# Patient Record
Sex: Female | Born: 1985 | Race: Asian | Hispanic: No | Marital: Single | Smoking: Never smoker
Health system: Southern US, Community
[De-identification: ages and names within clinical notes are randomized; demographics above are authoritative.]

## PROBLEM LIST (undated history)

## (undated) VITALS — BP 109/68 | HR 89 | Temp 97.9°F | Resp 16 | Ht 60.0 in | Wt 98.0 lb

## (undated) DIAGNOSIS — R3989 Other symptoms and signs involving the genitourinary system: Secondary | ICD-10-CM

## (undated) DIAGNOSIS — R300 Dysuria: Secondary | ICD-10-CM

## (undated) DIAGNOSIS — Z8659 Personal history of other mental and behavioral disorders: Secondary | ICD-10-CM

## (undated) DIAGNOSIS — F419 Anxiety disorder, unspecified: Secondary | ICD-10-CM

## (undated) DIAGNOSIS — F329 Major depressive disorder, single episode, unspecified: Secondary | ICD-10-CM

## (undated) DIAGNOSIS — N390 Urinary tract infection, site not specified: Secondary | ICD-10-CM

## (undated) DIAGNOSIS — B379 Candidiasis, unspecified: Secondary | ICD-10-CM

## (undated) DIAGNOSIS — Z8742 Personal history of other diseases of the female genital tract: Secondary | ICD-10-CM

## (undated) DIAGNOSIS — E559 Vitamin D deficiency, unspecified: Secondary | ICD-10-CM

## (undated) DIAGNOSIS — Z8719 Personal history of other diseases of the digestive system: Secondary | ICD-10-CM

## (undated) DIAGNOSIS — K219 Gastro-esophageal reflux disease without esophagitis: Secondary | ICD-10-CM

## (undated) DIAGNOSIS — R3915 Urgency of urination: Secondary | ICD-10-CM

## (undated) DIAGNOSIS — IMO0002 Reserved for concepts with insufficient information to code with codable children: Secondary | ICD-10-CM

## (undated) HISTORY — DX: Gastro-esophageal reflux disease without esophagitis: K21.9

## (undated) HISTORY — DX: Vitamin D deficiency, unspecified: E55.9

---

## 2012-07-23 ENCOUNTER — Encounter (HOSPITAL_COMMUNITY): Payer: Self-pay | Admitting: Emergency Medicine

## 2012-07-23 ENCOUNTER — Emergency Department (HOSPITAL_COMMUNITY)
Admission: EM | Admit: 2012-07-23 | Discharge: 2012-07-24 | Disposition: A | Discharge status: Home / Self Care | Payer: BC Managed Care – PPO | Attending: Emergency Medicine | Admitting: Emergency Medicine

## 2012-07-23 DIAGNOSIS — F329 Major depressive disorder, single episode, unspecified: Secondary | ICD-10-CM | POA: Insufficient documentation

## 2012-07-23 DIAGNOSIS — R45851 Suicidal ideations: Secondary | ICD-10-CM | POA: Insufficient documentation

## 2012-07-23 DIAGNOSIS — F3289 Other specified depressive episodes: Secondary | ICD-10-CM | POA: Insufficient documentation

## 2012-07-23 HISTORY — DX: Anxiety disorder, unspecified: F41.9

## 2012-07-23 LAB — CBC
MCH: 30.2 pg (ref 26.0–34.0)
MCHC: 33.8 g/dL (ref 30.0–36.0)
MCV: 89.3 fL (ref 78.0–100.0)
Platelets: 268 10*3/uL (ref 150–400)
RDW: 12.1 % (ref 11.5–15.5)

## 2012-07-23 LAB — COMPREHENSIVE METABOLIC PANEL
AST: 14 U/L (ref 0–37)
Albumin: 4.5 g/dL (ref 3.5–5.2)
CO2: 25 mEq/L (ref 19–32)
Calcium: 9.7 mg/dL (ref 8.4–10.5)
Creatinine, Ser: 0.56 mg/dL (ref 0.50–1.10)
GFR calc non Af Amer: >90 mL/min (ref >90)
Sodium: 137 mEq/L (ref 135–145)
Total Protein: 7.8 g/dL (ref 6.0–8.3)

## 2012-07-23 LAB — RAPID URINE DRUG SCREEN, HOSP PERFORMED
Amphetamines: NOT DETECTED
Cocaine: NOT DETECTED
Opiates: NOT DETECTED

## 2012-07-23 LAB — SALICYLATE LEVEL: Salicylate Lvl: <2 mg/dL — ABNORMAL LOW (ref 2.8–20.0)

## 2012-07-23 MED ORDER — ZOLPIDEM TARTRATE 5 MG PO TABS: 5.0000 mg | ORAL_TABLET | Freq: Every evening | ORAL | Status: DC | PRN

## 2012-07-23 MED ORDER — ACETAMINOPHEN 325 MG PO TABS: 650.0000 mg | ORAL_TABLET | ORAL | Status: DC | PRN

## 2012-07-23 MED ORDER — LORAZEPAM 1 MG PO TABS: 1.0000 mg | ORAL_TABLET | Freq: Three times a day (TID) | ORAL | Status: DC | PRN

## 2012-07-23 MED ORDER — ALUM & MAG HYDROXIDE-SIMETH 200-200-20 MG/5ML PO SUSP: 30.0000 mL | ORAL | Status: DC | PRN

## 2012-07-23 MED ORDER — ONDANSETRON HCL 8 MG PO TABS: 4.0000 mg | ORAL_TABLET | Freq: Three times a day (TID) | ORAL | Status: DC | PRN

## 2012-07-23 NOTE — ED Notes (Signed)
Charge RN and [facility] notified that pt is SI; AC states she does not have sitter currently, but would probably have one at 11pm

## 2012-07-23 NOTE — ED Notes (Signed)
Spoke with Kathline Magic PA about pt; okay'ed pt to go to [facility] for screening

## 2012-07-23 NOTE — ED Notes (Signed)
Pt in blue scrubs; security paged to wand pt

## 2012-07-23 NOTE — BH Assessment (Signed)
Assessment Note   Dawn Price is an 26 y.o. female. Pt is from [place] and is a Gaffer at [employer].  Pt has been in [place] for 2 years, cannot return to her homeland until she graduates in 2016.  Pt reports her boyfriend broke up with her recently.  Also states she has been very homesick recently--much worse than when she first arrived.  Pt states things have not been going well for about a month and she has been having SI for past 2 weeks.  Trouble eating and sleeping.  Pt reports she had a bottle of advil today and was intending to take the whole bottle to kill herself.  She took 2 pills and then decided she would go talk to her boyfriend once more.  boyffriend was at work but she told a friend, who took her to [facility], who sent her to [facility] ED.  Pt also reports she has had impulses to cut herself recently but has not acted on them.  PT was on prozac throught [employer] but stopped taking them in June.  Pt denies HI/AV.  Axis I: Major Depression, single episode Axis II: Deferred Axis III:  Past Medical History  Diagnosis Date  . Anxiety    Axis IV: problems with primary support group Axis V: 31-40 impairment in reality testing  Past Medical History:  Past Medical History  Diagnosis Date  . Anxiety     History reviewed. No pertinent past surgical history.  Family History: History reviewed. No pertinent family history.  Social History:  reports that she has never smoked. She does not have any smokeless tobacco history on file. She reports that she drinks alcohol. She reports that she does not use illicit drugs.  Additional Social History:  Alcohol / Drug Use Pain Medications: Pt denies Prescriptions: Pt denies Over the Counter: Pt denies History of alcohol / drug use?: Yes Substance #1 Name of Substance 1: alcohol 1 - Age of First Use: 26 1 - Amount (size/oz): 1 drink 1 - Frequency: most weekends 1 - Last Use / Amount: 9/28, 1 drink  CIWA: CIWA-Ar BP: 109/66  mmHg Pulse Rate: 72  COWS:    Allergies: No Known Allergies  Home Medications:  (Not in a hospital admission)  OB/GYN Status:  Patient's last menstrual period was 06/21/2012.  General Assessment Data Location of Assessment: [facility] ED ACT Assessment: Yes Living Arrangements: Alone Can pt return to current living arrangement?: Yes Admission Status: Voluntary Is patient capable of signing voluntary admission?: Yes Transfer from: Acute Hospital  Education Status Is patient currently in school?: Yes Current Grade: IT consultant Name of school: [employer]  Risk to self Suicidal Ideation: Yes-Currently Present Suicidal Intent: Yes-Currently Present Is patient at risk for suicide?: Yes Suicidal Plan?: Yes-Currently Present Specify Current Suicidal Plan: OD Access to Means: Yes Specify Access to Suicidal Means: own pills What has been your use of drugs/alcohol within the last 12 months?: Current use of alcohol Previous Attempts/Gestures: No Intentional Self Injurious Behavior: None (Pt reports current thoughts of cutting, no hx of cutting) Family Suicide History: No Recent stressful life event(s): Loss (Comment) (boyfriend broke up with her recently, homesick) Persecutory voices/beliefs?: No Depression: Yes Depression Symptoms: Despondent;Insomnia;Tearfulness;Isolating;Fatigue;Guilt;Loss of interest in usual pleasures;Feeling worthless/self pity Substance abuse history and/or treatment for substance abuse?: No Suicide prevention information given to non-admitted patients: Not applicable  Risk to Others Homicidal Ideation: No Thoughts of Harm to Others: No Current Homicidal Intent: No Current Homicidal Plan: No Access to Homicidal  Means: No History of harm to others?: No Assessment of Violence: None Noted Does patient have access to weapons?: No Criminal Charges Pending?: No Does patient have a court date: No  Psychosis Hallucinations: None noted Delusions: None noted  Mental  Status Report Appear/Hygiene: Other (Comment) (casual) Eye Contact: Good Motor Activity: Unremarkable Speech: Logical/coherent Level of Consciousness: Alert;Quiet/awake Mood: Depressed Affect: Appropriate to circumstance Anxiety Level: Minimal Thought Processes: Coherent;Relevant Judgement: Unimpaired Orientation: Person;Place;Time;Situation Obsessive Compulsive Thoughts/Behaviors: None  Cognitive Functioning Concentration: Normal Memory: Recent Intact;Remote Intact IQ: Above Average Insight: Good Impulse Control: Fair Appetite: Poor Weight Loss: 6  Weight Gain: 0  Sleep: Decreased Total Hours of Sleep: 5  Vegetative Symptoms: None  ADLScreening [facility] Assessment Services) Patient's cognitive ability adequate to safely complete daily activities?: Yes Patient able to express need for assistance with ADLs?: Yes Independently performs ADLs?: Yes (appropriate for developmental age)  Abuse/Neglect [facility]) Physical Abuse: Denies Verbal Abuse: Denies Sexual Abuse: Denies  Prior Inpatient Therapy Prior Inpatient Therapy: No  Prior Outpatient Therapy Prior Outpatient Therapy: Yes Prior Therapy Dates: 2013 Prior Therapy Facilty/Provider(s): [facility] Reason for Treatment: depression  ADL Screening (condition at time of admission) Patient's cognitive ability adequate to safely complete daily activities?: Yes Patient able to express need for assistance with ADLs?: Yes Independently performs ADLs?: Yes (appropriate for developmental age) Weakness of Legs: None Weakness of Arms/Hands: None  Home Assistive Devices/Equipment Home Assistive Devices/Equipment: None    Abuse/Neglect Assessment (Assessment to be complete while patient is alone) Physical Abuse: Denies Verbal Abuse: Denies Sexual Abuse: Denies Exploitation of patient/patient's resources: Denies Self-Neglect: Denies     Merchant navy officer (For Healthcare) Advance Directive: Patient does not have advance  directive;Patient would not like information    Additional Information 1:1 In Past 12 Months?: No CIRT Risk: No Elopement Risk: No Does patient have medical clearance?: Yes     Disposition: Pt referred for inpt psych treatment to [facility]. Disposition Disposition of Patient: Inpatient treatment program Type of inpatient treatment program: Adult  On Site Evaluation by:   Reviewed with Physician:     Lorri Frederick 07/23/2012 11:06 PM

## 2012-07-23 NOTE — ED Notes (Addendum)
Pt reports SI thoughts today, took 2 advil pills to try and harm herself; stopped at the 2; went to [facility] health services and sent her here

## 2012-07-23 NOTE — ED Provider Notes (Signed)
History     CSN: [id number]  Arrival date & time 07/23/12  4098   First MD Initiated Contact with Patient 07/23/12 2055      Chief Complaint  Patient presents with  . Suicidal   HPI  History provided by the patient. Patient is a 26 year old female who presents with increased depression and suicidal ideations. 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 Patient reports having increased depression for the past one month after she separated from her boyfriend. Patient reports having increased aggression and sadness. Today she was is overwhelmed with stress and sadness and had strong suicidal thoughts. Patient reports that she plan to take a whole bottle of Advil and began to take this but stopped after taking 2 pills and called her friend. Patient does report prior history of anxiety and previously being on Prozac but she discontinued this on her own last May or June because she felt well.    Past Medical History  Diagnosis Date  . Anxiety     History reviewed. No pertinent past surgical history.  History reviewed. No pertinent family history.  History  Substance Use Topics  . Smoking status: Never Smoker   . Smokeless tobacco: Not on file  . Alcohol Use: Yes     occasion    OB History    Grav Para Term Preterm Abortions TAB SAB Ect Mult Living                  Review of Systems  Constitutional: Negative for fever, chills and diaphoresis.  Respiratory: Negative for cough and shortness of breath.   Cardiovascular: Negative for chest pain and palpitations.  Psychiatric/Behavioral: Positive for suicidal ideas and dysphoric mood. Negative for hallucinations. The patient is not nervous/anxious.     Allergies  Review of patient's allergies indicates no known allergies.  Home Medications   Current Outpatient Rx  Name Route Sig Dispense Refill  . ADULT MULTIVITAMIN W/MINERALS CH Oral Take 1 tablet by mouth daily.      BP  109/66  Pulse 72  Temp 98.5 F (36.9 C) (Oral)  Resp 18  SpO2 98%  LMP 06/21/2012  Physical Exam  Nursing note and vitals reviewed. Constitutional: She is oriented to person, place, and time. She appears well-developed and well-nourished. No distress.  HENT:  Head: Normocephalic and atraumatic.  Cardiovascular: Normal rate and regular rhythm.   Pulmonary/Chest: Effort normal and breath sounds normal. No respiratory distress. She has no wheezes. She has no rales.  Abdominal: Soft. There is no tenderness. There is no rebound.  Musculoskeletal: She exhibits no edema and no tenderness.  Neurological: She is alert and oriented to person, place, and time.  Skin: Skin is warm and dry.  Psychiatric: Her behavior is normal. She exhibits a depressed mood. She expresses suicidal ideation. She expresses no homicidal ideation.    ED Course  Procedures    Results for orders placed during the hospital encounter of 07/23/12  ACETAMINOPHEN LEVEL      Component Value Range   Acetaminophen (Tylenol), Serum <15.0  10 - 30 ug/mL  CBC      Component Value Range   WBC 7.2  4.0 - 10.5 K/uL   RBC 4.60  3.87 - 5.11 MIL/uL   Hemoglobin 13.9  12.0 - 15.0 g/dL   HCT 11.9  14.7 - 82.9 %   MCV 89.3  78.0 - 100.0 fL   MCH 30.2  26.0 -  34.0 pg   MCHC 33.8  30.0 - 36.0 g/dL   RDW 16.1  09.6 - 04.5 %   Platelets 268  150 - 400 K/uL  COMPREHENSIVE METABOLIC PANEL      Component Value Range   Sodium 137  135 - 145 mEq/L   Potassium 3.8  3.5 - 5.1 mEq/L   Chloride 100  96 - 112 mEq/L   CO2 25  19 - 32 mEq/L   Glucose, Bld 91  70 - 99 mg/dL   BUN 9  6 - 23 mg/dL   Creatinine, Ser 4.09  0.50 - 1.10 mg/dL   Calcium 9.7  8.4 - 81.1 mg/dL   Total Protein 7.8  6.0 - 8.3 g/dL   Albumin 4.5  3.5 - 5.2 g/dL   AST 14  0 - 37 U/L   ALT 13  0 - 35 U/L   Alkaline Phosphatase 66  39 - 117 U/L   Total Bilirubin 1.1  0.3 - 1.2 mg/dL   GFR calc non Af Amer >90  >90 mL/min   GFR calc Af Amer >90  >90 mL/min    ETHANOL      Component Value Range   Alcohol, Ethyl (B) <11  0 - 11 mg/dL  SALICYLATE LEVEL      Component Value Range   Salicylate Lvl <2.0 (*) 2.8 - 20.0 mg/dL  URINE RAPID DRUG SCREEN (HOSP PERFORMED)      Component Value Range   Opiates NONE DETECTED  NONE DETECTED   Cocaine NONE DETECTED  NONE DETECTED   Benzodiazepines NONE DETECTED  NONE DETECTED   Amphetamines NONE DETECTED  NONE DETECTED   Tetrahydrocannabinol NONE DETECTED  NONE DETECTED   Barbiturates NONE DETECTED  NONE DETECTED  POCT PREGNANCY, URINE      Component Value Range   Preg Test, Ur NEGATIVE  NEGATIVE     1. Suicidal ideation   2. Depression       MDM  10:15 PM patient seen and evaluated. Patient continues to feel depressed and suicidal ideation.  Pt moved to pod C. Psych holding orders in place.  Spoke with Tammy Sours with [facility] act team.  He will evaluated for placement.  Pt is voluntary.       Angus Seller, [place] 07/24/12 [phone]

## 2012-07-24 ENCOUNTER — Ambulatory Visit (HOSPITAL_COMMUNITY): Admission: AD | Admit: 2012-07-24 | Payer: Self-pay | Source: Ambulatory Visit | Admitting: Psychiatry

## 2012-07-24 NOTE — ED Notes (Signed)
Report called to [facility].

## 2012-07-24 NOTE — ED Notes (Signed)
[facility] called for transport.  

## 2012-07-24 NOTE — ED Notes (Signed)
[facility] here to pick up pt.  All of pt.'s belongings giving to [facility] for Transport to [facility].

## 2012-07-24 NOTE — ED Provider Notes (Signed)
Medical screening examination/treatment/procedure(s) were performed by non-physician practitioner and as supervising physician I was immediately available for consultation/collaboration.  Charles B. Sheldon, MD 07/24/12 1349 

## 2012-07-24 NOTE — [facility] Counselor (Signed)
Patient accepted by D. Sharpe NP @ [facility] pending bed availability. 

## 2013-04-20 ENCOUNTER — Emergency Department (HOSPITAL_COMMUNITY)
Admission: EM | Admit: 2013-04-20 | Discharge: 2013-04-20 | Disposition: A | Discharge status: Home / Self Care | Payer: BC Managed Care – PPO | Source: Home / Self Care

## 2013-04-20 ENCOUNTER — Encounter (HOSPITAL_COMMUNITY): Payer: Self-pay

## 2013-04-20 DIAGNOSIS — R21 Rash and other nonspecific skin eruption: Secondary | ICD-10-CM

## 2013-04-20 NOTE — ED Provider Notes (Signed)
   History    CSN: [id number] Arrival date & time 04/20/13  1523  First MD Initiated Contact with Patient 04/20/13 1751     Chief Complaint  Patient presents with  . Rash   (Consider location/radiation/quality/duration/timing/severity/associated sxs/prior Treatment) HPI Comments: 27 year old female presents with concerns of small flesh-colored papules on small portions of her torso and extremities. She states there is no soreness or itching. They began approximately 6 days ago. 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 She denies any known exposure she was wearing personnel protection equipment and double blood. There was no accident in the laboratory. She has no other feelings of illness. She has no fever, chills or other constitutional symptoms. No GI symptoms no chest pain, shortness of breath, lack of energy, fatigue or malaise. She says she feels well.  Past Medical History  Diagnosis Date  . Anxiety    History reviewed. No pertinent past surgical history. History reviewed. No pertinent family history. History  Substance Use Topics  . Smoking status: Never Smoker   . Smokeless tobacco: Not on file  . Alcohol Use: Yes     Comment: occasion   OB History   Grav Para Term Preterm Abortions TAB SAB Ect Mult Living                 Review of Systems  All other systems reviewed and are negative.    Allergies  Review of patient's allergies indicates no known allergies.  Home Medications   Current Outpatient Rx  Name  Route  Sig  Dispense  Refill  . Multiple Vitamin (MULTIVITAMIN WITH MINERALS) TABS   Oral   Take 1 tablet by mouth daily.          BP 118/71  Pulse 77  Temp(Src) 98.3 F (36.8 C) (Oral)  Resp 16  SpO2 100% Physical Exam  Nursing note and vitals reviewed. Constitutional: She is oriented to person, place, and time. She appears well-developed and well-nourished. No distress.  Eyes: Conjunctivae and EOM are  normal.  Neck: Normal range of motion. Neck supple.  Cardiovascular: Normal rate and regular rhythm.   Pulmonary/Chest: Effort normal and breath sounds normal.  Musculoskeletal: She exhibits no edema and no tenderness.  Neurological: She is alert and oriented to person, place, and time. She exhibits normal muscle tone.  Skin: Skin is warm and dry.  1-2 mm flesh-colored to slightly red papules scattered to the upper extremities and torso. Very low density in numbers of lesions. Nontender. No evidence of infection.  Psychiatric: She has a normal mood and affect.    ED Course  Procedures (including critical care time) Labs Reviewed - No data to display No results found. 1. Rash and nonspecific skin eruption     MDM  The patient is completely asymptomatic and feels well in general only complaint is the tiny 1 mm papules scattered in small areas of the trunk and lesser to the extremities. Tried to reassure her that this was most likely not due to a serious condition, although I am uncertain as to the etiology. One possibility is that it is a type of viral exanthem without additional clinical symptoms. If she develops increasing rash or associated symptoms such as fever, chills, myalgias or other symptoms may return.  Hayden Rasmussen, NP 04/20/13 2109

## 2013-04-20 NOTE — ED Notes (Signed)
States she has a generalized rash on legs, arms, trunk

## 2013-04-20 NOTE — ED Provider Notes (Signed)
Medical screening examination/treatment/procedure(s) were performed by non-physician practitioner and as supervising physician I was immediately available for consultation/collaboration.  Taden Witter, M.D.  Kamorah Nevils C Jordynn Perrier, MD 04/20/13 2132 

## 2014-05-13 ENCOUNTER — Ambulatory Visit (INDEPENDENT_AMBULATORY_CARE_PROVIDER_SITE_OTHER): Payer: BC Managed Care – PPO | Admitting: Family Medicine

## 2014-05-13 VITALS — BP 102/68 | HR 71 | Temp 98.2°F | Resp 16 | Ht 59.5 in | Wt 90.0 lb

## 2014-05-13 DIAGNOSIS — R634 Abnormal weight loss: Secondary | ICD-10-CM

## 2014-05-13 DIAGNOSIS — G4700 Insomnia, unspecified: Secondary | ICD-10-CM

## 2014-05-13 NOTE — Patient Instructions (Addendum)
Try to get more regular exercise  Try to eat more calories  Try to get some sun.  Avoid the evening tea.  I will let you know the results of the lab in a few days or week  Return as necessary  Insomnia Insomnia is frequent trouble falling and/or staying asleep. Insomnia can be a long term problem or a short term problem. Both are common. Insomnia can be a short term problem when the wakefulness is related to a certain stress or worry. Long term insomnia is often related to ongoing stress during waking hours and/or poor sleeping habits. Overtime, sleep deprivation itself can make the problem worse. Every little thing feels more severe because you are overtired and your ability to cope is decreased. CAUSES   Stress, anxiety, and depression.  Poor sleeping habits.  Distractions such as TV in the bedroom.  Naps close to bedtime.  Engaging in emotionally charged conversations before bed.  Technical reading before sleep.  Alcohol and other sedatives. They may make the problem worse. They can hurt normal sleep patterns and normal dream activity.  Stimulants such as caffeine for several hours prior to bedtime.  Pain syndromes and shortness of breath can cause insomnia.  Exercise late at night.  Changing time zones may cause sleeping problems (jet lag). It is sometimes helpful to have someone observe your sleeping patterns. They should look for periods of not breathing during the night (sleep apnea). They should also look to see how long those periods last. If you live alone or observers are uncertain, you can also be observed at a sleep clinic where your sleep patterns will be professionally monitored. Sleep apnea requires a checkup and treatment. Give your caregivers your medical history. Give your caregivers observations your family has made about your sleep.  SYMPTOMS   Not feeling rested in the morning.  Anxiety and restlessness at bedtime.  Difficulty falling and staying  asleep. TREATMENT   Your caregiver may prescribe treatment for an underlying medical disorders. Your caregiver can give advice or help if you are using alcohol or other drugs for self-medication. Treatment of underlying problems will usually eliminate insomnia problems.  Medications can be prescribed for short time use. They are generally not recommended for lengthy use.  Over-the-counter sleep medicines are not recommended for lengthy use. They can be habit forming.  You can promote easier sleeping by making lifestyle changes such as:  Using relaxation techniques that help with breathing and reduce muscle tension.  Exercising earlier in the day.  Changing your diet and the time of your last meal. No night time snacks.  Establish a regular time to go to bed.  Counseling can help with stressful problems and worry.  Soothing music and white noise may be helpful if there are background noises you cannot remove.  Stop tedious detailed work at least one hour before bedtime. HOME CARE INSTRUCTIONS   Keep a diary. Inform your caregiver about your progress. This includes any medication side effects. See your caregiver regularly. Take note of:  Times when you are asleep.  Times when you are awake during the night.  The quality of your sleep.  How you feel the next day. This information will help your caregiver care for you.  Get out of bed if you are still awake after 15 minutes. Read or do some quiet activity. Keep the lights down. Wait until you feel sleepy and go back to bed.  Keep regular sleeping and waking hours. Avoid naps.  Exercise  regularly.  Avoid distractions at bedtime. Distractions include watching television or engaging in any intense or detailed activity like attempting to balance the household checkbook.  Develop a bedtime ritual. Keep a familiar routine of bathing, brushing your teeth, climbing into bed at the same time each night, listening to soothing music.  Routines increase the success of falling to sleep faster.  Use relaxation techniques. This can be using breathing and muscle tension release routines. It can also include visualizing peaceful scenes. You can also help control troubling or intruding thoughts by keeping your mind occupied with boring or repetitive thoughts like the old concept of counting sheep. You can make it more creative like imagining planting one beautiful flower after another in your backyard garden.  During your day, work to eliminate stress. When this is not possible use some of the previous suggestions to help reduce the anxiety that accompanies stressful situations. MAKE SURE YOU:   Understand these instructions.  Will watch your condition.  Will get help right away if you are not doing well or get worse. Document Released: 10/08/2000 Document Revised: 01/03/2012 Document Reviewed: 11/08/2007 [facility] Patient Information 2015 [place], [place]. This information is not intended to replace advice given to you by your health care provider. Make sure you discuss any questions you have with your health care provider.

## 2014-05-13 NOTE — Progress Notes (Signed)
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 The school clinic as for her thyroid test was a little elevated. She is here for recheck of that. She preferred to come to a private physician writhing going back to the school clinic. She also has lost some weight. She used to weigh 79, now is down to 90 pounds. She doesn't have as good an appetite as she should she says. She has not missed well. She goes to bed about 1 or 2 doesn't go to sleep until 3 or 4. Does not get any exercise. Does not get any sun. Was told that her vitamin D level was low about 3 months ago and was given some vitamin D. She takes multivitamin, no other regular medications. She is no longer sexually involved.last menstrual cycle was 4 weeks ago.  Objective Pleasant young lady, slender but short statured. TMs are normal. Eyes normal. Throat clear. Neck supple without nodes. No thyromegaly. Chest clear to auscultation. Heart regular without murmurs gallops or arrhythmias. No carotid bruits. Abdomen soft without organomegaly mass or tenderness. Extremities unremarkable.  Assessment: Insomnia Weight loss  Plan: Thyroid panel and C. met

## 2014-05-14 ENCOUNTER — Encounter: Payer: Self-pay | Admitting: Family Medicine

## 2014-05-14 LAB — COMPREHENSIVE METABOLIC PANEL
ALK PHOS: 67 U/L (ref 39–117)
ALT: 66 U/L — AB (ref 0–35)
AST: 34 U/L (ref 0–37)
Albumin: 4.7 g/dL (ref 3.5–5.2)
BUN: 9 mg/dL (ref 6–23)
CALCIUM: 9.5 mg/dL (ref 8.4–10.5)
CHLORIDE: 106 meq/L (ref 96–112)
CO2: 23 mEq/L (ref 19–32)
CREATININE: 0.52 mg/dL (ref 0.50–1.10)
Glucose, Bld: 81 mg/dL (ref 70–99)
POTASSIUM: 4.5 meq/L (ref 3.5–5.3)
Sodium: 139 mEq/L (ref 135–145)
Total Bilirubin: 0.8 mg/dL (ref 0.2–1.2)
Total Protein: 7.3 g/dL (ref 6.0–8.3)

## 2014-05-14 LAB — THYROID PANEL WITH TSH
FREE THYROXINE INDEX: 3.2 (ref 1.0–3.9)
T3 Uptake: 32.3 % (ref 22.5–37.0)
T4, Total: 9.8 ug/dL (ref 5.0–12.5)
TSH: 1.916 u[IU]/mL (ref 0.350–4.500)

## 2014-06-24 ENCOUNTER — Encounter (HOSPITAL_COMMUNITY): Payer: Self-pay

## 2014-06-24 ENCOUNTER — Inpatient Hospital Stay (HOSPITAL_COMMUNITY)
Admission: AD | Admit: 2014-06-24 | Discharge: 2014-06-26 | DRG: 885 | Disposition: A | Discharge status: Home / Self Care | Payer: BC Managed Care – PPO | Attending: Psychiatry | Admitting: Psychiatry

## 2014-06-24 DIAGNOSIS — Z609 Problem related to social environment, unspecified: Secondary | ICD-10-CM

## 2014-06-24 DIAGNOSIS — F411 Generalized anxiety disorder: Secondary | ICD-10-CM | POA: Diagnosis present

## 2014-06-24 DIAGNOSIS — F332 Major depressive disorder, recurrent severe without psychotic features: Principal | ICD-10-CM

## 2014-06-24 DIAGNOSIS — G4700 Insomnia, unspecified: Secondary | ICD-10-CM | POA: Diagnosis present

## 2014-06-24 DIAGNOSIS — R45851 Suicidal ideations: Secondary | ICD-10-CM

## 2014-06-24 DIAGNOSIS — F329 Major depressive disorder, single episode, unspecified: Secondary | ICD-10-CM | POA: Diagnosis present

## 2014-06-24 HISTORY — DX: Major depressive disorder, single episode, unspecified: F32.9

## 2014-06-24 LAB — URINE MICROSCOPIC-ADD ON

## 2014-06-24 LAB — URINALYSIS, ROUTINE W REFLEX MICROSCOPIC
BILIRUBIN URINE: NEGATIVE
Glucose, UA: NEGATIVE mg/dL
Hgb urine dipstick: NEGATIVE
Ketones, ur: NEGATIVE mg/dL
NITRITE: NEGATIVE
PH: 7.5 (ref 5.0–8.0)
PROTEIN: NEGATIVE mg/dL
Specific Gravity, Urine: 1.022 (ref 1.005–1.030)
Urobilinogen, UA: 0.2 mg/dL (ref 0.0–1.0)

## 2014-06-24 LAB — RAPID URINE DRUG SCREEN, HOSP PERFORMED
Amphetamines: NOT DETECTED
Barbiturates: NOT DETECTED
Benzodiazepines: NOT DETECTED
Cocaine: NOT DETECTED
OPIATES: NOT DETECTED
Tetrahydrocannabinol: NOT DETECTED

## 2014-06-24 LAB — BASIC METABOLIC PANEL
ANION GAP: 14 (ref 5–15)
BUN: 11 mg/dL (ref 6–23)
CALCIUM: 9.6 mg/dL (ref 8.4–10.5)
CHLORIDE: 103 meq/L (ref 96–112)
CO2: 22 mEq/L (ref 19–32)
CREATININE: 0.63 mg/dL (ref 0.50–1.10)
Glucose, Bld: 107 mg/dL — ABNORMAL HIGH (ref 70–99)
Potassium: 4.3 mEq/L (ref 3.7–5.3)
Sodium: 139 mEq/L (ref 137–147)

## 2014-06-24 LAB — PREGNANCY, URINE: Preg Test, Ur: NEGATIVE

## 2014-06-24 LAB — HEPATIC FUNCTION PANEL
ALBUMIN: 4.4 g/dL (ref 3.5–5.2)
ALK PHOS: 66 U/L (ref 39–117)
ALT: 10 U/L (ref 0–35)
AST: 13 U/L (ref 0–37)
BILIRUBIN TOTAL: 0.7 mg/dL (ref 0.3–1.2)
Bilirubin, Direct: <0.2 mg/dL (ref 0.0–0.3)
Total Protein: 7.7 g/dL (ref 6.0–8.3)

## 2014-06-24 LAB — CBC
HEMATOCRIT: 38 % (ref 36.0–46.0)
Hemoglobin: 13 g/dL (ref 12.0–15.0)
MCH: 30 pg (ref 26.0–34.0)
MCHC: 34.2 g/dL (ref 30.0–36.0)
MCV: 87.6 fL (ref 78.0–100.0)
PLATELETS: 283 10*3/uL (ref 150–400)
RBC: 4.34 MIL/uL (ref 3.87–5.11)
RDW: 11.8 % (ref 11.5–15.5)
WBC: 6.8 10*3/uL (ref 4.0–10.5)

## 2014-06-24 LAB — TSH: TSH: 1.03 u[IU]/mL (ref 0.350–4.500)

## 2014-06-24 MED ORDER — ACETAMINOPHEN 325 MG PO TABS
650.0000 mg | ORAL_TABLET | Freq: Four times a day (QID) | ORAL | Status: DC | PRN
  Administered 2014-06-25: 650 mg via ORAL
  Filled 2014-06-24: qty 2

## 2014-06-24 MED ORDER — FLUOXETINE HCL 20 MG PO CAPS
20.0000 mg | ORAL_CAPSULE | Freq: Every day | ORAL | Status: DC
  Administered 2014-06-24 – 2014-06-26 (×3): 20 mg via ORAL
  Filled 2014-06-24 (×4): qty 1

## 2014-06-24 MED ORDER — BOOST PLUS PO LIQD
237.0000 mL | Freq: Two times a day (BID) | ORAL | Status: DC
  Administered 2014-06-24: 237 mL via ORAL
  Administered 2014-06-25: 14:00:00 via ORAL
  Administered 2014-06-25: 237 mL via ORAL
  Filled 2014-06-24 (×5): qty 237

## 2014-06-24 MED ORDER — MAGNESIUM HYDROXIDE 400 MG/5ML PO SUSP: 30.0000 mL | Freq: Every day | ORAL | Status: DC | PRN

## 2014-06-24 MED ORDER — ALUM & MAG HYDROXIDE-SIMETH 200-200-20 MG/5ML PO SUSP
30.0000 mL | ORAL | Status: DC | PRN
Start: 2014-06-24 — End: 2014-06-26
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
## 2014-06-24 NOTE — Progress Notes (Signed)
Adult Psychoeducational Group Note  Date:  06/24/2014 Time:  10:55 PM  Group Topic/Focus:  Goals Group:   The focus of this group is to help patients establish daily goals to achieve during treatment and discuss how the patient can incorporate goal setting into their daily lives to aide in recovery.  Participation Level:  Active  Participation Quality:  Appropriate  Affect:  Appropriate  Cognitive:  Appropriate  Insight: Appropriate  Engagement in Group:  Engaged  Modes of Intervention:  Discussion  Additional Comments:  Pt stated that she had a good visit today and that made her day go by better.  Terie Purser R 06/24/2014, 10:55 PM
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
## 2014-06-24 NOTE — BHH Counselor (Signed)
Adult Comprehensive Assessment  Patient ID: Dawn Price, female   DOB: [birth year], 28 y.o.   MRN: [id number]  Information Source: Information source: Patient  Current Stressors:  Educational / Learning stressors: N/A Employment / Job issues: N/A Family Relationships: N/A Surveyor, quantity / Lack of resources (include bankruptcy): N/A Housing / Lack of housing: N/A Physical health (include injuries & life threatening diseases): loss of appetite when stressed so experienced 8 lb weight loss in 2 months Social relationships: relationship issues with boyfriend identified as stressor Substance abuse: N/A Bereavement / Loss: N/A  Living/Environment/Situation:  Living Arrangements: Non-relatives/Friends (Has roommate) Living conditions (as described by patient or guardian): stable and supportive  How long has patient lived in current situation?: 5 years What is atmosphere in current home: Comfortable  Family History:  Marital status: Single Does patient have children?: No  Childhood History:  By whom was/is the patient raised?: Both parents Description of patient's relationship with caregiver when they were a child: Very close with both parents Patient's description of current relationship with people who raised him/her: Very close with both parents Does patient have siblings?: No Did patient suffer any verbal/emotional/physical/sexual abuse as a child?: No Did patient suffer from severe childhood neglect?: No Has patient ever been sexually abused/assaulted/raped as an adolescent or adult?: No Was the patient ever a victim of a crime or a disaster?: No Witnessed domestic violence?: No Has patient been effected by domestic violence as an adult?: No  Education:  Highest grade of school patient has completed: 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  How long has patient been employed?: 4 years  Patient's job has been impacted by current illness: Yes Describe how patient's job has been impacted: Difficulty concentrating and fatigue  What is the longest time patient has a held a job?: 4 years Where was the patient employed at that time?: Current position  Has patient ever been in the [employer]?: No Has patient ever served in combat?: No  Financial Resources:   Financial resources: Income from employment Does patient have a representative payee or guardian?: No  Alcohol/Substance Abuse:   What has been your use of drugs/alcohol within the last 12 months?: drinks alcohol approximatley twice a week- 1 glass of wine on average If attempted suicide, did drugs/alcohol play a role in this?: No Alcohol/Substance Abuse Treatment Hx: Denies past history If yes, describe treatment: N/A Has alcohol/substance abuse ever caused legal problems?: No  Social Support System:   Conservation officer, nature Support System: Production assistant, radio System: several friends, parents  Type of faith/religion: Buddhist How does patient's faith help to cope with current illness?: no  Leisure/Recreation:   Leisure and Hobbies: hang out with friends, listen to music  Strengths/Needs:   What things does the patient do well?: intelligent, articulate, hard worker  In what areas does patient struggle / problems for patient: hard time controlling emotions   Discharge Plan:   Does patient have access to transportation?: Yes Will patient be returning to same living situation after discharge?: Yes Currently receiving community mental health services: No If no, would patient like referral for services when  discharged?:  ([place] [employer]) Does patient have financial barriers  related to discharge medications?: No Patient description of barriers related to discharge medications: N/A  Summary/Recommendations:     Patient is a 28 year old female of [place] heritage with a diagnosis of Major Depressive Disorder. Patient lives in [place] with a roommate. Patient will benefit from crisis stabilization, medication evaluation, group therapy, and psycho education in addition to case management for discharge planning.   Dawn Price, [place] 06/24/2014

## 2014-06-24 NOTE — [facility] Group Notes (Addendum)
BHH LCSW Group Therapy 06/24/2014  1:15 pm  Type of Therapy: Group Therapy Participation Level: Active  Participation Quality: Attentive, Sharing and Supportive  Affect: Depressed and Flat  Cognitive: Alert and Oriented  Insight: Developing/Improving and Engaged  Engagement in Therapy: Developing/Improving and Engaged  Modes of Intervention: Clarification, Confrontation, Discussion, Education, Exploration,  Limit-setting, Orientation, Problem-solving, Rapport Building, Dance movement psychotherapist, Socialization and Support  Summary of Progress/Problems: Pt identified obstacles faced currently and processed barriers involved in overcoming these obstacles. Pt identified steps necessary for overcoming these obstacles and explored motivation (internal and external) for facing these difficulties head on. Pt further identified one area of concern in their lives and chose a goal to focus on for today. Patient shared that she does not feel that she has any particular obstacle to overcome but is wanting to follow up with medication management and therapy at discharge. CSW's provided patient with emotional support and encouragement.   [name], MSW, [employer] Clinical Social Worker [facility] [phone]
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
## 2014-06-24 NOTE — Tx Team (Signed)
Initial Interdisciplinary Treatment Plan   PATIENT STRESSORS: Loss of relationship with boyfriend. Recent break-up Traumatic event    PROBLEM LIST: Problem List/Patient Goals Date to be addressed Date deferred Reason deferred Estimated date of resolution  SI 06/24/2014     Depression 06/24/2014                 Goal- Patient reports that she "wants to get rid of her relationship with boyfriend."            Patient reports that she wants to talk to a professional about her current situation.                   DISCHARGE CRITERIA:  Ability to meet basic life and health needs Adequate post-discharge living arrangements Improved stabilization in mood, thinking, and/or behavior Need for constant or close observation no longer present  PRELIMINARY DISCHARGE PLAN: Attend PHP/IOP Return to previous living arrangement Return to previous work or school arrangements  PATIENT/FAMIILY INVOLVEMENT: This treatment plan has been presented to and reviewed with the patient, Dawn Price, and/or family member.  The patient and family have been given the opportunity to ask questions and make suggestions.  [name] 06/24/2014, 4:41 AM
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
## 2014-06-24 NOTE — BHH Group Notes (Signed)
   [facility] LCSW Aftercare Discharge Planning Group Note  06/24/2014  8:45 AM   Participation Quality: Alert, Appropriate and Oriented  Mood/Affect: Depressed and Flat  Depression Rating: 3  Anxiety Rating: 1  Thoughts of Suicide: Pt denies SI/HI  Will you contract for safety? Yes  Current AVH: Pt denies  Plan for Discharge/Comments: Pt attended discharge planning group and actively participated in group. CSW provided pt with today's workbook. Patient reports that she is feeling "good" this morning. She reports low levels of depression and anxiety this morning; denies SI/HI. She believes that she is currently hospitalized due to "a misunderstanding"- patient reports that she came to the hospital to be set up with outpatient providers and was hospitalized. CSW provided patient with support and educated patient on hospitalization process. Patient wants to follow up at [facility].  Transportation Means: Pt reports access to transportation  Supports: No supports mentioned at this time  [name], MSW, [employer] Clinical Social Worker [employer] [phone]

## 2014-06-24 NOTE — Progress Notes (Signed)
Patient ID: Dawn Price, female   DOB: [birth year], 28 y.o.   MRN: [id number] She has been in bed awake this AM.  Stated that she had not slept well last night. She spoke of wanting to be discharged today because of her classes. She said that she was was thinking about harming herself last night but did  had no a plan.  She say' s that she was alone and at times she gets depressed and tries to analyst   Her depression and she gets depressed and just wants to talk to someone.

## 2014-06-24 NOTE — BHH Suicide Risk Assessment (Signed)
B[facility] INPATIENT:  Family/Significant Other Suicide Prevention Education  Suicide Prevention Education:  Education Completed; friend Danne Harbor [phone],  (name of family member/significant other) has been identified by the patient as the family member/significant other with whom the patient will be residing, and identified as the person(s) who will aid the patient in the event of a mental health crisis (suicidal ideations/suicide attempt).  With written consent from the patient, the family member/significant other has been provided the following suicide prevention education, prior to the and/or following the discharge of the patient.  The suicide prevention education provided includes the following:  Suicide risk factors  Suicide prevention and interventions  National Suicide Hotline telephone number  [facility] assessment telephone number  [facility] Emergency Assistance 911  [facility] and/or Residential Mobile Crisis Unit telephone number  Request made of family/significant other to:  Remove weapons (e.g., guns, rifles, knives), all items previously/currently identified as safety concern.    Remove drugs/medications (over-the-counter, prescriptions, illicit drugs), all items previously/currently identified as a safety concern.  The family member/significant other verbalizes understanding of the suicide prevention education information provided.  The family member/significant other agrees to remove the items of safety concern listed above.  Kerim Statzer, [place] 06/24/2014, 3:39 PM

## 2014-06-25 DIAGNOSIS — F3289 Other specified depressive episodes: Secondary | ICD-10-CM

## 2014-06-25 DIAGNOSIS — F329 Major depressive disorder, single episode, unspecified: Secondary | ICD-10-CM

## 2014-06-25 MED ORDER — PHENAZOPYRIDINE HCL 100 MG PO TABS
100.0000 mg | ORAL_TABLET | Freq: Three times a day (TID) | ORAL | Status: DC | PRN
  Administered 2014-06-25: 100 mg via ORAL
  Filled 2014-06-25: qty 3
  Filled 2014-06-25: qty 1

## 2014-06-25 MED ORDER — CIPROFLOXACIN HCL 250 MG PO TABS: 250.0000 mg | ORAL_TABLET | Freq: Two times a day (BID) | ORAL | Status: DC

## 2014-06-25 MED ORDER — PHENAZOPYRIDINE HCL 100 MG PO TABS
100.0000 mg | ORAL_TABLET | Freq: Three times a day (TID) | ORAL | Status: DC
  Filled 2014-06-25 (×2): qty 1

## 2014-06-25 MED ORDER — CIPROFLOXACIN HCL 250 MG PO TABS
250.0000 mg | ORAL_TABLET | Freq: Two times a day (BID) | ORAL | Status: DC
  Administered 2014-06-25 – 2014-06-26 (×2): 250 mg via ORAL
  Filled 2014-06-25 (×4): qty 1

## 2014-06-25 NOTE — Progress Notes (Signed)
Patient ID: Dawn Price, female   DOB: 09/09/1986, 28 y.o.   MRN: [id number] She has been in bed most of the day stated that she had going to dinning room and ate today. She c/o chilling and of UTI symptoms. Temp. 98.4  R 18 B/P laying 104/45. She has been encouraged to drink fluids. ABT was started and tylenol  Given for discomfort.

## 2014-06-25 NOTE — Progress Notes (Signed)
Recreation Therapy Notes  Animal-Assisted Activity/Therapy (AAA/T) Program Checklist/Progress Notes Patient Eligibility Criteria Checklist & Daily Group note for Rec Tx Intervention  Date: 09.01.2015 Time: 2:45pm Location: [address] Dayroom   AAA/T Program Assumption of Risk Form signed by Patient/ or Parent Legal Guardian yes  Patient is free of allergies or sever asthma yes  Patient reports no fear of animals yes  Patient reports no history of cruelty to animals yes   Patient understands his/her participation is voluntary yes  Behavioral Response: Did not attend.   Dawn Price, LRT/CTRS        Eilah Common L 06/25/2014 5:08 PM 

## 2014-06-25 NOTE — Tx Team (Signed)
Interdisciplinary Treatment Plan Update (Adult) Date: 06/25/2014   Time Reviewed: 9:30 AM  Progress in Treatment: Attending groups: Yes Participating in groups: Yes Taking medication as prescribed: Yes Tolerating medication: Yes Family/Significant other contact made: Yes Patient understands diagnosis: Yes Discussing patient identified problems/goals with staff: Yes Medical problems stabilized or resolved: Yes Denies suicidal/homicidal ideation: Yes Issues/concerns per patient self-inventory: Yes Other:  New problem(s) identified: N/A  Discharge Plan or Barriers: CSW continuing to assess. Patient wanting to follow up with [facility] for therapy and medication management but no psychiatrist available there at his time. CSW will discuss medication management services with patient.    Reason for Continuation of Hospitalization:  Depression Anxiety Medication Stabilization   Comments: N/A  Estimated length of stay: 2-4 days  For review of initial/current patient goals, please see plan of care.  Attendees: Patient:    Family:    Physician: Dr. Jama Flavors; Dr. Dub Mikes 06/25/2014 9:30 AM  Nursing: Neill Loft, Orson Ape., RN 06/25/2014 9:30 AM  Clinical Social Worker: [name],  LCSWA 06/25/2014 9:30 AM  Other: Juline Patch, LCSW 06/25/2014 9:30 AM  Other: Leisa Lenz, Vesta Mixer Liaison 06/25/2014 9:30 AM  Other: Onnie Boer, Case Manager 06/25/2014 9:30 AM  Other: Serena Colonel, NP 06/25/2014 9:30 AM  Other:    Other:    Other:    Other:    Other:      Scribe for Treatment Team:  [name], MSW, [employer] [phone]

## 2014-06-25 NOTE — Progress Notes (Signed)
The focus of this group is to educate the patient on the purpose and policies of crisis stabilization and provide a format to answer questions about their admission.  The group details unit policies and expectations of patients while admitted. Patient attended this group. 

## 2014-06-25 NOTE — Progress Notes (Signed)
Adult Psychoeducational Group Note  Date:  06/25/2014 Time:  9:38 PM  Group Topic/Focus:  Wrap-Up Group:   The focus of this group is to help patients review their daily goal of treatment and discuss progress on daily workbooks.  Participation Level:  Did Not Attend  Participation Quality:  Drowsy  Affect:  Flat  Cognitive:  Lacking  Insight: None  Engagement in Group:  None  Modes of Intervention:  Support  Additional Comments:  Pt decided not to come to group... Pt decide to stay in her bedroom and sleep   Bobbyjoe Pabst R 06/25/2014, 9:38 PM

## 2014-06-25 NOTE — Progress Notes (Signed)
Pt reports she came in Sunday night with the impression that she was only going to meet with a psychiatrist and counselor.  She is upset that she has been admitted and says she needs to get back to her classes.  She says this causes her to feel anxious, but she does not want any medications.  She says she wants to get back on her depression medication, and feels she can manage this on an outpatient basis.  Writer discussed her situation with pt.  Pt did say that the MD said she would probably be discharged Tuesday since she is not SI/HI.  Pt is appropriate on the unit, although she did have to be encouraged to go to the evening group tonight.  Pt says she does not want a sleep aid tonight and feels she will be able to go to sleep without a problem.  Support and encouragement offered.  Safety maintained with q15 minute checks.

## 2014-06-25 NOTE — Progress Notes (Signed)
[employer] [facility] MD Progress Note  06/25/2014 10:34 AM Dawn Price  MRN:  [id number] Subjective:   " I am doing OK"- complains of some dysuria.  Objective:  Patient discussed with treatment team, and I have met patient , to include along with Education officer, museum. Patient is expressing " doing OK" and remains focused on being discharged soon. She denies any ongoing  Self injurious ideations or suicidal ideations, and states she is ready to " go back home and get on with life". Behavior on unit in good control- no self injurious behaviors or agitation. Active in milieu. Tolerating Prozac well thus far. As noted, patient initially tends to minimize  Symptoms and significance of symptoms which led to admission. With support and empathy, she gradually became  more insightful and stated she realizes " I am going to need therapy" and that " becoming so emotional when I think my boyfriend may be cheating is a weakness I have". States " I realize I don't do well with rejection". States she did have a visit with her boyfriend yesterday which went well and they are considering going for couples counseling as well. 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 Patient complains of some dysuria/ no chills/no fever/ no flank or back pain. U/A unremarkable except for some WBC- rare bacteria and nitrite negative. U Cx ordered and pending at this time.  Diagnosis:  Depression NOS   Total Time spent with patient: 25 minutes    ADL's:  Good   Sleep: fair   Appetite:  improving  Suicidal Ideation:  Denies  Homicidal Ideation:  Denies  AEB (as evidenced by):  Psychiatric Specialty Exam: Physical Exam  Review of Systems  Constitutional: Positive for weight loss. Negative for fever, chills and malaise/fatigue.  Respiratory: Negative for cough and shortness of breath.   Cardiovascular: Negative for chest pain.  Genitourinary: Positive for dysuria.   Psychiatric/Behavioral: Positive for depression. Negative for suicidal ideas, hallucinations and substance abuse.    Blood pressure 94/61, pulse 110, temperature 98.1 F (36.7 C), temperature source Oral, resp. rate 16, height 5' (1.524 m), weight 44.453 kg (98 lb).Body mass index is 19.14 kg/(m^2).  General Appearance: Well Groomed  Engineer, water::  Good  Speech:  Normal Rate  Volume:  Normal  Mood:  less depressed, more reactive  Affect:  more reactive affect  Thought Process:  Goal Directed and Linear  Orientation:  Full (Time, Place, and Person)  Thought Content:  no hallucinations, no delusions, less ruminative about relationship issues  Suicidal Thoughts:  No- at this time denies any suicidal or homicidal ideations, contracts for safety on unit  Homicidal Thoughts:  No  Memory:  NA  Judgement:  Fair  Insight:  Fair  Psychomotor Activity:  Normal  Concentration:  Good  Recall:  Good  Fund of Knowledge:Good  Language: Good  Akathisia:  Negative  Handed:  Right  AIMS (if indicated):     Assets:  Communication Skills Desire for Improvement Financial Resources/Insurance Physical Health Resilience Talents/Skills Vocational/Educational  Sleep:  Number of Hours: 5.75   Musculoskeletal: Strength & Muscle Tone: within normal limits Gait & Station: normal Patient leans: N/A  Current Medications: Current Facility-Administered Medications  Medication Dose Route Frequency Provider Last Rate Last Dose  . acetaminophen (TYLENOL) tablet 650 mg  650 mg Oral Q6H PRN Charlcie Cradle, MD      . alum & mag hydroxide-simeth (MAALOX/MYLANTA) 200-200-20 MG/5ML suspension 30 mL  30 mL Oral Q4H PRN Charlcie Cradle, MD      . FLUoxetine (PROZAC) capsule 20 mg  20 mg Oral Daily Neita Garnet, MD   20 mg at 06/25/14 0736  . lactose free nutrition (BOOST PLUS) liquid 237 mL  237 mL Oral BID BM Toribio Harbour, RD   237 mL at 06/24/14 1952  . magnesium hydroxide (MILK OF MAGNESIA) suspension 30  mL  30 mL Oral Daily PRN Charlcie Cradle, MD        Lab Results:  Results for orders placed during the hospital encounter of 06/24/14 (from the past 48 hour(s))  CBC     Status: None   Collection Time    06/24/14  6:15 AM      Result Value Ref Range   WBC 6.8  4.0 - 10.5 K/uL   RBC 4.34  3.87 - 5.11 MIL/uL   Hemoglobin 13.0  12.0 - 15.0 g/dL   HCT 38.0  36.0 - 46.0 %   MCV 87.6  78.0 - 100.0 fL   MCH 30.0  26.0 - 34.0 pg   MCHC 34.2  30.0 - 36.0 g/dL   RDW 11.8  11.5 - 15.5 %   Platelets 283  150 - 400 K/uL   Comment: Performed at [facility]     Status: Abnormal   Collection Time    06/24/14  6:15 AM      Result Value Ref Range   Sodium 139  137 - 147 mEq/L   Potassium 4.3  3.7 - 5.3 mEq/L   Chloride 103  96 - 112 mEq/L   CO2 22  19 - 32 mEq/L   Glucose, Bld 107 (*) 70 - 99 mg/dL   BUN 11  6 - 23 mg/dL   Creatinine, Ser 0.63  0.50 - 1.10 mg/dL   Calcium 9.6  8.4 - 10.5 mg/dL   GFR calc non Af Amer >90  >90 mL/min   GFR calc Af Amer >90  >90 mL/min   Comment: (NOTE)     The eGFR has been calculated using the CKD EPI equation.     This calculation has not been validated in all clinical situations.     eGFR's persistently <90 mL/min signify possible Chronic Kidney     Disease.   Anion gap 14  5 - 15   Comment: Performed at [facility]  TSH     Status: None   Collection Time    06/24/14  6:15 AM      Result Value Ref Range   TSH 1.030  0.350 - 4.500 uIU/mL   Comment: Performed at [facility]  HEPATIC FUNCTION PANEL     Status: None   Collection Time    06/24/14  6:15 AM      Result Value Ref Range   Total Protein 7.7  6.0 - 8.3 g/dL   Albumin 4.4  3.5 - 5.2 g/dL   AST 13  0 - 37 U/L   ALT 10  0 - 35 U/L   Alkaline Phosphatase 66  39 - 117 U/L   Total Bilirubin 0.7  0.3 - 1.2 mg/dL   Bilirubin, Direct <0.2  0.0 - 0.3 mg/dL   Indirect Bilirubin NOT CALCULATED  0.3 - 0.9 mg/dL   Comment: Performed  at [place], URINE     Status: None   Collection Time    06/24/14  6:14 PM  Result Value Ref Range   Preg Test, Ur NEGATIVE  NEGATIVE   Comment:            THE SENSITIVITY OF THIS     METHODOLOGY IS >20 mIU/mL.     Performed at [facility] MICROSCOPIC     Status: Abnormal   Collection Time    06/24/14  8:59 PM      Result Value Ref Range   Color, Urine YELLOW  YELLOW   APPearance CLEAR  CLEAR   Specific Gravity, Urine 1.022  1.005 - 1.030   pH 7.5  5.0 - 8.0   Glucose, UA NEGATIVE  NEGATIVE mg/dL   Hgb urine dipstick NEGATIVE  NEGATIVE   Bilirubin Urine NEGATIVE  NEGATIVE   Ketones, ur NEGATIVE  NEGATIVE mg/dL   Protein, ur NEGATIVE  NEGATIVE mg/dL   Urobilinogen, UA 0.2  0.0 - 1.0 mg/dL   Nitrite NEGATIVE  NEGATIVE   Leukocytes, UA SMALL (*) NEGATIVE   Comment: Performed at [facility]     Status: None   Collection Time    06/24/14  8:59 PM      Result Value Ref Range   Opiates NONE DETECTED  NONE DETECTED   Cocaine NONE DETECTED  NONE DETECTED   Benzodiazepines NONE DETECTED  NONE DETECTED   Amphetamines NONE DETECTED  NONE DETECTED   Tetrahydrocannabinol NONE DETECTED  NONE DETECTED   Barbiturates NONE DETECTED  NONE DETECTED   Comment:            DRUG SCREEN FOR MEDICAL PURPOSES     ONLY.  IF CONFIRMATION IS NEEDED     FOR ANY PURPOSE, NOTIFY LAB     WITHIN 5 DAYS.                LOWEST DETECTABLE LIMITS     FOR URINE DRUG SCREEN     Drug Class       Cutoff (ng/mL)     Amphetamine      1000     Barbiturate      200     Benzodiazepine   220     Tricyclics       254     Opiates          300     Cocaine          300     THC              50     Performed at [place] ON     Status: None   Collection Time    06/24/14  8:59 PM      Result Value Ref Range   Squamous  Epithelial / LPF RARE  RARE   WBC, UA 11-20  <3 WBC/hpf   Bacteria, UA RARE  RARE   Urine-Other MUCOUS PRESENT     Comment: Performed at [facility]    Physical Findings: AIMS: Facial and Oral Movements Muscles of Facial Expression: None, normal Lips and Perioral Area: None, normal Jaw: None, normal Tongue: None, normal,Extremity Movements Upper (arms, wrists, hands, fingers): None, normal Lower (legs, knees, ankles, toes): None, normal, Trunk Movements Neck, shoulders, hips: None, normal, Overall Severity Severity of abnormal movements (highest score from questions above): None, normal Incapacitation due to abnormal movements: None, normal Patient's awareness of abnormal movements (rate only patient's report): No  Awareness, Dental Status Current problems with teeth and/or dentures?: No Does patient usually wear dentures?: No  CIWA:    COWS:     Assessment: Patient is improving compared to admission, and at this time presents with fuller range of affect. Denies SI, and behavior on unit in good control. She is future oriented. Tends to minimize recent symptoms, but with support is able to discuss concern about  Vulnerability towards depression and sadness in the context of relationship conflict, perceived rejection. States she wants to go to therapy as an outpatient to address. Tolerating Prozac well thus far.  Treatment Plan Summary: Daily contact with patient to assess and evaluate symptoms and progress in treatment Medication management See below  Plan: Continue inpatient treatment/support/milieu Continue Prozac 20 mgrs QAM U CX pending Consider discharge soon as she continues to improve with referral for outpatient psychotherapy and medication management.  Medical Decision Making Problem Points:  Established problem, stable/improving (1), Review of last therapy session (1) and Review of psycho-social stressors (1) Data Points:  Review or order clinical  lab tests (1) Review of medication regiment & side effects (2)  I certify that inpatient services furnished can reasonably be expected to improve the patient's condition.   COBOS, Wilder 06/25/2014, 10:34 AM

## 2014-06-25 NOTE — [facility] Group Notes (Signed)
[facility] LCSW Group Therapy  06/25/2014  1:15 PM  Type of Therapy: Group Therapy   Participation Level: Did Not Attend. Patient reportedly not feeling well.   [name], MSW, [employer]  Clinical Social Worker  [facility]  [phone]

## 2014-06-25 NOTE — Clinical Social Work Note (Signed)
[facility] LCSW Group Therapy  06/25/2014  1:15 PM  Type of Therapy: Group Therapy   Participation Level: Did Not Attend. Patient reportedly not feeling well.   Alfa Leibensperger, MSW, LCSWA  Clinical Social Worker  [facility]  [phone]  

## 2014-06-25 NOTE — Progress Notes (Signed)
D Pt. Denies SI and HI, no complaints of pain, pt. Does report discomfort when urinating due to the UTI.  No A or VH.  A Writer offered support and encouragement,  Encouraged pt. To increase her water intake.  Discussed discharge plans with pt.   R Pt. Remains safe on the unit,  Reports her depression is now a 0 as well as her anxiety and hopelessness.  Writer ask pt. What had changed since admission and pt. Reports she now realizes after meeting her peers that things are not as bad as she thought and she is ready to get back to her life.

## 2014-06-26 ENCOUNTER — Encounter (HOSPITAL_COMMUNITY): Payer: Self-pay | Admitting: Psychiatry

## 2014-06-26 DIAGNOSIS — F329 Major depressive disorder, single episode, unspecified: Secondary | ICD-10-CM | POA: Diagnosis present

## 2014-06-26 MED ORDER — CIPROFLOXACIN HCL 250 MG PO TABS: 250.0000 mg | ORAL_TABLET | Freq: Two times a day (BID) | ORAL | Status: DC

## 2014-06-26 MED ORDER — PHENAZOPYRIDINE HCL 100 MG PO TABS: 100.0000 mg | ORAL_TABLET | Freq: Three times a day (TID) | ORAL | Status: DC | PRN

## 2014-06-26 MED ORDER — ADULT MULTIVITAMIN W/MINERALS CH: 1.0000 | ORAL_TABLET | Freq: Every day | ORAL | Status: DC

## 2014-06-26 MED ORDER — FLUOXETINE HCL 20 MG PO CAPS: 20.0000 mg | ORAL_CAPSULE | Freq: Every day | ORAL | Status: DC

## 2014-06-26 NOTE — Progress Notes (Signed)
Patient ID: Dawn Price, female   DOB: September 02, 1986, 28 y.o.   MRN: [id number] She has been up more today and interacting a little more.  She tends to keep to herself. Has not been eating much d/t her not liking the food. Wants to go home and cook her some spicy food. On her self inventory she marked everything 0's. Stated that she wants to go home see new order.

## 2014-06-26 NOTE — Plan of Care (Signed)
Problem: Diagnosis: Increased Risk For Suicide Attempt Goal: LTG-Patient Will Report Improved Mood and Deny Suicidal LTG (by discharge) Patient will report improved mood and deny suicidal ideation.  Outcome: Progressing Pt reports positive improvement in mood and denies having suicidal thoughts at this time.

## 2014-06-26 NOTE — Progress Notes (Signed)
Pt reports she is having bladder pain r/t a UTI.  Writer spoke with the PA to get an order for pyridium.  Pt states mental she is doing well and had a good visit with her boyfriend.  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  Pt denies SI/HI/AV.  Pt makes her needs known to staff.  Support and encouragement offered.  Medicated as ordered.  Safety maintained with q15 minute checks.

## 2014-06-26 NOTE — [facility] Group Notes (Addendum)
   [facility] LCSW Aftercare Discharge Planning Group Note  06/26/2014  8:45 AM   Participation Quality: Alert, Appropriate and Oriented  Mood/Affect: Appropriate   Depression Rating: 0  Anxiety Rating: 1-2  Thoughts of Suicide: Pt denies SI/HI  Will you contract for safety? Yes  Current AVH: Pt denies  Plan for Discharge/Comments: Pt attended discharge planning group and actively participated in group. CSW provided pt with today's workbook. Patient reports that she feels "good about leaving today, ready for discharge but feeling nauseous". She denies SI/HI. She plans to attend upcoming therapy appointment at [facility] and make an appointment with Andee Poles, MD for medication management.   Transportation Means: Pt reports access to transportation  Supports: No supports mentioned at this time  [name], MSW, [employer] Clinical Social Worker [employer] [phone]

## 2014-06-26 NOTE — Progress Notes (Signed)
[facility] Adult Case Management Discharge Plan :  Will you be returning to the same living situation after discharge: Yes,  patient can return to her apartment At discharge, do you have transportation home?:Yes,  patient will have her own transportation Do you have the ability to pay for your medications:Yes,  patient will be provided with prescriptions at discharge and verbalizes her ability to obtain medications.  Release of information consent forms completed and in the chart;  Patient's signature needed at discharge.  Patient to Follow up at: Follow-up Information   Follow up with [facility] On 06/28/2014. (Please present for therapy appointment on this date at 9:30 am. Please allow 30 minutes to complete registration forms and you will be seen my counselor at 10 am. Please call office if you need to reschedule.)    Contact information:   [place]. [facility] [address] [place], [place] [address] [phone]      Follow up with Dr. Emerson Monte, MD On . (Please call office of Dr. Nolen Mu, psychiatrist, to schedule appointment for medication management. You will be required to pay $150 deposit when you schedule appointment. Please call office if you have further questions.)    Contact information:   [address]  [place], [place] [address] [phone]      Patient denies SI/HI:   Yes,  denies    Safety Planning and Suicide Prevention discussed:  Yes,  with patient and friend  Kseniya Grunden, [place] 06/26/2014, 10:31 AM

## 2014-06-26 NOTE — Progress Notes (Signed)
Patient ID: Dawn Price, female   DOB: 04-29-1986, 28 y.o.   MRN: [id number] She has been discharged home and was going to drive herself home. She voiced understanding of discharge instruction and of medication teaching. She denies SI thoughts and all her belongings were taken home with her.

## 2014-06-26 NOTE — Discharge Summary (Signed)
Physician Discharge Summary Note  Patient:  Dawn Price is an 28 y.o., female MRN:  [id number] DOB:  02/02/1986 Patient phone:  [phone] (home)  Patient address:   [address] [place] [address] [place] [address],  Total Time spent with patient: 20 minutes  Date of Admission:  06/24/2014 Date of Discharge: 06/26/14  Reason for Admission:  Depression, Suicidal thoughts  Discharge Diagnoses: Active Problems:   Major depressive disorder  Psychiatric Specialty Exam: Physical Exam  Psychiatric: She has a normal mood and affect. Her speech is normal and behavior is normal. Judgment and thought content normal. Cognition and memory are normal.    Review of Systems  Constitutional: Negative.   HENT: Negative.   Eyes: Negative.   Respiratory: Negative.   Cardiovascular: Negative.   Gastrointestinal: Negative.   Genitourinary: Positive for dysuria (Currently being treated for UTI with cipro ).  Musculoskeletal: Negative.   Skin: Negative.   Neurological: Negative.   Endo/Heme/Allergies: Negative.   Psychiatric/Behavioral: Positive for depression (Stable ).    Blood pressure 109/68, pulse 89, temperature 97.9 F (36.6 C), temperature source Oral, resp. rate 16, height 5' (1.524 m), weight 44.453 kg (98 lb).Body mass index is 19.14 kg/(m^2).  See Physician SRA                                                  Past Psychiatric History: See H&P Diagnosis:  Hospitalizations:  Outpatient Care:  Substance Abuse Care:  Self-Mutilation:  Suicidal Attempts:  Violent Behaviors:   Musculoskeletal: Strength & Muscle Tone: within normal limits Gait & Station: normal Patient leans: N/A  DSM5: AXIS I: Major Depression, Recurrent severe  AXIS II: Deferred  AXIS III:  Past Medical History   Diagnosis  Date   .  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 She presented to the ER yesterday evening, reporting depression, sadness, and vague suicidal ideations, but without plan or intentions. She has been facing some significant stressors, to include strained relationship with her boyfriend, due to her concerns that he may be unfaithful ( although she states " maybe its my own insecurity"), working on her thesis and upcoming graduation, and a recent elective abortion 4 weeks ago. States she has been feeling depressed for at least a few weeks. States she had been on Prozac in the past and had done well on this medication. She had stopped it about a year Ago.          Dawn Price was admitted to the adult 500 unit where she was evaluated and her symptoms were identified. Medication management was discussed and implemented. The patient was not taking any medications prior to admission. She was started on Prozac 20 mg daily to address symptoms of depression. She was encouraged to participate in unit programming. Medical problems were identified and treated appropriately. Patient was treated with a three day course of Cipro to treat symptoms of urinary tract infection. She was given Pyridium to address dysuria related to infection. Home medication was restarted as needed.  She was evaluated each day by a clinical provider to ascertain the patient's response to treatment.  Improvement was noted by  the patient's report of decreasing symptoms, improved sleep and appetite, affect, medication tolerance, behavior, and participation in unit programming.  The patient was asked each day to complete a self inventory noting mood, mental status, pain, new symptoms, anxiety and concerns.         She responded well to medication and being in a therapeutic and supportive environment. Positive and appropriate behavior was noted and the patient was motivated for recovery.  She worked closely with the treatment  team and case manager to develop a discharge plan with appropriate goals. Coping skills, problem solving as well as relaxation therapies were also part of the unit programming.         By the day of discharge she was in much improved condition than upon admission.  Symptoms were reported as significantly decreased or resolved completely.  The patient denied SI/HI and voiced no AVH. She was motivated to continue taking medication with a goal of continued improvement in mental health.  Dawn Price was discharged home with a plan to follow up as noted below.  Consults:  None  Significant Diagnostic Studies:  Chemistry panel, CBC, UDS negative   Discharge Vitals:   Blood pressure 109/68, pulse 89, temperature 97.9 F (36.6 C), temperature source Oral, resp. rate 16, height 5' (1.524 m), weight 44.453 kg (98 lb). Body mass index is 19.14 kg/(m^2). Lab Results:   Results for orders placed during the hospital encounter of 06/24/14 (from the past 72 hour(s))  CBC     Status: None   Collection Time    06/24/14  6:15 AM      Result Value Ref Range   WBC 6.8  4.0 - 10.5 K/uL   RBC 4.34  3.87 - 5.11 MIL/uL   Hemoglobin 13.0  12.0 - 15.0 g/dL   HCT 38.0  36.0 - 46.0 %   MCV 87.6  78.0 - 100.0 fL   MCH 30.0  26.0 - 34.0 pg   MCHC 34.2  30.0 - 36.0 g/dL   RDW 11.8  11.5 - 15.5 %   Platelets 283  150 - 400 K/uL   Comment: Performed at [facility] PANEL     Status: Abnormal   Collection Time    06/24/14  6:15 AM      Result Value Ref Range   Sodium 139  137 - 147 mEq/L   Potassium 4.3  3.7 - 5.3 mEq/L   Chloride 103  96 - 112 mEq/L   CO2 22  19 - 32 mEq/L   Glucose, Bld 107 (*) 70 - 99 mg/dL   BUN 11  6 - 23 mg/dL   Creatinine, Ser 0.63  0.50 - 1.10 mg/dL   Calcium 9.6  8.4 - 10.5 mg/dL   GFR calc non Af Amer >90  >90 mL/min   GFR calc Af Amer >90  >90 mL/min   Comment: (NOTE)     The eGFR has been calculated using the CKD EPI equation.     This  calculation has not been validated in all clinical situations.     eGFR's persistently <90 mL/min signify possible Chronic Kidney     Disease.   Anion gap 14  5 - 15   Comment: Performed at [facility]  TSH     Status: None   Collection Time    06/24/14  6:15 AM      Result Value Ref Range   TSH 1.030  0.350 - 4.500  uIU/mL   Comment: Performed at [facility]  HEPATIC FUNCTION PANEL     Status: None   Collection Time    06/24/14  6:15 AM      Result Value Ref Range   Total Protein 7.7  6.0 - 8.3 g/dL   Albumin 4.4  3.5 - 5.2 g/dL   AST 13  0 - 37 U/L   ALT 10  0 - 35 U/L   Alkaline Phosphatase 66  39 - 117 U/L   Total Bilirubin 0.7  0.3 - 1.2 mg/dL   Bilirubin, Direct <0.2  0.0 - 0.3 mg/dL   Indirect Bilirubin NOT CALCULATED  0.3 - 0.9 mg/dL   Comment: Performed at [facility], URINE     Status: None   Collection Time    06/24/14  6:14 PM      Result Value Ref Range   Preg Test, Ur NEGATIVE  NEGATIVE   Comment:            THE SENSITIVITY OF THIS     METHODOLOGY IS >20 mIU/mL.     Performed at [facility] MICROSCOPIC     Status: Abnormal   Collection Time    06/24/14  8:59 PM      Result Value Ref Range   Color, Urine YELLOW  YELLOW   APPearance CLEAR  CLEAR   Specific Gravity, Urine 1.022  1.005 - 1.030   pH 7.5  5.0 - 8.0   Glucose, UA NEGATIVE  NEGATIVE mg/dL   Hgb urine dipstick NEGATIVE  NEGATIVE   Bilirubin Urine NEGATIVE  NEGATIVE   Ketones, ur NEGATIVE  NEGATIVE mg/dL   Protein, ur NEGATIVE  NEGATIVE mg/dL   Urobilinogen, UA 0.2  0.0 - 1.0 mg/dL   Nitrite NEGATIVE  NEGATIVE   Leukocytes, UA SMALL (*) NEGATIVE   Comment: Performed at [facility]     Status: None   Collection Time    06/24/14  8:59 PM      Result Value Ref Range   Opiates NONE DETECTED  NONE DETECTED   Cocaine NONE DETECTED   NONE DETECTED   Benzodiazepines NONE DETECTED  NONE DETECTED   Amphetamines NONE DETECTED  NONE DETECTED   Tetrahydrocannabinol NONE DETECTED  NONE DETECTED   Barbiturates NONE DETECTED  NONE DETECTED   Comment:            DRUG SCREEN FOR MEDICAL PURPOSES     ONLY.  IF CONFIRMATION IS NEEDED     FOR ANY PURPOSE, NOTIFY LAB     WITHIN 5 DAYS.                LOWEST DETECTABLE LIMITS     FOR URINE DRUG SCREEN     Drug Class       Cutoff (ng/mL)     Amphetamine      1000     Barbiturate      200     Benzodiazepine   165     Tricyclics       537     Opiates          300     Cocaine          300     THC              50     Performed at [facility]  URINE MICROSCOPIC-ADD ON     Status: None   Collection Time    06/24/14  8:59 PM      Result Value Ref Range   Squamous Epithelial / LPF RARE  RARE   WBC, UA 11-20  <3 WBC/hpf   Bacteria, UA RARE  RARE   Urine-Other MUCOUS PRESENT     Comment: Performed at [facility]    Physical Findings: AIMS: Facial and Oral Movements Muscles of Facial Expression: None, normal Lips and Perioral Area: None, normal Jaw: None, normal Tongue: None, normal,Extremity Movements Upper (arms, wrists, hands, fingers): None, normal Lower (legs, knees, ankles, toes): None, normal, Trunk Movements Neck, shoulders, hips: None, normal, Overall Severity Severity of abnormal movements (highest score from questions above): None, normal Incapacitation due to abnormal movements: None, normal Patient's awareness of abnormal movements (rate only patient's report): No Awareness, Dental Status Current problems with teeth and/or dentures?: No Does patient usually wear dentures?: No  CIWA:    COWS:     Psychiatric Specialty Exam: See Psychiatric Specialty Exam and Suicide Risk Assessment completed by Attending Physician prior to discharge.  Discharge destination:  Home  Is patient on multiple antipsychotic therapies at  discharge:  No   Has Patient had three or more failed trials of antipsychotic monotherapy by history:  No  Recommended Plan for Multiple Antipsychotic Therapies: NA  Discharge Instructions   Discharge instructions    Complete by:  As directed   Please follow up with your Primary Care Provider if you continue to have symptoms of urinary tract infection. You received treatment with Cipro (an antibiotic) during your admission.            Medication List       Indication   ciprofloxacin 250 MG tablet  Commonly known as:  CIPRO  Take 1 tablet (250 mg total) by mouth 2 (two) times daily. Take until finished.   Indication:  Urethritis     CLEAR EYES OP  Apply 1 drop to eye daily as needed (for eye redness).      FLUoxetine 20 MG capsule  Commonly known as:  PROZAC  Take 1 capsule (20 mg total) by mouth daily. For depression   Indication:  Depression     ibuprofen 200 MG tablet  Commonly known as:  ADVIL,MOTRIN  Take 400 mg by mouth every 6 (six) hours as needed for cramping.      multivitamin with minerals Tabs tablet  Take 1 tablet by mouth daily. May purchase over the counter for vitamin supplementation   Indication:  Vitamin Supplementation     phenazopyridine 100 MG tablet  Commonly known as:  PYRIDIUM  Take 1 tablet (100 mg total) by mouth 3 (three) times daily as needed (dysuria).   Indication:  Bladder Lining Irritation           Follow-up Information   Follow up with [facility] On 06/28/2014. (Please present for therapy appointment on this date at 9:30 am. Please allow 30 minutes to complete registration forms and you will be seen my counselor at 10 am. Please call office if you need to reschedule.)    Contact information:   Betsey Holiday. [facility] [address] [place], [place] [address] [phone]      Follow up with Dr. Letta Moynahan, MD On . (Please call office of Dr. Caprice Beaver, psychiatrist, to schedule appointment for medication  management. You will be required to pay $150 deposit when you schedule  appointment. Please call office if you have further questions.)    Contact information:   [address],  [place], [place] [address] [phone]      Follow-up recommendations:   Activity: As tolerated  Diet: Regular  Tests: NA  Other: See below   Comments:   Take all your medications as prescribed by your mental healthcare provider.  Report any adverse effects and or reactions from your medicines to your outpatient provider promptly.  Patient is instructed and cautioned to not engage in alcohol and or illegal drug use while on prescription medicines.  In the event of worsening symptoms, patient is instructed to call the crisis hotline, 911 and or go to the nearest ED for appropriate evaluation and treatment of symptoms.  Follow-up with your primary care provider for your other medical issues, concerns and or health care needs.   Total Discharge Time:  Greater than 30 minutes.  SignedElmarie Shiley NP-C  06/26/2014, 10:11 AM  Patient seen, Suicide Assessment Completed.  Disposition Plan Reviewed

## 2014-06-26 NOTE — [facility] Suicide Risk Assessment (Signed)
Demographic Factors:  28 year old female, single, working on PhD, lives with roommate, no children  Total Time spent with patient: 30 minutes  Psychiatric Specialty Exam: Physical Exam  ROS  Blood pressure 109/68, pulse 89, temperature 97.9 F (36.6 C), temperature source Oral, resp. rate 16, height 5' (1.524 m), weight 44.453 kg (98 lb).Body mass index is 19.14 kg/(m^2).  General Appearance: Well Groomed  Patent attorney::  Good  Speech:  Normal Rate  Volume:  Normal  Mood:  improved, denies depression at this time  Affect:  Appropriate and fuller in range   Thought Process:  Goal Directed and Linear  Orientation:  Full (Time, Place, and Person)  Thought Content:  no hallucinations, no delusions  Suicidal Thoughts:  No- denies any suicidal or homicidal ideations  Homicidal Thoughts:  No  Memory:  NA  Judgement:  Good  Insight:  Fair  Psychomotor Activity:  Normal  Concentration:  Negative  Recall:  Good  Fund of Knowledge:Good  Language: Good  Akathisia:  Negative  Handed:  Right  AIMS (if indicated):     Assets:  Communication Skills Desire for Improvement Resilience Talents/Skills Vocational/Educational  Sleep:  Number of Hours: 5.75    Musculoskeletal: Strength & Muscle Tone: within normal limits Gait & Station: normal Patient leans: N/A   Mental Status Per Nursing Assessment::   On Admission:  Suicidal ideation indicated by patient  Current Mental Status by Physician: At this time patient is improved. Her mood has improved, her affect is appropriate and reactive. She is not suicidal , she is not homicidal , and she denies any psychotic symptoms. 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  Loss Factors: strain with boyfriend due to concerns about possible infidelity. States she was able to work this out with him and is no longer as concerned or ruminative about  this  Historical Factors: Prior suicide attempts  Risk Reduction Factors:   Employed, Positive social support and Positive coping skills or problem solving skills  Continued Clinical Symptoms:  As noted, depression is improved at this time  Cognitive Features That Contribute To Risk:  At this time there are no gross cognitive deficits noted  Suicide Risk:  Mild:  Suicidal ideation of limited frequency, intensity, duration, and specificity.  There are no identifiable plans, no associated intent, mild dysphoria and related symptoms, good self-control (both objective and subjective assessment), few other risk factors, and identifiable protective factors, including available and accessible social support.  Discharge Diagnoses:   AXIS I:  Major Depression, Recurrent severe AXIS II:  Deferred AXIS III:   Past Medical History  Diagnosis Date  . Anxiety    AXIS IV:  problems related to social environment AXIS V:  51-60 moderate symptoms ( 60- 65  upon discharge)   Plan Of Care/Follow-up recommendations:  Activity:  As tolerated Diet:  Regular Tests:  NA Other:  See below  Is patient on multiple antipsychotic therapies at discharge:  No   Has Patient had three or more failed trials of antipsychotic monotherapy by history:  No  Recommended Plan for Multiple Antipsychotic Therapies: NA  Patient is leaving our unit in good spirits. She plans to return home. She plans to obtain psychotherapy via [facility]. She is referred to [facility] for ongoing outpatient psychiatric treatment. Patient has had symptoms of UTI ( dysuria, frequency) , but has no fever, no chills ,  and does not appear toxic. She is being treated with Cipro and Pyridium . A Urine Culture taken prior to Abx initiation is pending. Patient plans to follow up with Dr. Alwyn Ren , her PCP.    COBOS, FERNANDO 06/26/2014, 9:51 AM

## 2014-06-27 LAB — URINE CULTURE

## 2014-06-28 NOTE — Progress Notes (Signed)
Patient Discharge Instructions:  After Visit Summary (AVS):   Faxed to:  06/28/14 Discharge Summary Note:   Faxed to:  06/28/14 Psychiatric Admission Assessment Note:   Faxed to:  06/28/14 Suicide Risk Assessment - Discharge Assessment:   Faxed to:  06/28/14 Faxed/Sent to the Next Level Care provider:  06/28/14 Faxed to [facility] @ [phone] Faxed to [facility] [place] @ [phone]  Jerelene Redden, 06/28/2014, 3:05 PM

## 2014-11-01 ENCOUNTER — Encounter (HOSPITAL_COMMUNITY): Payer: Self-pay | Admitting: Emergency Medicine

## 2014-11-01 ENCOUNTER — Emergency Department (HOSPITAL_COMMUNITY)
Admission: EM | Admit: 2014-11-01 | Discharge: 2014-11-01 | Disposition: A | Discharge status: Home / Self Care | Payer: Self-pay | Attending: Emergency Medicine | Admitting: Emergency Medicine

## 2014-11-01 DIAGNOSIS — F419 Anxiety disorder, unspecified: Secondary | ICD-10-CM | POA: Insufficient documentation

## 2014-11-01 DIAGNOSIS — Z79899 Other long term (current) drug therapy: Secondary | ICD-10-CM | POA: Insufficient documentation

## 2014-11-01 DIAGNOSIS — Z792 Long term (current) use of antibiotics: Secondary | ICD-10-CM | POA: Insufficient documentation

## 2014-11-01 DIAGNOSIS — F329 Major depressive disorder, single episode, unspecified: Secondary | ICD-10-CM | POA: Insufficient documentation

## 2014-11-01 DIAGNOSIS — F418 Other specified anxiety disorders: Secondary | ICD-10-CM

## 2014-11-01 DIAGNOSIS — L709 Acne, unspecified: Secondary | ICD-10-CM | POA: Insufficient documentation

## 2014-11-01 MED ORDER — DOXYCYCLINE HYCLATE 100 MG PO CAPS: 100.0000 mg | ORAL_CAPSULE | Freq: Two times a day (BID) | ORAL | Status: DC

## 2014-11-01 MED ORDER — LORAZEPAM 1 MG PO TABS: 1.0000 mg | ORAL_TABLET | Freq: Three times a day (TID) | ORAL | Status: DC | PRN

## 2014-11-01 NOTE — ED Notes (Signed)
Pt. reports high stress level  in school this week , requesting medication for her anxiety.

## 2014-11-01 NOTE — ED Provider Notes (Signed)
CSN: [id number]     Arrival date & time 11/01/14  2023 History   First MD Initiated Contact with Patient 11/01/14 2031     Chief Complaint  Patient presents with  . Anxiety     (Consider location/radiation/quality/duration/timing/severity/associated sxs/prior Treatment) Patient is a 29 y.o. female presenting with anxiety. The history is provided by the patient. No language interpreter was used.  Anxiety This is a new problem. 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 The problem occurs constantly. The problem has been unchanged. Pertinent negatives include no abdominal pain, arthralgias, chest pain, chills, fatigue, fever, nausea, neck pain, vomiting or weakness. Nothing aggravates the symptoms. She has tried nothing for the symptoms. The treatment provided no relief.    Past Medical History  Diagnosis Date  . Anxiety   . Major depressive disorder 06/26/2014   History reviewed. No pertinent past surgical history. No family history on file. History  Substance Use Topics  . Smoking status: Never Smoker   . Smokeless tobacco: Not on file  . Alcohol Use: Yes     Comment: occasionally, pt reports 2 x a week   OB History    No data available     Review of Systems  Constitutional: Negative for fever, chills and fatigue.  HENT: Negative for trouble swallowing.   Eyes: Negative for visual disturbance.  Respiratory: Negative for shortness of breath.   Cardiovascular: Negative for chest pain and palpitations.  Gastrointestinal: Negative for nausea, vomiting, abdominal pain and diarrhea.  Genitourinary: Negative for dysuria and difficulty urinating.  Musculoskeletal: Negative for arthralgias and neck pain.  Skin: Negative for color change.  Neurological: Negative for dizziness and weakness.  Psychiatric/Behavioral: Negative for dysphoric mood. The patient is nervous/anxious.       Allergies   Review of patient's allergies indicates no known allergies.  Home Medications   Prior to Admission medications   Medication Sig Start Date End Date Taking? Authorizing Provider  ciprofloxacin (CIPRO) 250 MG tablet Take 1 tablet (250 mg total) by mouth 2 (two) times daily. Take until finished. 06/26/14   Fransisca KaufmannLaura Davis, NP  FLUoxetine (PROZAC) 20 MG capsule Take 1 capsule (20 mg total) by mouth daily. For depression 06/26/14   Fransisca KaufmannLaura Davis, NP  ibuprofen (ADVIL,MOTRIN) 200 MG tablet Take 400 mg by mouth every 6 (six) hours as needed for cramping.    Historical Provider, MD  Multiple Vitamin (MULTIVITAMIN WITH MINERALS) TABS tablet Take 1 tablet by mouth daily. May purchase over the counter for vitamin supplementation 06/26/14   Fransisca KaufmannLaura Davis, NP  Naphazoline HCl (CLEAR EYES OP) Apply 1 drop to eye daily as needed (for eye redness).    Historical Provider, MD  phenazopyridine (PYRIDIUM) 100 MG tablet Take 1 tablet (100 mg total) by mouth 3 (three) times daily as needed (dysuria). 06/26/14   Fransisca KaufmannLaura Davis, NP   BP 130/72 mmHg  Pulse 99  Temp(Src) 98 F (36.7 C) (Oral)  Resp 18  SpO2 97%  LMP 10/14/2014 Physical Exam  Constitutional: She is oriented to person, place, and time. She appears well-developed and well-nourished. No distress.  HENT:  Head: Normocephalic and atraumatic.  Eyes: Conjunctivae and EOM are normal.  Neck: Normal range of motion.  Cardiovascular: Normal rate and regular rhythm.  Exam reveals no gallop and no friction rub.   No murmur heard. Pulmonary/Chest: Effort normal and breath sounds normal. She has no wheezes. She has no rales. She  exhibits no tenderness.  Abdominal: Soft. She exhibits no distension. There is no tenderness. There is no rebound.  Musculoskeletal: Normal range of motion.  Neurological: She is alert and oriented to person, place, and time. Coordination normal.  Speech is goal-oriented. Moves limbs without ataxia.   Skin: Skin is warm and dry.  Psychiatric: She  has a normal mood and affect. Her behavior is normal.  Nursing note and vitals reviewed.   ED Course  Procedures (including critical care time) Labs Review Labs Reviewed - No data to display  Imaging Review No results found.   EKG Interpretation None      MDM   Final diagnoses:  Situational anxiety  Acne, unspecified acne type    9:09 PM Patient reports situational anxiety due to PhD defense for next week. Patient will have short course of ativan for anxiety. Patient also requesting antibiotics for acne, likely related to stress. Patient has no other complaints at this time. Vitals stable and patient afebrile. Patient advised she will need for follow up with her PCP if she would like refills of these medications.    Emilia Beck, PA-C 11/01/14 2118  Juliet Rude. Rubin Payor, MD 11/02/14 1450

## 2014-11-01 NOTE — Discharge Instructions (Signed)
Take ativan as needed for anxiety. Take doxycycline as directed for acne. Refer to attached documents for more information. For refills, see your primary care provider.

## 2014-11-12 ENCOUNTER — Encounter (HOSPITAL_COMMUNITY): Payer: Self-pay | Admitting: Physical Medicine and Rehabilitation

## 2014-11-12 ENCOUNTER — Emergency Department (HOSPITAL_COMMUNITY)
Admission: EM | Admit: 2014-11-12 | Discharge: 2014-11-12 | Disposition: A | Discharge status: Home / Self Care | Payer: Self-pay | Attending: Emergency Medicine | Admitting: Emergency Medicine

## 2014-11-12 DIAGNOSIS — Z79899 Other long term (current) drug therapy: Secondary | ICD-10-CM | POA: Insufficient documentation

## 2014-11-12 DIAGNOSIS — Z792 Long term (current) use of antibiotics: Secondary | ICD-10-CM | POA: Insufficient documentation

## 2014-11-12 DIAGNOSIS — F419 Anxiety disorder, unspecified: Secondary | ICD-10-CM | POA: Insufficient documentation

## 2014-11-12 DIAGNOSIS — L700 Acne vulgaris: Secondary | ICD-10-CM

## 2014-11-12 DIAGNOSIS — Z793 Long term (current) use of hormonal contraceptives: Secondary | ICD-10-CM | POA: Insufficient documentation

## 2014-11-12 DIAGNOSIS — F329 Major depressive disorder, single episode, unspecified: Secondary | ICD-10-CM | POA: Insufficient documentation

## 2014-11-12 MED ORDER — NORGESTIMATE-ETH ESTRADIOL 0.25-35 MG-MCG PO TABS: 1.0000 | ORAL_TABLET | Freq: Every day | ORAL | Status: DC

## 2014-11-12 NOTE — Discharge Instructions (Signed)
Acne  Acne is a skin problem that causes pimples. Acne occurs when the pores in your skin get blocked. Your pores may become red, sore, and swollen (inflamed), or infected with a common skin bacterium (Propionibacterium acnes). Acne is a common skin problem. Up to 80% of people get acne at some time. Acne is especially common from the ages of 12 to 24. Acne usually goes away over time with proper treatment.  CAUSES   Your pores each contain an oil gland. The oil glands make an oily substance called sebum. Acne happens when these glands get plugged with sebum, dead skin cells, and dirt. The P. acnes bacteria that are normally found in the oil glands then multiply, causing inflammation. Acne is commonly triggered by changes in your hormones. These hormonal changes can cause the oil glands to get bigger and to make more sebum. Factors that can make acne worse include:   Hormone changes during adolescence.   Hormone changes during women's menstrual cycles.   Hormone changes during pregnancy.   Oil-based cosmetics and hair products.   Harshly scrubbing the skin.   Strong soaps.   Stress.   Hormone problems due to certain diseases.   Long or oily hair rubbing against the skin.   Certain medicines.   Pressure from headbands, backpacks, or shoulder pads.   Exposure to certain oils and chemicals.  SYMPTOMS   Acne often occurs on the face, neck, chest, and upper back. Symptoms include:   Small, red bumps (pimples or papules).   Whiteheads (closed comedones).   Blackheads (open comedones).   Small, pus-filled pimples (pustules).   Big, red pimples or pustules that feel tender.  More severe acne can cause:   An infected area that contains a collection of pus (abscess).   Hard, painful, fluid-filled sacs (cysts).   Scars.  DIAGNOSIS   Your caregiver can usually tell what the problem is by doing a physical exam.  TREATMENT   There are many good treatments for acne. Some are available over the counter and some  are available with a prescription. The treatment that is best for you depends on the type of acne you have and how severe it is. It may take 2 months of treatment before your acne gets better. Common treatments include:   Creams and lotions that prevent oil glands from clogging.   Creams and lotions that treat or prevent infections and inflammation.   Antibiotics applied to the skin or taken as a pill.   Pills that decrease sebum production.   Birth control pills.   Light or laser treatments.   Minor surgery.   Injections of medicine into the affected areas.   Chemicals that cause peeling of the skin.  HOME CARE INSTRUCTIONS   Good skin care is the most important part of treatment.   Wash your skin gently at least twice a day and after exercise. Always wash your skin before bed.   Use mild soap.   After each wash, apply a water-based skin moisturizer.   Keep your hair clean and off of your face. Shampoo your hair daily.   Only take medicines as directed by your caregiver.   Use a sunscreen or sunblock with SPF 30 or greater. This is especially important when you are using acne medicines.   Choose cosmetics that are noncomedogenic. This means they do not plug the oil glands.   Avoid leaning your chin or forehead on your hands.   Avoid wearing tight headbands or hats.     Avoid picking or squeezing your pimples. This can make your acne worse and cause scarring.  SEEK MEDICAL CARE IF:    Your acne is not better after 8 weeks.   Your acne gets worse.   You have a large area of skin that is red or tender.  Document Released: 10/08/2000 Document Revised: 02/25/2014 Document Reviewed: 07/30/2011  [employer] Patient Information 2015 [employer]. This information is not intended to replace advice given to you by your health care provider. Make sure you discuss any questions you have with your health care provider.

## 2014-11-12 NOTE — ED Provider Notes (Signed)
CSN: [id number]     Arrival date & time 11/12/14  1756 History   First MD Initiated Contact with Patient 11/12/14 1901     Chief Complaint  Patient presents with  . Rash     (Consider location/radiation/quality/duration/timing/severity/associated sxs/prior Treatment) The history is provided by the patient.     Dawn Price is a 29 y.o. female who presents for evaluation of facial acne.  Has been present for several weeks.  It is aggravated by stress, and possibly drinking milk.  She is treated here 11 days ago, for anxiety with Xanax.  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  She denies fever, chills, nausea, vomiting, weakness, dizziness, shortness of breath or chest pain.  She has previously been on oral contraceptives for acne, and would like to start them again.  There are no other known modifying factors.   Past Medical History  Diagnosis Date  . Anxiety   . Major depressive disorder 06/26/2014   History reviewed. No pertinent past surgical history. No family history on file. History  Substance Use Topics  . Smoking status: Never Smoker   . Smokeless tobacco: Not on file  . Alcohol Use: Yes     Comment: occasionally, pt reports 2 x a week   OB History    No data available     Review of Systems  All other systems reviewed and are negative.     Allergies  Review of patient's allergies indicates no known allergies.  Home Medications   Prior to Admission medications   Medication Sig Start Date End Date Taking? Authorizing Provider  ciprofloxacin (CIPRO) 250 MG tablet Take 1 tablet (250 mg total) by mouth 2 (two) times daily. Take until finished. 06/26/14   Fransisca KaufmannLaura Davis, NP  doxycycline (VIBRAMYCIN) 100 MG capsule Take 1 capsule (100 mg total) by mouth 2 (two) times daily. 11/01/14   Kaitlyn Szekalski, PA-C  FLUoxetine (PROZAC) 20 MG capsule Take 1 capsule (20 mg total) by mouth daily. For depression 06/26/14   Fransisca KaufmannLaura Davis, NP  ibuprofen (ADVIL,MOTRIN) 200 MG  tablet Take 400 mg by mouth every 6 (six) hours as needed for cramping.    Historical Provider, MD  LORazepam (ATIVAN) 1 MG tablet Take 1 tablet (1 mg total) by mouth 3 (three) times daily as needed for anxiety. 11/01/14   Emilia BeckKaitlyn Szekalski, PA-C  Multiple Vitamin (MULTIVITAMIN WITH MINERALS) TABS tablet Take 1 tablet by mouth daily. May purchase over the counter for vitamin supplementation 06/26/14   Fransisca KaufmannLaura Davis, NP  Naphazoline HCl (CLEAR EYES OP) Apply 1 drop to eye daily as needed (for eye redness).    Historical Provider, MD  norgestimate-ethinyl estradiol (ORTHO-CYCLEN, 28,) 0.25-35 MG-MCG tablet Take 1 tablet by mouth daily. 11/12/14   Flint MelterElliott L Tiphanie Vo, MD  phenazopyridine (PYRIDIUM) 100 MG tablet Take 1 tablet (100 mg total) by mouth 3 (three) times daily as needed (dysuria). 06/26/14   Fransisca KaufmannLaura Davis, NP   BP 105/58 mmHg  Pulse 75  Temp(Src) 97.5 F (36.4 C) (Oral)  Resp 18  SpO2 100%  LMP 10/14/2014 Physical Exam  Constitutional: She is oriented to person, place, and time. She appears well-developed and well-nourished.  HENT:  Head: Normocephalic and atraumatic.  Right Ear: External ear normal.  Left Ear: External ear normal.  Eyes: Conjunctivae and EOM are normal. Pupils are equal, round, and reactive to light.  Neck: Normal range of motion and phonation normal. Neck supple.  Cardiovascular: Normal rate, regular rhythm and normal heart sounds.  Pulmonary/Chest: Effort normal and breath sounds normal. She exhibits no bony tenderness.  Abdominal: Soft.  Musculoskeletal: Normal range of motion.  Neurological: She is alert and oriented to person, place, and time. No cranial nerve deficit or sensory deficit. She exhibits normal muscle tone. Coordination normal.  Skin: Skin is warm, dry and intact.  Minimal acne, without swelling, bilateral forehead.  No drainage or significant erythema.  Psychiatric: She has a normal mood and affect. Her behavior is normal. Judgment and thought content  normal.  Nursing note and vitals reviewed.   ED Course  Procedures (including critical care time)  Findings discussed with patient, all questions answered   Labs Review Labs Reviewed - No data to display  Imaging Review No results found.   EKG Interpretation None      MDM   Final diagnoses:  Acne vulgaris    Uncomplicated acne, without severe inflammation.   Nursing Notes Reviewed/ Care Coordinated Applicable Imaging Reviewed Interpretation of Laboratory Data incorporated into ED treatment  The patient appears reasonably screened and/or stabilized for discharge and I doubt any other medical condition or other [facility] requiring further screening, evaluation, or treatment in the ED at this time prior to discharge.  Plan: Home Medications- OCP; Home Treatments- facial cleansing; return here if the recommended treatment, does not improve the symptoms; Recommended follow up- PCP 2-3 weeks     Flint Melter, MD 11/12/14 [phone]

## 2014-11-12 NOTE — ED Notes (Signed)
Pt A&OX4, ambulatory at d/c with steady gait, NAD 

## 2014-11-12 NOTE — ED Notes (Signed)
Pt states she is allergic to dairy products, drank milk last night, now reports rash to forehead. Respirations unlabored. No signs of distress noted.

## 2014-11-22 ENCOUNTER — Ambulatory Visit (INDEPENDENT_AMBULATORY_CARE_PROVIDER_SITE_OTHER): Payer: BLUE CROSS/BLUE SHIELD | Admitting: Family Medicine

## 2014-11-22 VITALS — BP 100/60 | HR 81 | Temp 98.2°F | Resp 16 | Ht 60.0 in | Wt 87.0 lb

## 2014-11-22 DIAGNOSIS — F419 Anxiety disorder, unspecified: Secondary | ICD-10-CM

## 2014-11-22 DIAGNOSIS — L700 Acne vulgaris: Secondary | ICD-10-CM

## 2014-11-22 DIAGNOSIS — R12 Heartburn: Secondary | ICD-10-CM

## 2014-11-22 DIAGNOSIS — R634 Abnormal weight loss: Secondary | ICD-10-CM

## 2014-11-22 DIAGNOSIS — Z658 Other specified problems related to psychosocial circumstances: Secondary | ICD-10-CM

## 2014-11-22 DIAGNOSIS — F439 Reaction to severe stress, unspecified: Secondary | ICD-10-CM

## 2014-11-22 LAB — POCT URINALYSIS DIPSTICK
Bilirubin, UA: NEGATIVE
GLUCOSE UA: NEGATIVE
Ketones, UA: 40
LEUKOCYTES UA: NEGATIVE
Nitrite, UA: NEGATIVE
PH UA: 5.5
RBC UA: NEGATIVE
Spec Grav, UA: >=1.03
Urobilinogen, UA: 0.2

## 2014-11-22 LAB — POCT UA - MICROSCOPIC ONLY
BACTERIA, U MICROSCOPIC: NEGATIVE
Casts, Ur, LPF, POC: NEGATIVE
Crystals, Ur, HPF, POC: NEGATIVE
Mucus, UA: POSITIVE
RBC, urine, microscopic: NEGATIVE
WBC, UR, HPF, POC: NEGATIVE
YEAST UA: NEGATIVE

## 2014-11-22 LAB — POCT CBC
GRANULOCYTE PERCENT: 57.6 % (ref 37–80)
HEMATOCRIT: 37.6 % — AB (ref 37.7–47.9)
HEMOGLOBIN: 12.6 g/dL (ref 12.2–16.2)
Lymph, poc: 2.1 (ref 0.6–3.4)
MCH, POC: 29.9 pg (ref 27–31.2)
MCHC: 33.4 g/dL (ref 31.8–35.4)
MCV: 89.6 fL (ref 80–97)
MID (CBC): 0.3 (ref 0–0.9)
MPV: 6.6 fL (ref 0–99.8)
POC GRANULOCYTE: 3.2 (ref 2–6.9)
POC LYMPH PERCENT: 37.5 %L (ref 10–50)
POC MID %: 4.9 %M (ref 0–12)
Platelet Count, POC: 264 10*3/uL (ref 142–424)
RBC: 4.2 M/uL (ref 4.04–5.48)
RDW, POC: 13.2 %
WBC: 5.5 10*3/uL (ref 4.6–10.2)

## 2014-11-22 LAB — POCT GLYCOSYLATED HEMOGLOBIN (HGB A1C): Hemoglobin A1C: 4.8

## 2014-11-22 LAB — TSH: TSH: 1.393 u[IU]/mL (ref 0.350–4.500)

## 2014-11-22 MED ORDER — CLINDAMYCIN PHOSPHATE 1 % EX GEL: Freq: Two times a day (BID) | CUTANEOUS | Status: DC

## 2014-11-22 MED ORDER — PAROXETINE HCL 20 MG PO TABS: 20.0000 mg | ORAL_TABLET | Freq: Every day | ORAL | Status: DC

## 2014-11-22 MED ORDER — OMEPRAZOLE 20 MG PO CPDR: DELAYED_RELEASE_CAPSULE | ORAL | Status: DC

## 2014-11-22 NOTE — Progress Notes (Signed)
Subjective 29 year old female who I saw last summer. At that time she is concerned about her weight. This is been a stressful 6 months. 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 She has given her defense and has completed her work. She will get her degree in May. She is beginning looking for employment. She has lost 3 pounds according to the scales, feels like it's more than that she had on with her closed today. Menstrual cycles been regular except for this time it is one week late. She is not sexually involved no risk pregnancy. She has friends that she associates with. She plans on some time rest and relaxation. She does not get regular exercise. She's been very concerned about the acne she has. She is given a course of doxycycline which she has completed. She does have some heartburn symptoms.  Objective Well-developed young lady in no acute distress, alert and oriented. Her TMs are normal. Eyes PERRLA. Throat clear. Neck supple without nodes. Chest clear to auscultation. Heart regular without murmurs. Abdomen soft nontender. Extremities normal. She is quite slender. She has mild acne on her face. None on the back.   Assessment: Weight loss Delayed menstrual onset Stress/anxiety, primarily situational Sleep disturbance Heartburn  Plan: Check labs Fluoxetine PPI for GERD Clindamycin gel for face Liberalized diet Return in one month for physical

## 2014-11-22 NOTE — Patient Instructions (Addendum)
Eat more fried and fatty foods  Try to get daily exercise  Watch her face daily  Apply the clindamycin gel a small amount to the areas of acne once or twice daily  Return in one month for a physical.  I'll let you know the results of your labs  Take omeprazole daily for heartburn

## 2014-11-23 LAB — COMPREHENSIVE METABOLIC PANEL
ALK PHOS: 53 U/L (ref 39–117)
ALT: 17 U/L (ref 0–35)
AST: 18 U/L (ref 0–37)
Albumin: 4.4 g/dL (ref 3.5–5.2)
BILIRUBIN TOTAL: 1 mg/dL (ref 0.2–1.2)
BUN: 8 mg/dL (ref 6–23)
CALCIUM: 9.3 mg/dL (ref 8.4–10.5)
CHLORIDE: 107 meq/L (ref 96–112)
CO2: 23 meq/L (ref 19–32)
CREATININE: 0.56 mg/dL (ref 0.50–1.10)
GLUCOSE: 83 mg/dL (ref 70–99)
Potassium: 4.4 mEq/L (ref 3.5–5.3)
Sodium: 138 mEq/L (ref 135–145)
Total Protein: 6.9 g/dL (ref 6.0–8.3)

## 2014-11-23 LAB — LIPID PANEL
Cholesterol: 113 mg/dL (ref 0–200)
HDL: 42 mg/dL (ref >39)
LDL Cholesterol: 64 mg/dL (ref 0–99)
Total CHOL/HDL Ratio: 2.7 Ratio
Triglycerides: 37 mg/dL (ref <150)
VLDL: 7 mg/dL (ref 0–40)

## 2014-11-24 ENCOUNTER — Encounter: Payer: Self-pay | Admitting: *Deleted

## 2014-11-27 ENCOUNTER — Telehealth: Payer: Self-pay

## 2014-11-27 NOTE — Telephone Encounter (Signed)
Pt called to inquire about her lab results and why some results show up in mychart but some don't. Please advise. CB # [phone]

## 2014-11-28 NOTE — Telephone Encounter (Signed)
Spoke with pt, advised her that sometimes the results take a couple of days to load. Pt rechecked today and the results were released.

## 2014-12-07 ENCOUNTER — Ambulatory Visit (INDEPENDENT_AMBULATORY_CARE_PROVIDER_SITE_OTHER): Payer: BLUE CROSS/BLUE SHIELD | Admitting: Family Medicine

## 2014-12-07 VITALS — BP 100/68 | HR 80 | Temp 97.4°F | Resp 18 | Ht 60.0 in | Wt 86.0 lb

## 2014-12-07 DIAGNOSIS — L700 Acne vulgaris: Secondary | ICD-10-CM

## 2014-12-07 DIAGNOSIS — L309 Dermatitis, unspecified: Secondary | ICD-10-CM

## 2014-12-07 NOTE — Progress Notes (Signed)
Subjective: Overall she is feeling better. She has gotten rid of some of the larger pimples on her face, but has a fine rash on the forehead, cheeks, chin that has a burning sensation to it. She does not feel like it represents acne. She has used her medications. Overall she is feeling better. She is eating better. She has had a menstrual cycle.  Objective: Very fine rash on the for head cheeks and chin. Still however these are tiny individual macules. She has a few other a larger more clearly acneiform lesions.  Assessment: Facial dermatitis Active vulgaris  Plan  I believe that the fine rash also is tiny acne in the pores. However will have her take an antihistamine since she does have the burning sensation with it. If not improving she is to call back and we will go ahead and make a referral to a dermatologist. I am happy to see her back, but I don't think that would be necessary before make such a referral.

## 2014-12-07 NOTE — Patient Instructions (Signed)
Continue using the antibiotic challenge her face  Take Zyrtec (cetirizine) one daily after supper for burning and possible allergic rash  If not improving over the next week call back and we will go ahead and put who a referral to a skin specialist (dermatologist) for you  Continue to try and eat generously to gained back the weight loss. Monitor your weight, and if you seem to continue to lose weight please return

## 2015-01-16 ENCOUNTER — Ambulatory Visit: Payer: Self-pay | Admitting: Internal Medicine

## 2015-01-29 ENCOUNTER — Emergency Department (HOSPITAL_COMMUNITY)
Admission: EM | Admit: 2015-01-29 | Discharge: 2015-01-29 | Disposition: A | Discharge status: Home / Self Care | Payer: BLUE CROSS/BLUE SHIELD | Attending: Emergency Medicine | Admitting: Emergency Medicine

## 2015-01-29 ENCOUNTER — Emergency Department (HOSPITAL_COMMUNITY): Payer: BLUE CROSS/BLUE SHIELD

## 2015-01-29 ENCOUNTER — Encounter (HOSPITAL_COMMUNITY): Payer: Self-pay

## 2015-01-29 DIAGNOSIS — N83201 Unspecified ovarian cyst, right side: Secondary | ICD-10-CM

## 2015-01-29 DIAGNOSIS — F329 Major depressive disorder, single episode, unspecified: Secondary | ICD-10-CM | POA: Insufficient documentation

## 2015-01-29 DIAGNOSIS — R52 Pain, unspecified: Secondary | ICD-10-CM

## 2015-01-29 DIAGNOSIS — R1031 Right lower quadrant pain: Secondary | ICD-10-CM | POA: Diagnosis present

## 2015-01-29 DIAGNOSIS — N730 Acute parametritis and pelvic cellulitis: Secondary | ICD-10-CM | POA: Diagnosis not present

## 2015-01-29 DIAGNOSIS — Z79899 Other long term (current) drug therapy: Secondary | ICD-10-CM | POA: Diagnosis not present

## 2015-01-29 DIAGNOSIS — Z3202 Encounter for pregnancy test, result negative: Secondary | ICD-10-CM | POA: Diagnosis not present

## 2015-01-29 DIAGNOSIS — F419 Anxiety disorder, unspecified: Secondary | ICD-10-CM | POA: Diagnosis not present

## 2015-01-29 DIAGNOSIS — N8320 Unspecified ovarian cysts: Secondary | ICD-10-CM | POA: Insufficient documentation

## 2015-01-29 DIAGNOSIS — Z792 Long term (current) use of antibiotics: Secondary | ICD-10-CM | POA: Insufficient documentation

## 2015-01-29 LAB — URINALYSIS, ROUTINE W REFLEX MICROSCOPIC
Bilirubin Urine: NEGATIVE
Glucose, UA: NEGATIVE mg/dL
Hgb urine dipstick: NEGATIVE
Ketones, ur: NEGATIVE mg/dL
Leukocytes, UA: NEGATIVE
Nitrite: NEGATIVE
Protein, ur: NEGATIVE mg/dL
Specific Gravity, Urine: 1.014 (ref 1.005–1.030)
Urobilinogen, UA: 0.2 mg/dL (ref 0.0–1.0)
pH: 5.5 (ref 5.0–8.0)

## 2015-01-29 LAB — CBC WITH DIFFERENTIAL/PLATELET
Basophils Absolute: 0 10*3/uL (ref 0.0–0.1)
Basophils Relative: 0 % (ref 0–1)
Eosinophils Absolute: 0 10*3/uL (ref 0.0–0.7)
Eosinophils Relative: 0 % (ref 0–5)
HCT: 38.8 % (ref 36.0–46.0)
Hemoglobin: 12.9 g/dL (ref 12.0–15.0)
Lymphocytes Relative: 29 % (ref 12–46)
Lymphs Abs: 2.5 10*3/uL (ref 0.7–4.0)
MCH: 29.8 pg (ref 26.0–34.0)
MCHC: 33.2 g/dL (ref 30.0–36.0)
MCV: 89.6 fL (ref 78.0–100.0)
Monocytes Absolute: 0.5 10*3/uL (ref 0.1–1.0)
Monocytes Relative: 6 % (ref 3–12)
Neutro Abs: 5.8 10*3/uL (ref 1.7–7.7)
Neutrophils Relative %: 65 % (ref 43–77)
Platelets: 254 10*3/uL (ref 150–400)
RBC: 4.33 MIL/uL (ref 3.87–5.11)
RDW: 12 % (ref 11.5–15.5)
WBC: 8.9 10*3/uL (ref 4.0–10.5)

## 2015-01-29 LAB — BASIC METABOLIC PANEL
Anion gap: 9 (ref 5–15)
BUN: 8 mg/dL (ref 6–23)
CO2: 24 mmol/L (ref 19–32)
Calcium: 9.3 mg/dL (ref 8.4–10.5)
Chloride: 107 mmol/L (ref 96–112)
Creatinine, Ser: 0.53 mg/dL (ref 0.50–1.10)
GFR calc Af Amer: >90 mL/min (ref >90)
GFR calc non Af Amer: >90 mL/min (ref >90)
Glucose, Bld: 81 mg/dL (ref 70–99)
Potassium: 3.8 mmol/L (ref 3.5–5.1)
Sodium: 140 mmol/L (ref 135–145)

## 2015-01-29 LAB — WET PREP, GENITAL
Trich, Wet Prep: NONE SEEN
Yeast Wet Prep HPF POC: NONE SEEN

## 2015-01-29 LAB — LIPASE, BLOOD: Lipase: 28 U/L (ref 11–59)

## 2015-01-29 LAB — POC URINE PREG, ED: Preg Test, Ur: NEGATIVE

## 2015-01-29 MED ORDER — ONDANSETRON HCL 4 MG/2ML IJ SOLN
4.0000 mg | Freq: Once | INTRAMUSCULAR | Status: AC
  Administered 2015-01-29: 4 mg via INTRAVENOUS
  Filled 2015-01-29: qty 2

## 2015-01-29 MED ORDER — IOHEXOL 300 MG/ML  SOLN
50.0000 mL | Freq: Once | INTRAMUSCULAR | Status: AC | PRN
  Administered 2015-01-29: 50 mL via ORAL

## 2015-01-29 MED ORDER — HYDROCODONE-IBUPROFEN 5-200 MG PO TABS: 1.0000 | ORAL_TABLET | Freq: Three times a day (TID) | ORAL | Status: DC | PRN

## 2015-01-29 MED ORDER — LIDOCAINE HCL 1 % IJ SOLN
INTRAMUSCULAR | Status: AC
  Administered 2015-01-29: 20 mL
  Filled 2015-01-29: qty 20

## 2015-01-29 MED ORDER — CEFTRIAXONE SODIUM 250 MG IJ SOLR
250.0000 mg | Freq: Once | INTRAMUSCULAR | Status: AC
Start: 2015-01-29 — End: 2015-01-29
  Administered 2015-01-29: 250 mg via INTRAMUSCULAR
  Filled 2015-01-29: qty 250

## 2015-01-29 MED ORDER — FENTANYL CITRATE 0.05 MG/ML IJ SOLN
12.5000 ug | Freq: Once | INTRAMUSCULAR | Status: AC
  Administered 2015-01-29: 12.5 ug via INTRAVENOUS
  Filled 2015-01-29: qty 2

## 2015-01-29 MED ORDER — METRONIDAZOLE 500 MG PO TABS: 500.0000 mg | ORAL_TABLET | Freq: Two times a day (BID) | ORAL | Status: DC

## 2015-01-29 MED ORDER — DOXYCYCLINE HYCLATE 100 MG PO CAPS: 100.0000 mg | ORAL_CAPSULE | Freq: Two times a day (BID) | ORAL | Status: DC

## 2015-01-29 MED ORDER — SODIUM CHLORIDE 0.9 % IV BOLUS (SEPSIS)
1000.0000 mL | Freq: Once | INTRAVENOUS | Status: AC
  Administered 2015-01-29: 1000 mL via INTRAVENOUS

## 2015-01-29 MED ORDER — IOHEXOL 300 MG/ML  SOLN
70.0000 mL | Freq: Once | INTRAMUSCULAR | Status: AC | PRN
  Administered 2015-01-29: 70 mL via INTRAVENOUS

## 2015-01-29 NOTE — ED Notes (Signed)
Pt attempting to provide urine sample at present time. 

## 2015-01-29 NOTE — ED Notes (Signed)
Patient c/o urinary frequency, pelvic pain, and nausea x 2 weeks

## 2015-01-29 NOTE — ED Notes (Signed)
Pt returned from CT via stretcher.

## 2015-01-29 NOTE — ED Notes (Signed)
US at bedside

## 2015-01-29 NOTE — ED Notes (Signed)
Pelvic cart outside door per PA; awaiting US results to determine whether pelvic to be completed.

## 2015-01-29 NOTE — Discharge Instructions (Signed)
Please contact your primary care provider and inform them of your visit and all relevant information. Please use antibiotics as directed for complete duration of therapy. Please use ibuprofen and Tylenol as needed for pain. Each medication can be used 6 hours from last administration, overlapping every 3 alternating. Please use narcotic pain medication only for breakthrough pain; do not drive or consume alcohol. He is avoid alcohol use with metronidazole. Monitor for worsening signs or symptoms please return if any present.

## 2015-01-29 NOTE — ED Provider Notes (Signed)
CSN: [id number]     Arrival date & time 01/29/15  1535 History   First MD Initiated Contact with Patient 01/29/15 1742     Chief Complaint  Patient presents with  . Pelvic Pain  . Nausea    HPI   29 year old female presents with right lower quadrant pain. She reports that for the past 2 weeks she's had pelvic cramping and right lower quadrant with radiation into her right flank. She notes that she was seen by her OB/GYN within ultrasound on April 1st showing complex 3.1 cm right ovarian cyst. Patient reports that today she had worsening of right lower quadrant pain with 2 episodes of vomiting. Patient reports that she is not currently sexually active with her last partner in October 2015. Patient reports since then she's been tested for STDs and has been negative. That she has had vaginal discharge, denies odor, bleeding. Her menstrual cycles are irregular and her last one was March 8, but did not have one the month before; normal. She does note urinary frequency, denies painful urination.    Past Medical History  Diagnosis Date  . Anxiety   . Major depressive disorder 06/26/2014   History reviewed. No pertinent past surgical history. History reviewed. No pertinent family history. History  Substance Use Topics  . Smoking status: Never Smoker   . Smokeless tobacco: Not on file  . Alcohol Use: Yes     Comment: occasionally, pt reports 2 x a week   OB History    No data available     Review of Systems  All other systems reviewed and are negative.  Allergies  Almond oil and Peanuts  Home Medications   Prior to Admission medications   Medication Sig Start Date End Date Taking? Authorizing Provider  Multiple Vitamin (MULTIVITAMIN WITH MINERALS) TABS tablet Take 1 tablet by mouth daily. May purchase over the counter for vitamin supplementation 06/26/14  Yes Thermon LeylandLaura A Davis, NP  clindamycin (CLINDAGEL) 1 % gel Apply topically 2 (two) times daily. Patient not taking: Reported on 01/29/2015  11/22/14   Peyton Najjaravid H Hopper, MD  doxycycline (VIBRAMYCIN) 100 MG capsule Take 1 capsule (100 mg total) by mouth 2 (two) times daily. Patient not taking: Reported on 12/07/2014 11/01/14   Emilia BeckKaitlyn Szekalski, PA-C  FLUoxetine (PROZAC) 20 MG capsule Take 1 capsule (20 mg total) by mouth daily. For depression Patient not taking: Reported on 01/29/2015 06/26/14   Thermon LeylandLaura A Davis, NP  LORazepam (ATIVAN) 1 MG tablet Take 1 tablet (1 mg total) by mouth 3 (three) times daily as needed for anxiety. Patient not taking: Reported on 01/29/2015 11/01/14   Emilia BeckKaitlyn Szekalski, PA-C  norgestimate-ethinyl estradiol (ORTHO-CYCLEN, 28,) 0.25-35 MG-MCG tablet Take 1 tablet by mouth daily. Patient not taking: Reported on 11/22/2014 11/12/14   Mancel BaleElliott Wentz, MD  omeprazole (PRILOSEC) 20 MG capsule Take 1 each evening for treatment and prevention of heartburn Patient not taking: Reported on 01/29/2015 11/22/14   Peyton Najjaravid H Hopper, MD  PARoxetine (PAXIL) 20 MG tablet Take 1 tablet (20 mg total) by mouth daily. Patient not taking: Reported on 01/29/2015 11/22/14   Peyton Najjaravid H Hopper, MD  phenazopyridine (PYRIDIUM) 100 MG tablet Take 1 tablet (100 mg total) by mouth 3 (three) times daily as needed (dysuria). Patient not taking: Reported on 12/07/2014 06/26/14   Thermon LeylandLaura A Davis, NP   BP 109/72 mmHg  Pulse 81  Temp(Src) 97.5 F (36.4 C) (Oral)  Resp 16  SpO2 98%  LMP 12/31/2014   Physical Exam  Constitutional:  She is oriented to person, place, and time. She appears well-developed and well-nourished.  HENT:  Head: Normocephalic and atraumatic.  Eyes: Pupils are equal, round, and reactive to light.  Neck: Normal range of motion. Neck supple. No JVD present. No tracheal deviation present. No thyromegaly present.  Cardiovascular: Normal rate, regular rhythm, normal heart sounds and intact distal pulses.  Exam reveals no gallop and no friction rub.   No murmur heard. Pulmonary/Chest: Effort normal and breath sounds normal. No stridor. No respiratory  distress. She has no wheezes. She has no rales. She exhibits no tenderness.  Abdominal: There is tenderness in the left lower quadrant.  Patient mildly tender diffusely in the abdomen worse with palpation of the right lower quadrant.  Genitourinary: Vagina normal. Cervix exhibits motion tenderness and discharge. Cervix exhibits no friability. Right adnexum displays tenderness. Right adnexum displays no mass and no fullness. Left adnexum displays tenderness. Left adnexum displays no mass and no fullness. No erythema, tenderness or bleeding in the vagina. No foreign body around the vagina. No signs of injury around the vagina. No vaginal discharge found.  Musculoskeletal: Normal range of motion.  Lymphadenopathy:    She has no cervical adenopathy.  Neurological: She is alert and oriented to person, place, and time. Coordination normal.  Skin: Skin is warm and dry.  Psychiatric: She has a normal mood and affect. Her behavior is normal. Judgment and thought content normal.  Nursing note and vitals reviewed.   ED Course  Procedures (including critical care time) Labs Review Labs Reviewed  URINALYSIS, ROUTINE W REFLEX MICROSCOPIC  CBC WITH DIFFERENTIAL/PLATELET  BASIC METABOLIC PANEL  LIPASE, BLOOD  POC URINE PREG, ED    Imaging Review No results found.   EKG Interpretation None      MDM   Final diagnoses:  Cyst of right ovary  PID (acute pelvic inflammatory disease)   Labs: Wet prep clue cells few WBC  CBC, BMP, lipase, urinalysis, pregnancy no significant findings  Imaging: CT abdomen and pelvis showed a follicle or simple cyst right ovary uncomplicated similar periods to previous ultrasound no signs of free pelvic fluid or complexity of the structures suggest this as a source of symptoms. Unremarkable appendix  Ultrasound pelvis endometrial  thickness consider follow-up ultrasound 6 weeks  Consults: None  Therapeutics: Fentanyl, ceftriaxone  Assessment: PID, ovarian  cyst  Plan: Patient's presentation likely represents a mixed picture. Patient pain is likely due to post the ovarian cyst and PID. Although she reports she is currently sexually active and has had a recent [facility] chlamydia screen there is significant discharge in the vaginal vault and significant pain with cervical motion tenderness. No abscesses fullness noted patient was treated with ceftriaxone here and given metronidazole and doxycycline for take home therapy. She is instructed to take the entire course and contact her primary care provider for follow-up and further management and evaluation of ongoing ovarian cyst pain. She is instructed to follow-up for repeat ultrasound as requested by radiologist. She was given return instructions and encouraged to monitor for new or worsening signs or symptoms. She was also given a prescription for hydrocodone as needed for breakthrough pain but chews instructed to use Tylenol or ibuprofen. Patient was able to tolerate fluids, and seemed visibly more comfortable at the end of her hospital stay. Patient understood and agreed to plan.      Eyvonne Mechanic, PA-C 01/30/15 0105  Raeford Razor, MD 02/03/15 [phone]

## 2015-01-29 NOTE — ED Notes (Signed)
Pt reports worsening pelvic cramping for past 2 weeks. Pt reports two episodes of vomiting today and severe increase in pain. Pt reports hx of right ovary cyst.

## 2015-01-30 LAB — HIV ANTIBODY (ROUTINE TESTING W REFLEX): HIV Screen 4th Generation wRfx: NONREACTIVE

## 2015-01-30 LAB — RPR: RPR Ser Ql: NONREACTIVE

## 2015-01-30 LAB — GC/CHLAMYDIA PROBE AMP (~~LOC~~) NOT AT ARMC
Chlamydia: NEGATIVE
Neisseria Gonorrhea: NEGATIVE

## 2015-02-08 ENCOUNTER — Emergency Department (HOSPITAL_COMMUNITY)
Admission: EM | Admit: 2015-02-08 | Discharge: 2015-02-09 | Disposition: A | Discharge status: Home / Self Care | Payer: BLUE CROSS/BLUE SHIELD | Attending: Emergency Medicine | Admitting: Emergency Medicine

## 2015-02-08 ENCOUNTER — Encounter (HOSPITAL_COMMUNITY): Payer: Self-pay | Admitting: Emergency Medicine

## 2015-02-08 DIAGNOSIS — Z793 Long term (current) use of hormonal contraceptives: Secondary | ICD-10-CM | POA: Diagnosis not present

## 2015-02-08 DIAGNOSIS — Z792 Long term (current) use of antibiotics: Secondary | ICD-10-CM | POA: Diagnosis not present

## 2015-02-08 DIAGNOSIS — Z79899 Other long term (current) drug therapy: Secondary | ICD-10-CM | POA: Insufficient documentation

## 2015-02-08 DIAGNOSIS — R102 Pelvic and perineal pain: Secondary | ICD-10-CM | POA: Diagnosis present

## 2015-02-08 DIAGNOSIS — R109 Unspecified abdominal pain: Secondary | ICD-10-CM

## 2015-02-08 DIAGNOSIS — F419 Anxiety disorder, unspecified: Secondary | ICD-10-CM | POA: Insufficient documentation

## 2015-02-08 DIAGNOSIS — F329 Major depressive disorder, single episode, unspecified: Secondary | ICD-10-CM | POA: Insufficient documentation

## 2015-02-08 DIAGNOSIS — K5900 Constipation, unspecified: Secondary | ICD-10-CM | POA: Diagnosis not present

## 2015-02-08 DIAGNOSIS — Z3202 Encounter for pregnancy test, result negative: Secondary | ICD-10-CM | POA: Insufficient documentation

## 2015-02-08 LAB — URINALYSIS, ROUTINE W REFLEX MICROSCOPIC
Bilirubin Urine: NEGATIVE
Glucose, UA: NEGATIVE mg/dL
Hgb urine dipstick: NEGATIVE
KETONES UR: NEGATIVE mg/dL
LEUKOCYTES UA: NEGATIVE
Nitrite: NEGATIVE
Protein, ur: NEGATIVE mg/dL
Specific Gravity, Urine: 1.012 (ref 1.005–1.030)
UROBILINOGEN UA: 0.2 mg/dL (ref 0.0–1.0)
pH: 6 (ref 5.0–8.0)

## 2015-02-08 LAB — PREGNANCY, URINE: Preg Test, Ur: NEGATIVE

## 2015-02-08 NOTE — ED Provider Notes (Signed)
CSN: [id number]     Arrival date & time 02/08/15  2213 History   First MD Initiated Contact with Patient 02/08/15 2255     Chief Complaint  Patient presents with  . Pelvic Pain     (Consider location/radiation/quality/duration/timing/severity/associated sxs/prior Treatment) HPI Dawn Price is a 29 y.o female who presents for increased RLQ pain for the past 3 weeks with radiation to bilateral flanks.  She was seen by her OB/GYN on 4/1 and had a 3.1 cm right ovarian cyst.  She states that she is not currently sexually active and had 1 partner in the past 6 months. Her last menstrual period ended 2 days ago. She had a negative GC, Chlamydia, and HIV test in the past month. She states she has thin clear vaginal discharge.  She denies any fever, vaginal odor, vaginal bleeding, hematuria, or dysuria. She was seen on 4/7 in the ED for the same pain and was treated for PID with ceftriaxone in the ED and metronidazole and doxycycline.  Past Medical History  Diagnosis Date  . Anxiety   . Major depressive disorder 06/26/2014   History reviewed. No pertinent past surgical history. History reviewed. No pertinent family history. History  Substance Use Topics  . Smoking status: Never Smoker   . Smokeless tobacco: Not on file  . Alcohol Use: Yes     Comment: occasionally, pt reports 2 x a week   OB History    No data available     Review of Systems  Gastrointestinal: Negative for nausea and vomiting.  Endocrine: Negative for polyuria.  Genitourinary: Positive for pelvic pain. Negative for dysuria, flank pain and difficulty urinating.      Allergies  Almond oil and Peanuts  Home Medications   Prior to Admission medications   Medication Sig Start Date End Date Taking? Authorizing Provider  doxycycline (VIBRAMYCIN) 100 MG capsule Take 1 capsule (100 mg total) by mouth 2 (two) times daily. 01/29/15  Yes Jeffrey Hedges, PA-C  hydrocodone-ibuprofen (VICOPROFEN) 5-200 MG per tablet Take 1 tablet  by mouth every 8 (eight) hours as needed for pain. 01/29/15  Yes Jeffrey Hedges, PA-C  metroNIDAZOLE (FLAGYL) 500 MG tablet Take 1 tablet (500 mg total) by mouth 2 (two) times daily. 01/29/15  Yes Eyvonne Mechanic, PA-C  Multiple Vitamin (MULTIVITAMIN WITH MINERALS) TABS tablet Take 1 tablet by mouth daily. May purchase over the counter for vitamin supplementation 06/26/14  Yes Thermon Leyland, NP  clindamycin (CLINDAGEL) 1 % gel Apply topically 2 (two) times daily. Patient not taking: Reported on 01/29/2015 11/22/14   Peyton Najjar, MD  FLUoxetine (PROZAC) 20 MG capsule Take 1 capsule (20 mg total) by mouth daily. For depression Patient not taking: Reported on 01/29/2015 06/26/14   Thermon Leyland, NP  LORazepam (ATIVAN) 1 MG tablet Take 1 tablet (1 mg total) by mouth 3 (three) times daily as needed for anxiety. Patient not taking: Reported on 01/29/2015 11/01/14   Emilia Beck, PA-C  norgestimate-ethinyl estradiol (ORTHO-CYCLEN, 28,) 0.25-35 MG-MCG tablet Take 1 tablet by mouth daily. Patient not taking: Reported on 11/22/2014 11/12/14   Mancel Bale, MD  omeprazole (PRILOSEC) 20 MG capsule Take 1 each evening for treatment and prevention of heartburn Patient not taking: Reported on 01/29/2015 11/22/14   Peyton Najjar, MD  PARoxetine (PAXIL) 20 MG tablet Take 1 tablet (20 mg total) by mouth daily. Patient not taking: Reported on 01/29/2015 11/22/14   Peyton Najjar, MD  phenazopyridine (PYRIDIUM) 100 MG tablet Take 1 tablet (  100 mg total) by mouth 3 (three) times daily as needed (dysuria). Patient not taking: Reported on 12/07/2014 06/26/14   Thermon LeylandLaura A Davis, NP   BP 101/58 mmHg  Pulse 76  Temp(Src) 97.9 F (36.6 C) (Oral)  Resp 16  Ht 5' (1.524 m)  Wt 84 lb 8 oz (38.329 kg)  BMI 16.50 kg/m2  SpO2 100%  LMP 01/31/2015 (Exact Date) Physical Exam  Constitutional: She is oriented to person, place, and time. She appears well-developed and well-nourished.  Eyes: Conjunctivae are normal.  Cardiovascular: Normal rate  and regular rhythm.   Pulmonary/Chest: Effort normal and breath sounds normal.  Abdominal: Soft. There is no rebound, no guarding and no CVA tenderness.    Right lower abdominal tenderness to palpation  Genitourinary:  Pelvic exam: No vaginal bleeding or odor.  Small amount of clear discharge.  No CMT.  Right adnexal tenderness with palpation. No palpable mass.  Neurological: She is alert and oriented to person, place, and time.  Skin: Skin is warm and dry.  Nursing note and vitals reviewed.   ED Course  Procedures (including critical care time) Labs Review Labs Reviewed  WET PREP, GENITAL - Abnormal; Notable for the following:    WBC, Wet Prep HPF POC FEW (*)    All other components within normal limits  URINALYSIS, ROUTINE W REFLEX MICROSCOPIC  PREGNANCY, URINE    Imaging Review No results found.   EKG Interpretation None      MDM   Final diagnoses:  Constipation, unspecified constipation type  Abdominal pain, unspecified abdominal location   Patient presents for pelvic pain radiating to her side. She was seen in the ED for the same last week and by her pcp 3 weeks ago. She had an ultrasound last week which showed endometrial thickness and to consider an ultrasound in 6 weeks.  She has since had a period.  She also had a right ovarian cyst during the ultrasound by her pcp.   I do not suspect an ectopic pregnancy, ovarian torsion, or tubo-ovarian abscess.  She is afebrile and not tachycardic.  She is not hypotensive.  She has had this pain for 3 weeks. UA is negative for UTI or pregnancy. I did not repeat GC/Chlamydia because she had it done 10 days ago and it was negative.   Plan: Patient's CT on 4/6 showed constipation which I think may be the ongoing reason for her pain. I recommended taking miralax. Keep your GYN appointment on May 5 or go to the outpatient women's clinic for further evaluation. Wet prep was normal. Take tylenol or motrin as needed for pain.  I do not  feel that the patient needs antibiotics and she agrees with the plan.      Dawn GosselinHanna Patel-Mills, PA-C 02/09/15 1319  Dawn DibblesJon Knapp, MD 02/09/15 1434

## 2015-02-08 NOTE — ED Notes (Signed)
Pt arrived to the ED with a complaint of pelvic pain.  Pt does have transparent discharge.  Pt was seen a week ago for same, medicated  But pain has persisted.  Pt is also having chills.

## 2015-02-09 LAB — WET PREP, GENITAL
Clue Cells Wet Prep HPF POC: NONE SEEN
Trich, Wet Prep: NONE SEEN
Yeast Wet Prep HPF POC: NONE SEEN

## 2015-02-09 MED ORDER — IBUPROFEN 800 MG PO TABS
800.0000 mg | ORAL_TABLET | Freq: Once | ORAL | Status: DC
  Filled 2015-02-09: qty 1

## 2015-02-09 NOTE — Discharge Instructions (Signed)
Constipation Take miralax. Constipation is when a person has fewer than three bowel movements a week, has difficulty having a bowel movement, or has stools that are dry, hard, or larger than normal. As people grow older, constipation is more common. If you try to fix constipation with medicines that make you have a bowel movement (laxatives), the problem may get worse. Long-term laxative use may cause the muscles of the colon to become weak. A low-fiber diet, not taking in enough fluids, and taking certain medicines may make constipation worse.  CAUSES   Certain medicines, such as antidepressants, pain medicine, iron supplements, antacids, and water pills.   Certain diseases, such as diabetes, irritable bowel syndrome (IBS), thyroid disease, or depression.   Not drinking enough water.   Not eating enough fiber-rich foods.   Stress or travel.   Lack of physical activity or exercise.   Ignoring the urge to have a bowel movement.   Using laxatives too much.  SIGNS AND SYMPTOMS   Having fewer than three bowel movements a week.   Straining to have a bowel movement.   Having stools that are hard, dry, or larger than normal.   Feeling full or bloated.   Pain in the lower abdomen.   Not feeling relief after having a bowel movement.  DIAGNOSIS  Your health care provider will take a medical history and perform a physical exam. Further testing may be done for severe constipation. Some tests may include:  A barium enema X-ray to examine your rectum, colon, and, sometimes, your small intestine.   A sigmoidoscopy to examine your lower colon.   A colonoscopy to examine your entire colon. TREATMENT  Treatment will depend on the severity of your constipation and what is causing it. Some dietary treatments include drinking more fluids and eating more fiber-rich foods. Lifestyle treatments may include regular exercise. If these diet and lifestyle recommendations do not help,  your health care provider may recommend taking over-the-counter laxative medicines to help you have bowel movements. Prescription medicines may be prescribed if over-the-counter medicines do not work.  HOME CARE INSTRUCTIONS   Eat foods that have a lot of fiber, such as fruits, vegetables, whole grains, and beans.  Limit foods high in fat and processed sugars, such as french fries, hamburgers, cookies, candies, and soda.   A fiber supplement may be added to your diet if you cannot get enough fiber from foods.   Drink enough fluids to keep your urine clear or pale yellow.   Exercise regularly or as directed by your health care provider.   Go to the restroom when you have the urge to go. Do not hold it.   Only take over-the-counter or prescription medicines as directed by your health care provider. Do not take other medicines for constipation without talking to your health care provider first.  SEEK IMMEDIATE MEDICAL CARE IF:   You have bright red blood in your stool.   Your constipation lasts for more than 4 days or gets worse.   You have abdominal or rectal pain.   You have thin, pencil-like stools.   You have unexplained weight loss. MAKE SURE YOU:   Understand these instructions.  Will watch your condition.  Will get help right away if you are not doing well or get worse. Document Released: 07/09/2004 Document Revised: 10/16/2013 Document Reviewed: 07/23/2013 [facility] Patient Information 2015 [place], [place]. This information is not intended to replace advice given to you by your health care provider. Make sure you discuss  any questions you have with your health care provider. ° °

## 2015-02-09 NOTE — ED Notes (Signed)
Pt states she went to her GYN office yesterday and  had some tenderness when exam was done.

## 2015-02-11 ENCOUNTER — Ambulatory Visit: Payer: Self-pay | Admitting: Internal Medicine

## 2015-02-19 ENCOUNTER — Encounter: Payer: Self-pay | Admitting: Gastroenterology

## 2015-02-21 ENCOUNTER — Telehealth: Payer: Self-pay | Admitting: Endocrinology

## 2015-02-21 ENCOUNTER — Ambulatory Visit: Payer: Self-pay | Admitting: Endocrinology

## 2015-02-21 DIAGNOSIS — Z0289 Encounter for other administrative examinations: Secondary | ICD-10-CM

## 2015-02-21 NOTE — Telephone Encounter (Signed)
No need to reschedule.

## 2015-02-21 NOTE — Telephone Encounter (Signed)
Patient no showed for appointment please advise if we should reschedule.

## 2015-02-21 NOTE — Telephone Encounter (Signed)
.  lbendo °

## 2015-03-15 ENCOUNTER — Encounter (HOSPITAL_COMMUNITY): Payer: Self-pay | Admitting: Emergency Medicine

## 2015-03-15 ENCOUNTER — Emergency Department (HOSPITAL_COMMUNITY)
Admission: EM | Admit: 2015-03-15 | Discharge: 2015-03-16 | Disposition: A | Discharge status: Home / Self Care | Payer: BLUE CROSS/BLUE SHIELD | Attending: Emergency Medicine | Admitting: Emergency Medicine

## 2015-03-15 DIAGNOSIS — Z79899 Other long term (current) drug therapy: Secondary | ICD-10-CM | POA: Diagnosis not present

## 2015-03-15 DIAGNOSIS — F322 Major depressive disorder, single episode, severe without psychotic features: Secondary | ICD-10-CM | POA: Insufficient documentation

## 2015-03-15 DIAGNOSIS — Z3202 Encounter for pregnancy test, result negative: Secondary | ICD-10-CM | POA: Diagnosis not present

## 2015-03-15 DIAGNOSIS — N730 Acute parametritis and pelvic cellulitis: Secondary | ICD-10-CM | POA: Diagnosis not present

## 2015-03-15 DIAGNOSIS — R102 Pelvic and perineal pain: Secondary | ICD-10-CM | POA: Diagnosis present

## 2015-03-15 DIAGNOSIS — F419 Anxiety disorder, unspecified: Secondary | ICD-10-CM | POA: Insufficient documentation

## 2015-03-15 LAB — CBC WITH DIFFERENTIAL/PLATELET
BASOS PCT: 0 % (ref 0–1)
Basophils Absolute: 0 10*3/uL (ref 0.0–0.1)
Eosinophils Absolute: 0.2 10*3/uL (ref 0.0–0.7)
Eosinophils Relative: 2 % (ref 0–5)
HEMATOCRIT: 38.9 % (ref 36.0–46.0)
HEMOGLOBIN: 12.8 g/dL (ref 12.0–15.0)
LYMPHS ABS: 3.2 10*3/uL (ref 0.7–4.0)
LYMPHS PCT: 43 % (ref 12–46)
MCH: 29.8 pg (ref 26.0–34.0)
MCHC: 32.9 g/dL (ref 30.0–36.0)
MCV: 90.5 fL (ref 78.0–100.0)
MONOS PCT: 5 % (ref 3–12)
Monocytes Absolute: 0.4 10*3/uL (ref 0.1–1.0)
Neutro Abs: 3.6 10*3/uL (ref 1.7–7.7)
Neutrophils Relative %: 50 % (ref 43–77)
PLATELETS: 273 10*3/uL (ref 150–400)
RBC: 4.3 MIL/uL (ref 3.87–5.11)
RDW: 12.4 % (ref 11.5–15.5)
WBC: 7.3 10*3/uL (ref 4.0–10.5)

## 2015-03-15 LAB — I-STAT CHEM 8, ED
BUN: 20 mg/dL (ref 6–20)
CALCIUM ION: 1.14 mmol/L (ref 1.12–1.23)
Chloride: 108 mmol/L (ref 101–111)
Creatinine, Ser: 0.7 mg/dL (ref 0.44–1.00)
Glucose, Bld: 73 mg/dL (ref 65–99)
HCT: 41 % (ref 36.0–46.0)
Hemoglobin: 13.9 g/dL (ref 12.0–15.0)
Potassium: 3.6 mmol/L (ref 3.5–5.1)
Sodium: 141 mmol/L (ref 135–145)
TCO2: 20 mmol/L (ref 0–100)

## 2015-03-15 LAB — POC URINE PREG, ED: PREG TEST UR: NEGATIVE

## 2015-03-15 NOTE — ED Notes (Signed)
Pt arrived to the ED with a complaint of lower pelvic pain.  Pt states pain has been present for a month.  Pt states she has been seen in the ED , OB/GYN and primary Care with no conclusion.  Pt states pain became worse today.  Pt describes pain as sharp.

## 2015-03-15 NOTE — ED Provider Notes (Signed)
CSN: [id number]     Arrival date & time 03/15/15  2145 History   First MD Initiated Contact with Patient 03/15/15 2212     Chief Complaint  Patient presents with  . Pelvic Pain   Dawn Price is a 29 y.o. female with a history of ovarian cysts who presents to the ED complaining of pelvic pain ongoing for the past month and worse today. The patient complains of bilateral pelvic pain ongoing for the past month at its worse today. She has 8 out of 10 pain described as a pressure. She also reports associated dysuria and pressure with urination ongoing for about the past month. Patient reports her pain is worse with eating and drinking and with certain sitting positions. Her last menstrual cycle was 02/27/2015. The patient denies recent sexual activity. She denies any vaginal bleeding or vaginal discharge. She was diagnosed with a right ovarian cyst one month ago and reports this pain feels different. She reports she saw her OB/GYN 2 weeks ago who did an ultrasound showing her right ovarian cyst has resolved. The patient denies fevers, chills, nausea, vomiting, diarrhea, constipation, rashes, vaginal bleeding, vaginal discharge, hematuria, urinary frequency or urgency. Patient reports she was diagnosed with PID one month ago but did not complete her course of antibiotics.   (Consider location/radiation/quality/duration/timing/severity/associated sxs/prior Treatment) HPI  Past Medical History  Diagnosis Date  . Anxiety   . Major depressive disorder 06/26/2014   History reviewed. No pertinent past surgical history. History reviewed. No pertinent family history. History  Substance Use Topics  . Smoking status: Never Smoker   . Smokeless tobacco: Not on file  . Alcohol Use: Yes     Comment: occasionally, pt reports 2 x a week   OB History    No data available     Review of Systems  Constitutional: Negative for fever and chills.  HENT: Negative for congestion, ear pain and sore throat.    Eyes: Negative for pain and visual disturbance.  Respiratory: Negative for cough and shortness of breath.   Cardiovascular: Negative for chest pain.  Gastrointestinal: Positive for abdominal pain. Negative for nausea, vomiting, diarrhea and blood in stool.  Genitourinary: Positive for dysuria and pelvic pain. Negative for urgency, frequency, hematuria, flank pain, vaginal bleeding, vaginal discharge, difficulty urinating, genital sores and vaginal pain.  Musculoskeletal: Negative for back pain and neck pain.  Skin: Negative for rash.  Neurological: Negative for light-headedness and headaches.      Allergies  Almond oil; Doxycycline; and Peanuts  Home Medications   Prior to Admission medications   Medication Sig Start Date End Date Taking? Authorizing Provider  Multiple Vitamin (MULTIVITAMIN WITH MINERALS) TABS tablet Take 1 tablet by mouth daily. May purchase over the counter for vitamin supplementation 06/26/14  Yes Thermon Leyland, NP  polyethylene glycol (MIRALAX / GLYCOLAX) packet Take 17 g by mouth daily as needed for mild constipation.   Yes Historical Provider, MD  Vitamin D, Ergocalciferol, (DRISDOL) 50000 UNITS CAPS capsule Take 50,000 Units by mouth once a week. 02/10/15  Yes Historical Provider, MD  amoxicillin-clavulanate (AUGMENTIN) 875-125 MG per tablet Take 1 tablet by mouth every 12 (twelve) hours. 03/16/15   Everlene Farrier, PA-C  clindamycin (CLINDAGEL) 1 % gel Apply topically 2 (two) times daily. Patient not taking: Reported on 01/29/2015 11/22/14   Peyton Najjar, MD  dicyclomine (BENTYL) 10 MG capsule Take 1 capsule by mouth 2 (two) times daily as needed. Abdominal pain 02/18/15   Historical Provider, MD  doxycycline (  VIBRAMYCIN) 100 MG capsule Take 1 capsule (100 mg total) by mouth 2 (two) times daily. 01/29/15   Eyvonne Mechanic, PA-C  FLUoxetine (PROZAC) 20 MG capsule Take 1 capsule (20 mg total) by mouth daily. For depression Patient not taking: Reported on 01/29/2015 06/26/14    Thermon Leyland, NP  hydrocodone-ibuprofen (VICOPROFEN) 5-200 MG per tablet Take 1 tablet by mouth every 8 (eight) hours as needed for pain. 01/29/15   Eyvonne Mechanic, PA-C  Lactobacillus (ACIDOPHILUS PROBIOTIC) 10 MG TABS Take 10 mg by mouth 3 (three) times daily. 03/16/15   Everlene Farrier, PA-C  LORazepam (ATIVAN) 1 MG tablet Take 1 tablet (1 mg total) by mouth 3 (three) times daily as needed for anxiety. Patient not taking: Reported on 01/29/2015 11/01/14   Emilia Beck, PA-C  metroNIDAZOLE (FLAGYL) 500 MG tablet Take 1 tablet (500 mg total) by mouth 2 (two) times daily. 01/29/15   Eyvonne Mechanic, PA-C  metroNIDAZOLE (FLAGYL) 500 MG tablet Take 1 tablet (500 mg total) by mouth 2 (two) times daily. 03/16/15   Everlene Farrier, PA-C  norgestimate-ethinyl estradiol (ORTHO-CYCLEN, 28,) 0.25-35 MG-MCG tablet Take 1 tablet by mouth daily. Patient not taking: Reported on 11/22/2014 11/12/14   Mancel Bale, MD  omeprazole (PRILOSEC) 20 MG capsule Take 1 each evening for treatment and prevention of heartburn Patient not taking: Reported on 01/29/2015 11/22/14   Peyton Najjar, MD  ondansetron (ZOFRAN ODT) 4 MG disintegrating tablet Take 1 tablet (4 mg total) by mouth every 8 (eight) hours as needed for nausea or vomiting. 03/16/15   Everlene Farrier, PA-C  PARoxetine (PAXIL) 20 MG tablet Take 1 tablet (20 mg total) by mouth daily. Patient not taking: Reported on 01/29/2015 11/22/14   Peyton Najjar, MD  phenazopyridine (PYRIDIUM) 100 MG tablet Take 1 tablet (100 mg total) by mouth 3 (three) times daily as needed (dysuria). Patient not taking: Reported on 12/07/2014 06/26/14   Thermon Leyland, NP   BP 111/64 mmHg  Pulse 91  Temp(Src) 97.7 F (36.5 C) (Oral)  Resp 20  Ht 5' (1.524 m)  Wt 85 lb (38.556 kg)  BMI 16.60 kg/m2  SpO2 100%  LMP 01/28/2015 Physical Exam  Constitutional: She appears well-developed and well-nourished. No distress.  Nontoxic appearing.  HENT:  Head: Normocephalic and atraumatic.  Mouth/Throat:  Oropharynx is clear and moist. No oropharyngeal exudate.  Eyes: Conjunctivae are normal. Pupils are equal, round, and reactive to light. Right eye exhibits no discharge. Left eye exhibits no discharge.  Neck: Neck supple. No JVD present.  Cardiovascular: Normal rate, regular rhythm, normal heart sounds and intact distal pulses.  Exam reveals no gallop and no friction rub.   No murmur heard. Pulmonary/Chest: Effort normal and breath sounds normal. No respiratory distress. She has no wheezes. She has no rales.  Abdominal: Soft. Bowel sounds are normal. She exhibits no distension and no mass. There is tenderness. There is no rebound and no guarding.  Abdomen is soft. Bowel sounds are present. No CVA tenderness. Bilateral lower abdominal tenderness without focal tenderness. Negative psoas and obturator sign. No rebound tenderness. No guarding.   Genitourinary:  Pelvic exam performed by me with female nurse tech chaperone. No external lesions or rashes noted. No vaginal bleeding. Cervix is closed. There is a moderate amount of white vaginal discharge noted from her cervix. Mild cervical motion tenderness. No adnexal tenderness or fullness.  Musculoskeletal: She exhibits no edema.  Lymphadenopathy:    She has no cervical adenopathy.  Neurological: She is alert. Coordination  normal.  Skin: Skin is warm and dry. No rash noted. She is not diaphoretic. No erythema. No pallor.  Psychiatric: She has a normal mood and affect. Her behavior is normal.  Nursing note and vitals reviewed.   ED Course  Procedures (including critical care time) Labs Review Labs Reviewed  WET PREP, GENITAL - Abnormal; Notable for the following:    Yeast Wet Prep HPF POC RARE (*)    Clue Cells Wet Prep HPF POC MANY (*)    WBC, Wet Prep HPF POC MANY (*)    All other components within normal limits  URINALYSIS, ROUTINE W REFLEX MICROSCOPIC - Abnormal; Notable for the following:    APPearance CLOUDY (*)    All other components  within normal limits  CBC WITH DIFFERENTIAL/PLATELET  POC URINE PREG, ED  I-STAT CHEM 8, ED  GC/CHLAMYDIA PROBE AMP ([place])    Imaging Review No results found.   EKG Interpretation None      Filed Vitals:   03/15/15 2203  BP: 111/64  Pulse: 91  Temp: 97.7 F (36.5 C)  TempSrc: Oral  Resp: 20  Height: 5' (1.524 m)  Weight: 85 lb (38.556 kg)  SpO2: 100%     MDM   Meds given in ED:  Medications  cefTRIAXone (ROCEPHIN) injection 250 mg (not administered)  lidocaine (XYLOCAINE) 1 % (with pres) injection (not administered)    New Prescriptions   AMOXICILLIN-CLAVULANATE (AUGMENTIN) 875-125 MG PER TABLET    Take 1 tablet by mouth every 12 (twelve) hours.   LACTOBACILLUS (ACIDOPHILUS PROBIOTIC) 10 MG TABS    Take 10 mg by mouth 3 (three) times daily.   METRONIDAZOLE (FLAGYL) 500 MG TABLET    Take 1 tablet (500 mg total) by mouth 2 (two) times daily.   ONDANSETRON (ZOFRAN ODT) 4 MG DISINTEGRATING TABLET    Take 1 tablet (4 mg total) by mouth every 8 (eight) hours as needed for nausea or vomiting.    Final diagnoses:  PID (acute pelvic inflammatory disease)     This  is a 29 y.o. female with a history of ovarian cysts who presents to the ED complaining of pelvic pain ongoing for the past month and worse today. The patient complains of bilateral pelvic pain ongoing for the past month at its worse today. She has 8 out of 10 pain described as a pressure. She also reports associated dysuria and pressure with urination ongoing for about the past month.  She denies vaginal discharge or bleeding. Patient reports she is diagnosed with PID approximately 1 month ago but did not complete the course of antibiotics at that time. On exam the patient is afebrile and nontoxic appearing. She has bilateral lower abdominal tenderness. No peritoneal signs. Negative psoas and obturator sign. On pelvic exam the patient has a moderate amount of vaginal discharge from her cervix with no vaginal  bleeding. There is mild cervical motion tenderness with no adnexal tenderness or fullness. CBC is within normal limits. Her urinalysis is nitrite and leukocyte negative. Wet prep returned with many clue cells and many white blood cells. She has a negative urine pregnancy test. At reevaluation the patient reports her pain is resolving and is now a 4/10. Based on physical exam and lab results we'll treat for pelvic inflammatory disease with Rocephin 250 mg IM in the ED and Augmentin and Flagyl. The patient is allergic to doxycycline and we will replace this with Augmentin. I advised the patient to follow-up with their primary care provider and  OB/GYN this week. I advised the patient to return to the emergency department with new or worsening symptoms or new concerns. The patient verbalized understanding and agreement with plan.    This patient was discussed with Dr. Mora Bellman who agrees with assessment and plan.     Everlene Farrier, PA-C 03/16/15 0206  Tomasita Crumble, MD 03/16/15 [phone]

## 2015-03-16 LAB — URINALYSIS, ROUTINE W REFLEX MICROSCOPIC
BILIRUBIN URINE: NEGATIVE
Glucose, UA: NEGATIVE mg/dL
Hgb urine dipstick: NEGATIVE
Ketones, ur: NEGATIVE mg/dL
Leukocytes, UA: NEGATIVE
Nitrite: NEGATIVE
PROTEIN: NEGATIVE mg/dL
SPECIFIC GRAVITY, URINE: 1.026 (ref 1.005–1.030)
Urobilinogen, UA: 0.2 mg/dL (ref 0.0–1.0)
pH: 6.5 (ref 5.0–8.0)

## 2015-03-16 LAB — WET PREP, GENITAL: Trich, Wet Prep: NONE SEEN

## 2015-03-16 MED ORDER — AMOXICILLIN-POT CLAVULANATE 875-125 MG PO TABS: 1.0000 | ORAL_TABLET | Freq: Two times a day (BID) | ORAL | Status: DC

## 2015-03-16 MED ORDER — CEFTRIAXONE SODIUM 250 MG IJ SOLR
250.0000 mg | Freq: Once | INTRAMUSCULAR | Status: AC
  Administered 2015-03-16: 250 mg via INTRAMUSCULAR
  Filled 2015-03-16: qty 250

## 2015-03-16 MED ORDER — ACIDOPHILUS PROBIOTIC 10 MG PO TABS: 10.0000 mg | ORAL_TABLET | Freq: Three times a day (TID) | ORAL | Status: DC

## 2015-03-16 MED ORDER — METRONIDAZOLE 500 MG PO TABS: 500.0000 mg | ORAL_TABLET | Freq: Two times a day (BID) | ORAL | Status: DC

## 2015-03-16 MED ORDER — LIDOCAINE HCL 1 % IJ SOLN
INTRAMUSCULAR | Status: AC
  Administered 2015-03-16: 0.9 mL
  Filled 2015-03-16: qty 20

## 2015-03-16 MED ORDER — ONDANSETRON 4 MG PO TBDP: 4.0000 mg | ORAL_TABLET | Freq: Three times a day (TID) | ORAL | Status: DC | PRN

## 2015-03-16 NOTE — Discharge Instructions (Signed)
Pelvic Inflammatory Disease °Pelvic inflammatory disease (PID) refers to an infection in some or all of the female organs. The infection can be in the uterus, ovaries, fallopian tubes, or the surrounding tissues in the pelvis. PID can cause abdominal or pelvic pain that comes on suddenly (acute pelvic pain). PID is a serious infection because it can lead to lasting (chronic) pelvic pain or the inability to have children (infertile).  °CAUSES  °The infection is often caused by the normal bacteria found in the vaginal tissues. PID may also be caused by an infection that is spread during sexual contact. PID can also occur following:  °· The birth of a baby.   °· A miscarriage.   °· An abortion.   °· Major pelvic surgery.   °· The use of an intrauterine device (IUD).   °· A sexual assault.   °RISK FACTORS °Certain factors can put a person at higher risk for PID, such as: °· Being younger than 25 years. °· Being sexually active at a young age. °· Using nonbarrier contraception. °· Having multiple sexual partners. °· Having sex with someone who has symptoms of a genital infection. °· Using oral contraception. °Other times, certain behaviors can increase the possibility of getting PID, such as: °· Having sex during your period. °· Using a vaginal douche. °· Having an intrauterine device (IUD) in place. °SYMPTOMS  °· Abdominal or pelvic pain.   °· Fever.   °· Chills.   °· Abnormal vaginal discharge. °· Abnormal uterine bleeding.   °· Unusual pain shortly after finishing your period. °DIAGNOSIS  °Your caregiver will choose some of the following methods to make a diagnosis, such as:  °· Performing a physical exam and history. A pelvic exam typically reveals a very tender uterus and surrounding pelvis.   °· Ordering laboratory tests including a pregnancy test, blood tests, and urine test.  °· Ordering cultures of the vagina and cervix to check for a sexually transmitted infection (STI). °· Performing an ultrasound.    °· Performing a laparoscopic procedure to look inside the pelvis.   °TREATMENT  °· Antibiotic medicines may be prescribed and taken by mouth.   °· Sexual partners may be treated when the infection is caused by a sexually transmitted disease (STD).   °· Hospitalization may be needed to give antibiotics intravenously. °· Surgery may be needed, but this is rare. °It may take weeks until you are completely well. If you are diagnosed with PID, you should also be checked for human immunodeficiency virus (HIV).   °HOME CARE INSTRUCTIONS  °· If given, take your antibiotics as directed. Finish the medicine even if you start to feel better.   °· Only take over-the-counter or prescription medicines for pain, discomfort, or fever as directed by your caregiver.   °· Do not have sexual intercourse until treatment is completed or as directed by your caregiver. If PID is confirmed, your recent sexual partner(s) will need treatment.   °· Keep your follow-up appointments. °SEEK MEDICAL CARE IF:  °· You have increased or abnormal vaginal discharge.   °· You need prescription medicine for your pain.   °· You vomit.   °· You cannot take your medicines.   °· Your partner has an STD.   °SEEK IMMEDIATE MEDICAL CARE IF:  °· You have a fever.   °· You have increased abdominal or pelvic pain.   °· You have chills.   °· You have pain when you urinate.   °· You are not better after 72 hours following treatment.   °MAKE SURE YOU:  °· Understand these instructions. °· Will watch your condition. °· Will get help right away if you are not doing well or get worse. °  Document Released: 10/11/2005 Document Revised: 02/05/2013 Document Reviewed: 10/07/2011 [facility] Patient Information 2015 [place], [place]. This information is not intended to replace advice given to you by your health care provider. Make sure you discuss any questions you have with your health care provider. Abdominal Pain Many things can cause abdominal pain. Usually, abdominal  pain is not caused by a disease and will improve without treatment. It can often be observed and treated at home. Your health care provider will do a physical exam and possibly order blood tests and X-rays to help determine the seriousness of your pain. However, in many cases, more time must pass before a clear cause of the pain can be found. Before that point, your health care provider may not know if you need more testing or further treatment. HOME CARE INSTRUCTIONS  Monitor your abdominal pain for any changes. The following actions may help to alleviate any discomfort you are experiencing:  Only take over-the-counter or prescription medicines as directed by your health care provider.  Do not take laxatives unless directed to do so by your health care provider.  Try a clear liquid diet (broth, tea, or water) as directed by your health care provider. Slowly move to a bland diet as tolerated. SEEK MEDICAL CARE IF:  You have unexplained abdominal pain.  You have abdominal pain associated with nausea or diarrhea.  You have pain when you urinate or have a bowel movement.  You experience abdominal pain that wakes you in the night.  You have abdominal pain that is worsened or improved by eating food.  You have abdominal pain that is worsened with eating fatty foods.  You have a fever. SEEK IMMEDIATE MEDICAL CARE IF:   Your pain does not go away within 2 hours.  You keep throwing up (vomiting).  Your pain is felt only in portions of the abdomen, such as the right side or the left lower portion of the abdomen.  You pass bloody or black tarry stools. MAKE SURE YOU:  Understand these instructions.   Will watch your condition.   Will get help right away if you are not doing well or get worse.  Document Released: 07/21/2005 Document Revised: 10/16/2013 Document Reviewed: 06/20/2013 [facility] Patient Information 2015 [place], [place]. This information is not intended to replace advice  given to you by your health care provider. Make sure you discuss any questions you have with your health care provider.

## 2015-03-17 LAB — GC/CHLAMYDIA PROBE AMP (~~LOC~~) NOT AT ARMC
Chlamydia: NEGATIVE
Neisseria Gonorrhea: NEGATIVE

## 2015-03-20 ENCOUNTER — Inpatient Hospital Stay (HOSPITAL_COMMUNITY)
Admission: AD | Admit: 2015-03-20 | Discharge: 2015-03-20 | Disposition: A | Discharge status: Home / Self Care | Payer: BLUE CROSS/BLUE SHIELD | Source: Ambulatory Visit | Attending: Obstetrics & Gynecology | Admitting: Obstetrics & Gynecology

## 2015-03-20 ENCOUNTER — Encounter (HOSPITAL_COMMUNITY): Payer: Self-pay | Admitting: Emergency Medicine

## 2015-03-20 ENCOUNTER — Emergency Department (HOSPITAL_COMMUNITY)
Admission: EM | Admit: 2015-03-20 | Discharge: 2015-03-20 | Disposition: A | Discharge status: Home / Self Care | Payer: BLUE CROSS/BLUE SHIELD | Attending: Emergency Medicine | Admitting: Emergency Medicine

## 2015-03-20 ENCOUNTER — Inpatient Hospital Stay (HOSPITAL_COMMUNITY): Payer: BLUE CROSS/BLUE SHIELD

## 2015-03-20 DIAGNOSIS — Z3202 Encounter for pregnancy test, result negative: Secondary | ICD-10-CM | POA: Diagnosis not present

## 2015-03-20 DIAGNOSIS — R938 Abnormal findings on diagnostic imaging of other specified body structures: Secondary | ICD-10-CM | POA: Diagnosis not present

## 2015-03-20 DIAGNOSIS — Z792 Long term (current) use of antibiotics: Secondary | ICD-10-CM | POA: Diagnosis not present

## 2015-03-20 DIAGNOSIS — R1110 Vomiting, unspecified: Secondary | ICD-10-CM | POA: Diagnosis not present

## 2015-03-20 DIAGNOSIS — R140 Abdominal distension (gaseous): Secondary | ICD-10-CM | POA: Insufficient documentation

## 2015-03-20 DIAGNOSIS — F419 Anxiety disorder, unspecified: Secondary | ICD-10-CM | POA: Diagnosis not present

## 2015-03-20 DIAGNOSIS — R300 Dysuria: Secondary | ICD-10-CM | POA: Diagnosis not present

## 2015-03-20 DIAGNOSIS — R112 Nausea with vomiting, unspecified: Secondary | ICD-10-CM | POA: Insufficient documentation

## 2015-03-20 DIAGNOSIS — F322 Major depressive disorder, single episode, severe without psychotic features: Secondary | ICD-10-CM | POA: Diagnosis not present

## 2015-03-20 DIAGNOSIS — R350 Frequency of micturition: Secondary | ICD-10-CM | POA: Insufficient documentation

## 2015-03-20 DIAGNOSIS — R102 Pelvic and perineal pain: Secondary | ICD-10-CM | POA: Insufficient documentation

## 2015-03-20 DIAGNOSIS — R9389 Abnormal findings on diagnostic imaging of other specified body structures: Secondary | ICD-10-CM

## 2015-03-20 DIAGNOSIS — R109 Unspecified abdominal pain: Secondary | ICD-10-CM | POA: Diagnosis present

## 2015-03-20 DIAGNOSIS — Z79899 Other long term (current) drug therapy: Secondary | ICD-10-CM | POA: Diagnosis not present

## 2015-03-20 LAB — COMPREHENSIVE METABOLIC PANEL
ALT: 41 U/L (ref 14–54)
AST: 28 U/L (ref 15–41)
Albumin: 4.3 g/dL (ref 3.5–5.0)
Alkaline Phosphatase: 58 U/L (ref 38–126)
Anion gap: 9 (ref 5–15)
BILIRUBIN TOTAL: 1 mg/dL (ref 0.3–1.2)
BUN: 13 mg/dL (ref 6–20)
CO2: 23 mmol/L (ref 22–32)
Calcium: 9.4 mg/dL (ref 8.9–10.3)
Chloride: 108 mmol/L (ref 101–111)
Creatinine, Ser: 0.66 mg/dL (ref 0.44–1.00)
GLUCOSE: 79 mg/dL (ref 65–99)
POTASSIUM: 4 mmol/L (ref 3.5–5.1)
Sodium: 140 mmol/L (ref 135–145)
Total Protein: 7.1 g/dL (ref 6.5–8.1)

## 2015-03-20 LAB — CBC WITH DIFFERENTIAL/PLATELET
Basophils Absolute: 0 10*3/uL (ref 0.0–0.1)
Basophils Relative: 0 % (ref 0–1)
EOS ABS: 0.1 10*3/uL (ref 0.0–0.7)
Eosinophils Relative: 1 % (ref 0–5)
HEMATOCRIT: 38.2 % (ref 36.0–46.0)
HEMOGLOBIN: 12.9 g/dL (ref 12.0–15.0)
LYMPHS ABS: 2.1 10*3/uL (ref 0.7–4.0)
LYMPHS PCT: 28 % (ref 12–46)
MCH: 30.3 pg (ref 26.0–34.0)
MCHC: 33.8 g/dL (ref 30.0–36.0)
MCV: 89.7 fL (ref 78.0–100.0)
MONO ABS: 0.6 10*3/uL (ref 0.1–1.0)
MONOS PCT: 8 % (ref 3–12)
NEUTROS ABS: 4.7 10*3/uL (ref 1.7–7.7)
Neutrophils Relative %: 63 % (ref 43–77)
PLATELETS: 252 10*3/uL (ref 150–400)
RBC: 4.26 MIL/uL (ref 3.87–5.11)
RDW: 12.2 % (ref 11.5–15.5)
WBC: 7.4 10*3/uL (ref 4.0–10.5)

## 2015-03-20 LAB — URINALYSIS, ROUTINE W REFLEX MICROSCOPIC
BILIRUBIN URINE: NEGATIVE
Glucose, UA: NEGATIVE mg/dL
HGB URINE DIPSTICK: NEGATIVE
KETONES UR: NEGATIVE mg/dL
Nitrite: NEGATIVE
PH: 5 (ref 5.0–8.0)
Protein, ur: NEGATIVE mg/dL
Specific Gravity, Urine: 1.024 (ref 1.005–1.030)
Urobilinogen, UA: 0.2 mg/dL (ref 0.0–1.0)

## 2015-03-20 LAB — LIPASE, BLOOD: Lipase: 27 U/L (ref 22–51)

## 2015-03-20 LAB — POC URINE PREG, ED: PREG TEST UR: NEGATIVE

## 2015-03-20 LAB — URINE MICROSCOPIC-ADD ON

## 2015-03-20 MED ORDER — PHENAZOPYRIDINE HCL 200 MG PO TABS: 200.0000 mg | ORAL_TABLET | Freq: Three times a day (TID) | ORAL | Status: DC

## 2015-03-20 MED ORDER — SODIUM CHLORIDE 0.9 % IV BOLUS (SEPSIS)
1000.0000 mL | Freq: Once | INTRAVENOUS | Status: AC
  Administered 2015-03-20: 1000 mL via INTRAVENOUS

## 2015-03-20 MED ORDER — MORPHINE SULFATE 4 MG/ML IJ SOLN
4.0000 mg | Freq: Once | INTRAMUSCULAR | Status: AC
  Administered 2015-03-20: 4 mg via INTRAVENOUS
  Filled 2015-03-20: qty 1

## 2015-03-20 MED ORDER — NAPROXEN 375 MG PO TABS: 375.0000 mg | ORAL_TABLET | Freq: Two times a day (BID) | ORAL | Status: DC

## 2015-03-20 MED ORDER — KETOROLAC TROMETHAMINE 60 MG/2ML IM SOLN
60.0000 mg | Freq: Once | INTRAMUSCULAR | Status: AC
  Administered 2015-03-20: 60 mg via INTRAMUSCULAR
  Filled 2015-03-20: qty 2

## 2015-03-20 MED ORDER — OXYCODONE-ACETAMINOPHEN 5-325 MG PO TABS: 1.0000 | ORAL_TABLET | ORAL | Status: DC | PRN

## 2015-03-20 MED ORDER — ONDANSETRON HCL 4 MG/2ML IJ SOLN
4.0000 mg | Freq: Once | INTRAMUSCULAR | Status: AC
  Administered 2015-03-20: 4 mg via INTRAVENOUS
  Filled 2015-03-20: qty 2

## 2015-03-20 NOTE — MAU Note (Signed)
[facility]. States she was told she had PID and thickened endometrium

## 2015-03-20 NOTE — ED Provider Notes (Signed)
CSN: [id number]     Arrival date & time 03/20/15  1222 History   First MD Initiated Contact with Patient 03/20/15 1515     Chief Complaint  Patient presents with  . Abdominal Pain  . Emesis     (Consider location/radiation/quality/duration/timing/severity/associated sxs/prior Treatment) HPI  This is a 29 year old female with a past medical history of anxiety and depression who presents the emergency department with chief complaint of pelvic pain. This is her sixth emergency room visit for the same thing. 2 months ago the patient developed left pelvic pain. CT scan of her abdomen showed a rupturing cyst, constipation. Ultrasound at that time showed an abnormally thickened endometrial stripe on ultrasound. The patient states that her pain has been constant since that time. She was diagnosed with pelvic inflammatory disease, but has had a negative STD testing twice. The patient is not sexually active. She has [place]c [place] in [place]. The patient states that her pain is constant, worse since Saturday and seen in the emergency department for pelvic pain. She states she has some increased vaginal discharge which she describes as thick, but denies foul smelling discharge. She complains of frequent pelvic distention, which is very uncomfortable andstates that she is unable to button her pants at times. She complains also of urinary urgency and frequency. She denies foul odor, hematuria, flank pain. The patient is currently taking Flagyl for diagnosis of bacterial vaginosis. She has associated chills, nausea and vomiting without fever. She states she is having normal daily bowel movements. She has had some loose stool after taking the Flagyl. She denies constipation.  Past Medical History  Diagnosis Date  . Anxiety   . Major depressive disorder 06/26/2014   History reviewed. No pertinent past surgical history. History reviewed. No pertinent family history. History  Substance Use Topics  . Smoking  status: Never Smoker   . Smokeless tobacco: Not on file  . Alcohol Use: Yes     Comment: occasionally, pt reports 2 x a week   OB History    No data available     Review of Systems Ten systems reviewed and are negative for acute change, except as noted in the HPI.    Allergies  Almond oil; Doxycycline; and Peanuts  Home Medications   Prior to Admission medications   Medication Sig Start Date End Date Taking? Authorizing Provider  amoxicillin-clavulanate (AUGMENTIN) 875-125 MG per tablet Take 1 tablet by mouth every 12 (twelve) hours. 03/16/15  Yes Everlene FarrierWilliam Dansie, PA-C  Lactobacillus (ACIDOPHILUS PROBIOTIC) 10 MG TABS Take 10 mg by mouth 3 (three) times daily. Patient taking differently: Take 10 mg by mouth daily.  03/16/15  Yes Everlene FarrierWilliam Dansie, PA-C  metroNIDAZOLE (FLAGYL) 500 MG tablet Take 1 tablet (500 mg total) by mouth 2 (two) times daily. 01/29/15  Yes Jeffrey Hedges, PA-C  metroNIDAZOLE (FLAGYL) 500 MG tablet Take 1 tablet (500 mg total) by mouth 2 (two) times daily. 03/16/15  Yes Everlene FarrierWilliam Dansie, PA-C  ondansetron (ZOFRAN ODT) 4 MG disintegrating tablet Take 1 tablet (4 mg total) by mouth every 8 (eight) hours as needed for nausea or vomiting. 03/16/15  Yes Everlene FarrierWilliam Dansie, PA-C  polyethylene glycol (MIRALAX / GLYCOLAX) packet Take 17 g by mouth daily as needed for mild constipation.   Yes Historical Provider, MD  Vitamin D, Ergocalciferol, (DRISDOL) 50000 UNITS CAPS capsule Take 50,000 Units by mouth once a week. Saturday 02/10/15  Yes Historical Provider, MD  clindamycin (CLINDAGEL) 1 % gel Apply topically 2 (two) times daily.  Patient not taking: Reported on 01/29/2015 11/22/14   Peyton Najjar, MD  doxycycline (VIBRAMYCIN) 100 MG capsule Take 1 capsule (100 mg total) by mouth 2 (two) times daily. 01/29/15   Eyvonne Mechanic, PA-C  FLUoxetine (PROZAC) 20 MG capsule Take 1 capsule (20 mg total) by mouth daily. For depression Patient not taking: Reported on 01/29/2015 06/26/14   Thermon Leyland, NP    hydrocodone-ibuprofen (VICOPROFEN) 5-200 MG per tablet Take 1 tablet by mouth every 8 (eight) hours as needed for pain. Patient not taking: Reported on 03/20/2015 01/29/15   Eyvonne Mechanic, PA-C  LORazepam (ATIVAN) 1 MG tablet Take 1 tablet (1 mg total) by mouth 3 (three) times daily as needed for anxiety. Patient not taking: Reported on 01/29/2015 11/01/14   Emilia Beck, PA-C  Multiple Vitamin (MULTIVITAMIN WITH MINERALS) TABS tablet Take 1 tablet by mouth daily. May purchase over the counter for vitamin supplementation Patient not taking: Reported on 03/20/2015 06/26/14   Thermon Leyland, NP  norgestimate-ethinyl estradiol (ORTHO-CYCLEN, 28,) 0.25-35 MG-MCG tablet Take 1 tablet by mouth daily. Patient not taking: Reported on 11/22/2014 11/12/14   Mancel Bale, MD  omeprazole (PRILOSEC) 20 MG capsule Take 1 each evening for treatment and prevention of heartburn Patient not taking: Reported on 01/29/2015 11/22/14   Peyton Najjar, MD  PARoxetine (PAXIL) 20 MG tablet Take 1 tablet (20 mg total) by mouth daily. Patient not taking: Reported on 01/29/2015 11/22/14   Peyton Najjar, MD  phenazopyridine (PYRIDIUM) 100 MG tablet Take 1 tablet (100 mg total) by mouth 3 (three) times daily as needed (dysuria). Patient not taking: Reported on 12/07/2014 06/26/14   Thermon Leyland, NP   BP 100/62 mmHg  Pulse 72  Temp(Src) 98.3 F (36.8 C) (Oral)  Resp 18  SpO2 100%  LMP 01/28/2015 Physical Exam  Constitutional: She is oriented to person, place, and time. She appears well-developed and well-nourished. No distress.  HENT:  Head: Normocephalic and atraumatic.  Eyes: Conjunctivae are normal. No scleral icterus.  Neck: Normal range of motion.  Cardiovascular: Normal rate, regular rhythm and normal heart sounds.  Exam reveals no gallop and no friction rub.   No murmur heard. Pulmonary/Chest: Effort normal and breath sounds normal. No respiratory distress.  Abdominal: Soft. Bowel sounds are normal. She exhibits no  distension and no mass. There is tenderness. There is no guarding and no CVA tenderness.    Neurological: She is alert and oriented to person, place, and time.  Skin: Skin is warm and dry. She is not diaphoretic.  Nursing note and vitals reviewed.   ED Course  Procedures (including critical care time) Labs Review Labs Reviewed  URINALYSIS, ROUTINE W REFLEX MICROSCOPIC (NOT AT [facility]) - Abnormal; Notable for the following:    Color, Urine AMBER (*)    Leukocytes, UA SMALL (*)    All other components within normal limits  CBC WITH DIFFERENTIAL/PLATELET  COMPREHENSIVE METABOLIC PANEL  LIPASE, BLOOD  URINE MICROSCOPIC-ADD ON  POC URINE PREG, ED    Imaging Review No results found.   EKG Interpretation None      MDM   Final diagnoses:  Pelvic pain affecting pregnancy    Patient returns today with continued pelvic pain. The pelvic examination with negative labs, except for bacterial vaginosis for which she is currently being treated in taking her medications as directed. Her urine today is negative with no abnormal labs. Review of her chart shows AN ABNORMAL ENDOMETRIAL THICKENING, WHICH SUGGESTS A REPEAT ULTRASOUND AFTER HER  PERIOD JUST FINISHED. I do not feel the patient needs repeat CT imaging. I have discussed the case with Dr. Elesa Massed who agrees. Patient will be given Pyridium, pain medication. I have referred the patient to urology to rule out interstitial cystitis. Outpatient follow-up ultrasound to reevaluate the endometrium. I asked the patient to follow up on the findings with her OB/GYN provider.. Continue taking Flagyl. The patient agrees with plan of care. Keep her safe for discharge at this time. Pain addressed and controlled the emergency department    Arthor Captain, PA-C 03/20/15 1735  Layla Maw Ward, DO 03/20/15 2044

## 2015-03-20 NOTE — ED Notes (Signed)
Pt c/o low abdominal pain x 2 months, and emesis onset yesterday, last seen for the same last Saturday.

## 2015-03-20 NOTE — Discharge Instructions (Signed)

## 2015-03-20 NOTE — MAU Provider Note (Signed)
History     CSN: [id number]  Arrival date and time: 03/20/15 1946   First Provider Initiated Contact with Patient 03/20/15 2045      No chief complaint on file.  HPI Comments: Dawn Price is a 29 y.o. G0P0 who presents today with pelvic pain. She states that this has been an ongoing problem for her. [facility] several times for this same problem. She saw Dr. Kathlen BrunswickLipscomb (GYN) on 02/25/15, and had a pap smear. She states that he felt it was "related to her bladder". She has an appointment with him on 04/07/15 for FU. She has not called his office to tell him that the pain is worse. She has had two pelvic US and a CT scan since the pain started.   Abdominal Pain This is a new problem. The current episode started more than 1 month ago. The onset quality is gradual. The problem occurs constantly. The problem has been gradually worsening. The pain is located in the suprapubic region. The pain is at a severity of 8/10. The quality of the pain is sharp. The abdominal pain radiates to the back. Associated symptoms include dysuria, nausea and vomiting. Pertinent negatives include no constipation, diarrhea, fever or frequency. The pain is aggravated by eating, urination and certain positions. Relieved by: laying down, and keeping stomache empty  Treatments tried: she had morphine today, helped for a couple of hour, but pain has returned  Prior diagnostic workup includes CT scan and ultrasound.   Past Medical History  Diagnosis Date  . Anxiety   . Major depressive disorder 06/26/2014    No past surgical history on file.  No family history on file.  History  Substance Use Topics  . Smoking status: Never Smoker   . Smokeless tobacco: Not on file  . Alcohol Use: Yes     Comment: occasionally, pt reports 2 x a week    Allergies:  Allergies  Allergen Reactions  . Almond Oil Rash  . Doxycycline Nausea Only  . Peanuts [Peanut Oil] Rash    Prescriptions prior to  admission  Medication Sig Dispense Refill Last Dose  . amoxicillin-clavulanate (AUGMENTIN) 875-125 MG per tablet Take 1 tablet by mouth every 12 (twelve) hours. 28 tablet 0 03/19/2015 at Unknown time  . metroNIDAZOLE (FLAGYL) 500 MG tablet Take 1 tablet (500 mg total) by mouth 2 (two) times daily. 28 tablet 0 03/19/2015 at Unknown time  . metroNIDAZOLE (FLAGYL) 500 MG tablet Take 1 tablet (500 mg total) by mouth 2 (two) times daily. 14 tablet 0 03/19/2015 at Unknown time  . clindamycin (CLINDAGEL) 1 % gel Apply topically 2 (two) times daily. (Patient not taking: Reported on 01/29/2015) 30 g 3 Completed Course at Unknown time  . doxycycline (VIBRAMYCIN) 100 MG capsule Take 1 capsule (100 mg total) by mouth 2 (two) times daily. 28 capsule 0 02/08/2015 at Unknown time  . FLUoxetine (PROZAC) 20 MG capsule Take 1 capsule (20 mg total) by mouth daily. For depression (Patient not taking: Reported on 01/29/2015) 30 capsule 0 Completed Course at Unknown time  . hydrocodone-ibuprofen (VICOPROFEN) 5-200 MG per tablet Take 1 tablet by mouth every 8 (eight) hours as needed for pain. (Patient not taking: Reported on 03/20/2015) 9 tablet 0 Completed Course at Unknown time  . Lactobacillus (ACIDOPHILUS PROBIOTIC) 10 MG TABS Take 10 mg by mouth 3 (three) times daily. (Patient taking differently: Take 10 mg by mouth daily. ) 42 tablet 0 03/19/2015 at Unknown time  .  LORazepam (ATIVAN) 1 MG tablet Take 1 tablet (1 mg total) by mouth 3 (three) times daily as needed for anxiety. (Patient not taking: Reported on 01/29/2015) 15 tablet 0 Completed Course at Unknown time  . Multiple Vitamin (MULTIVITAMIN WITH MINERALS) TABS tablet Take 1 tablet by mouth daily. May purchase over the counter for vitamin supplementation (Patient not taking: Reported on 03/20/2015)   Not Taking at Unknown time  . naproxen (NAPROSYN) 375 MG tablet Take 1 tablet (375 mg total) by mouth 2 (two) times daily. 20 tablet 0   . norgestimate-ethinyl estradiol  (ORTHO-CYCLEN, 28,) 0.25-35 MG-MCG tablet Take 1 tablet by mouth daily. (Patient not taking: Reported on 11/22/2014) 1 Package 0 Completed Course at Unknown time  . omeprazole (PRILOSEC) 20 MG capsule Take 1 each evening for treatment and prevention of heartburn (Patient not taking: Reported on 01/29/2015) 30 capsule 1 Completed Course at Unknown time  . ondansetron (ZOFRAN ODT) 4 MG disintegrating tablet Take 1 tablet (4 mg total) by mouth every 8 (eight) hours as needed for nausea or vomiting. 10 tablet 0 unknown  . oxyCODONE-acetaminophen (PERCOCET) 5-325 MG per tablet Take 1-2 tablets by mouth every 4 (four) hours as needed for severe pain. 20 tablet 0   . PARoxetine (PAXIL) 20 MG tablet Take 1 tablet (20 mg total) by mouth daily. (Patient not taking: Reported on 01/29/2015) 30 tablet 3 Completed Course at Unknown time  . phenazopyridine (PYRIDIUM) 200 MG tablet Take 1 tablet (200 mg total) by mouth 3 (three) times daily. 6 tablet 0   . polyethylene glycol (MIRALAX / GLYCOLAX) packet Take 17 g by mouth daily as needed for mild constipation.   unknown  . Vitamin D, Ergocalciferol, (DRISDOL) 50000 UNITS CAPS capsule Take 50,000 Units by mouth once a week. Saturday   Past Week at Unknown time    Review of Systems  Constitutional: Positive for chills and malaise/fatigue. Negative for fever.  Gastrointestinal: Positive for nausea, vomiting and abdominal pain. Negative for diarrhea and constipation.  Genitourinary: Positive for dysuria. Negative for urgency and frequency.   Physical Exam   Blood pressure 89/41, pulse 73, temperature 98.6 F (37 C), temperature source Oral, resp. rate 16, height 5' (1.524 m), weight 39.009 kg (86 lb), last menstrual period 01/28/2015, SpO2 100 %.  Physical Exam  Nursing note and vitals reviewed. Constitutional: She is oriented to person, place, and time. She appears well-developed and well-nourished. No distress.  Cardiovascular: Normal rate.   Respiratory: Effort  normal.  GI: Soft. She exhibits no distension. There is no tenderness. There is no rebound.  Neurological: She is alert and oriented to person, place, and time.  Skin: Skin is warm and dry.  Psychiatric: She has a normal mood and affect.   Results for orders placed or performed during the hospital encounter of 03/20/15 (from the past 24 hour(s))  Urinalysis, Routine w reflex microscopic     Status: Abnormal   Collection Time: 03/20/15 12:22 PM  Result Value Ref Range   Color, Urine AMBER (A) YELLOW   APPearance CLEAR CLEAR   Specific Gravity, Urine 1.024 1.005 - 1.030   pH 5.0 5.0 - 8.0   Glucose, UA NEGATIVE NEGATIVE mg/dL   Hgb urine dipstick NEGATIVE NEGATIVE   Bilirubin Urine NEGATIVE NEGATIVE   Ketones, ur NEGATIVE NEGATIVE mg/dL   Protein, ur NEGATIVE NEGATIVE mg/dL   Urobilinogen, UA 0.2 0.0 - 1.0 mg/dL   Nitrite NEGATIVE NEGATIVE   Leukocytes, UA SMALL (A) NEGATIVE  Urine microscopic-add on  Status: None   Collection Time: 03/20/15 12:22 PM  Result Value Ref Range   WBC, UA 0-2 <3 WBC/hpf  CBC with Differential     Status: None   Collection Time: 03/20/15 12:47 PM  Result Value Ref Range   WBC 7.4 4.0 - 10.5 K/uL   RBC 4.26 3.87 - 5.11 MIL/uL   Hemoglobin 12.9 12.0 - 15.0 g/dL   HCT 16.1 09.6 - 04.5 %   MCV 89.7 78.0 - 100.0 fL   MCH 30.3 26.0 - 34.0 pg   MCHC 33.8 30.0 - 36.0 g/dL   RDW 40.9 81.1 - 91.4 %   Platelets 252 150 - 400 K/uL   Neutrophils Relative % 63 43 - 77 %   Neutro Abs 4.7 1.7 - 7.7 K/uL   Lymphocytes Relative 28 12 - 46 %   Lymphs Abs 2.1 0.7 - 4.0 K/uL   Monocytes Relative 8 3 - 12 %   Monocytes Absolute 0.6 0.1 - 1.0 K/uL   Eosinophils Relative 1 0 - 5 %   Eosinophils Absolute 0.1 0.0 - 0.7 K/uL   Basophils Relative 0 0 - 1 %   Basophils Absolute 0.0 0.0 - 0.1 K/uL  Comprehensive metabolic panel     Status: None   Collection Time: 03/20/15 12:47 PM  Result Value Ref Range   Sodium 140 135 - 145 mmol/L   Potassium 4.0 3.5 - 5.1 mmol/L    Chloride 108 101 - 111 mmol/L   CO2 23 22 - 32 mmol/L   Glucose, Bld 79 65 - 99 mg/dL   BUN 13 6 - 20 mg/dL   Creatinine, Ser 7.82 0.44 - 1.00 mg/dL   Calcium 9.4 8.9 - 95.6 mg/dL   Total Protein 7.1 6.5 - 8.1 g/dL   Albumin 4.3 3.5 - 5.0 g/dL   AST 28 15 - 41 U/L   ALT 41 14 - 54 U/L   Alkaline Phosphatase 58 38 - 126 U/L   Total Bilirubin 1.0 0.3 - 1.2 mg/dL   GFR calc non Af Amer >60 >60 mL/min   GFR calc Af Amer >60 >60 mL/min   Anion gap 9 5 - 15  Lipase, blood     Status: None   Collection Time: 03/20/15 12:47 PM  Result Value Ref Range   Lipase 27 22 - 51 U/L  POC Urine Pregnancy, ED  (If Pre-menopausal female)  not at [facility]     Status: None   Collection Time: 03/20/15  2:20 PM  Result Value Ref Range   Preg Test, Ur NEGATIVE NEGATIVE   US Transvaginal Non-ob  03/20/2015   CLINICAL DATA:  Pelvic pain at times. Symptoms for couple of months. History of PID and thickened endometrium. LMP 01/28/2015  EXAM: ULTRASOUND PELVIS TRANSVAGINAL  TECHNIQUE: Transvaginal ultrasound examination of the pelvis was performed including evaluation of the uterus, ovaries, adnexal regions, and pelvic cul-de-sac.  COMPARISON:  CT of the abdomen and pelvis on 01/29/2015  FINDINGS: Uterus  Measurements: 6.2 x 4.3 x 6.1 cm. Retroflexed.  No focal mass.  Endometrium  Thickness: 21 mm.  No focal abnormality visualized.  Right ovary  Measurements: 2.2 x 1.8 x 1.4 cm. Normal appearance/no adnexal mass.  Left ovary  Measurements: 2.7 x 1.4 x 2.0 cm. Normal appearance/no adnexal mass.  Other findings:  No free fluid  IMPRESSION: 1. Retroflexed uterus without focal mass. 2. Endometrium continues to appeared thickened without focal mass evident. Endometrium cannot be fully evaluated given the phase of the menstrual  cycle. Recommend follow-up by [place] in 6-8 weeks, during the week immediately following menses (exam timing is critical). 3. Normal appearance of the ovaries.   Electronically Signed   By: Norva Pavlov  M.D.   On: 03/20/2015 21:37    MAU Course  Procedures  MDM   Assessment and Plan   1. Pelvic pain in female    DC home Pain is possibly from interstitial cystitis v pelvic pathology (endometriosis?) Patient has FU with GYN scheduled and referral to urology Encouraged patient to keep those appointments or call their offices if pain continues for evaluation in the office Return to MAU as needed  Follow-up Information    Call Rush Farmer, MD.   Specialty:  Obstetrics and Gynecology   Contact information:   [address] [place] [address] [phone]        Tawnya Crook 03/20/2015, 8:53 PM

## 2015-03-20 NOTE — Discharge Instructions (Signed)
Dr. Waynard ReedsKendra Ross MD [address] [place] [id number] [phone]   Pelvic Pain Female pelvic pain can be caused by many different things and start from a variety of places. Pelvic pain refers to pain that is located in the lower half of the abdomen and between your hips. The pain may occur over a short period of time (acute) or may be reoccurring (chronic). The cause of pelvic pain may be related to disorders affecting the female reproductive organs (gynecologic), but it may also be related to the bladder, kidney stones, an intestinal complication, or muscle or skeletal problems. Getting help right away for pelvic pain is important, especially if there has been severe, sharp, or a sudden onset of unusual pain. It is also important to get help right away because some types of pelvic pain can be life threatening.  CAUSES  Below are only some of the causes of pelvic pain. The causes of pelvic pain can be in one of several categories.   Gynecologic.  Pelvic inflammatory disease.  Sexually transmitted infection.  Ovarian cyst or a twisted ovarian ligament (ovarian torsion).  Uterine lining that grows outside the uterus (endometriosis).  Fibroids, cysts, or tumors.  Ovulation.  Pregnancy.  Pregnancy that occurs outside the uterus (ectopic pregnancy).  Miscarriage.  Labor.  Abruption of the placenta or ruptured uterus.  Infection.  Uterine infection (endometritis).  Bladder infection.  Diverticulitis.  Miscarriage related to a uterine infection (septic abortion).  Bladder.  Inflammation of the bladder (cystitis).  Kidney stone(s).  Gastrointestinal.  Constipation.  Diverticulitis.  Neurologic.  Trauma.  Feeling pelvic pain because of mental or emotional causes (psychosomatic).  Cancers of the bowel or pelvis. EVALUATION  Your caregiver will want to take a careful history of your concerns. This includes recent changes in your health, a  careful gynecologic history of your periods (menses), and a sexual history. Obtaining your family history and medical history is also important. Your caregiver may suggest a pelvic exam. A pelvic exam will help identify the location and severity of the pain. It also helps in the evaluation of which organ system may be involved. In order to identify the cause of the pelvic pain and be properly treated, your caregiver may order tests. These tests may include:   A pregnancy test.  Pelvic ultrasonography.  An X-ray exam of the abdomen.  A urinalysis or evaluation of vaginal discharge.  Blood tests. HOME CARE INSTRUCTIONS   Only take over-the-counter or prescription medicines for pain, discomfort, or fever as directed by your caregiver.   Rest as directed by your caregiver.   Eat a balanced diet.   Drink enough fluids to make your urine clear or pale yellow, or as directed.   Avoid sexual intercourse if it causes pain.   Apply warm or cold compresses to the lower abdomen depending on which one helps the pain.   Avoid stressful situations.   Keep a journal of your pelvic pain. Write down when it started, where the pain is located, and if there are things that seem to be associated with the pain, such as food or your menstrual cycle.  Follow up with your caregiver as directed.  SEEK MEDICAL CARE IF:  Your medicine does not help your pain.  You have abnormal vaginal discharge. SEEK IMMEDIATE MEDICAL CARE IF:   You have heavy bleeding from the vagina.   Your pelvic pain increases.   You feel light-headed or faint.   You have chills.  You have pain with urination or blood in your urine.   You have uncontrolled diarrhea or vomiting.   You have a fever or persistent symptoms for more than 3 days.  You have a fever and your symptoms suddenly get worse.   You are being physically or sexually abused.  MAKE SURE YOU:  Understand these instructions.  Will  watch your condition.  Will get help if you are not doing well or get worse. Document Released: 09/07/2004 Document Revised: 02/25/2014 Document Reviewed: 01/31/2012 [facility] Patient Information 2015 [place], [place]. This information is not intended to replace advice given to you by your health care provider. Make sure you discuss any questions you have with your health care provider. Interstitial Cystitis Interstitial cystitis (IC) is a condition that results in discomfort or pain in the bladder and the surrounding pelvic region. The symptoms can be different from case to case and even in the same individual. People may experience:  Mild discomfort.  Pressure.  Tenderness.  Intense pain in the bladder and pelvic area. CAUSES  Because IC varies so much in symptoms and severity, people studying this disease believe it is not one but several diseases. Some caregivers use the term painful bladder syndrome (PBS) to describe cases with painful urinary symptoms. This may not meet the strictest definition of IC. The term IC / PBS includes all cases of urinary pain that cannot be connected to other causes, such as infection or urinary stones.  SYMPTOMS  Symptoms may include:  An urgent need to urinate.  A frequent need to urinate.  A combination of these symptoms. Pain may change in intensity as the bladder fills with urine or as it empties. Women's symptoms often get worse during menstruation. They may sometimes experience pain with vaginal intercourse. Some of the symptoms of IC / PBS seem like those of bacterial infection. Tests do not show infection. IC / PBS is far more common in women than in men.  DIAGNOSIS  The diagnosis of IC / PBS is based on:  Presence of pain related to the bladder, usually along with problems of frequency and urgency.  Not finding other diseases that could cause the symptoms.  Diagnostic tests that help rule out other diseases include:  Urinalysis.  Urine  culture.  Cystoscopy.  Biopsy of the bladder wall.  Distension of the bladder under anesthesia.  Urine cytology.  Laboratory examination of prostate secretions. A biopsy is a tissue sample that can be looked at under a microscope. Samples of the bladder and urethra may be removed during a cystoscopy. A biopsy helps rule out bladder cancer. TREATMENT  Scientists have not yet found a cure for IC / PBS. Patients with IC / PBS do not get better with antibiotic therapy. Caregivers cannot predict who will respond best to which treatment. Symptoms may disappear without explanation. Disappearing symptoms may coincide with an event such as a change in diet or treatment. Even when symptoms disappear, they may return after days, weeks, months, or years.  Because the causes of IC / PBS are unknown, current treatments are aimed at relieving symptoms. Many people are helped by one or a combination of the treatments. As researchers learn more about IC / PBS, the list of potential treatments will change. Patients should discuss their options with a caregiver. SURGERY  Surgery should be considered only if all available treatments have failed and the pain is disabling. Many approaches and techniques are used. Each approach has its own advantages and complications. Advantages  and complications should be discussed with a urologist. Your caregiver may recommend consulting another urologist for a second opinion. Most caregivers are reluctant to operate because the outcome is unpredictable. Some people still have symptoms after surgery.  People considering surgery should discuss the potential risks and benefits, side effects, and long- and short-term complications with their family, as well as with people who have already had the procedure. Surgery requires anesthesia, hospitalization, and in some cases weeks or months of recovery. As the complexity of the procedure increases, so do the chances for complications and for  failure. HOME CARE INSTRUCTIONS   All drugs, even those sold over the counter, have side effects. Patients should always consult a caregiver before using any drug for an extended amount of time. Only take over-the-counter or prescription medicines for pain, discomfort, or fever as directed by your caregiver.  Many patients feel that smoking makes their symptoms worse. How the by-products of tobacco that are excreted in the urine affect IC / PBS is unknown. Smoking is the major known cause of bladder cancer. One of the best things smokers can do for their bladder and their overall health is to quit.  Many patients feel that gentle stretching exercises help relieve IC / PBS symptoms.  Methods vary, but basically patients decide to empty their bladder at designated times and use relaxation techniques and distractions to keep to the schedule. Gradually, patients try to lengthen the time between scheduled voids. A diary in which to record voiding times is usually helpful in keeping track of progress. MAKE SURE YOU:   Understand these instructions.  Will watch your condition.  Will get help right away if you are not doing well or get worse. Document Released: 06/11/2004 Document Revised: 01/03/2012 Document Reviewed: 08/26/2008 [facility] Patient Information 2015 [place], [place]. This information is not intended to replace advice given to you by your health care provider. Make sure you discuss any questions you have with your health care provider. Endometriosis Endometriosis is a condition in which the tissue that lines the uterus (endometrium) grows outside of its normal location. The tissue may grow in many locations close to the uterus, but it commonly grows on the ovaries, fallopian tubes, vagina, or bowel. Because the uterus expels, or sheds, its lining every menstrual cycle, there is bleeding wherever the endometrial tissue is located. This can cause pain because blood is irritating to tissues not  normally exposed to it.  CAUSES  The cause of endometriosis is not known.  SIGNS AND SYMPTOMS  Often, there are no symptoms. When symptoms are present, they can vary with the location of the displaced tissue. Various symptoms can occur at different times. Although symptoms occur mainly during a woman's menstrual period, they can also occur midcycle and usually stop with menopause. Some people may go months with no symptoms at all. Symptoms may include:   Back or abdominal pain.   Heavier bleeding during periods.   Pain during intercourse.   Painful bowel movements.   Infertility. DIAGNOSIS  Your health care provider will do a physical exam and ask about your symptoms. Various tests may be done, such as:   Blood tests and urine tests. These are done to help rule out other problems.   Ultrasound. This test is done to look for abnormal tissue.   An X-ray of the lower bowel (barium enema).  Laparoscopy. In this procedure, a thin, lighted tube with a tiny camera on the end (laparoscope) is inserted into your abdomen. This helps  your health care provider look for abnormal tissue to confirm the diagnosis. The health care provider may also remove a small piece of tissue (biopsy) from any abnormal tissue found. This tissue sample can then be sent to a lab so it can be looked at under a microscope. TREATMENT  Treatment will vary and may include:   Medicines to relieve pain. Nonsteroidal anti-inflammatory drugs (NSAIDs) are a type of pain medicine that can help to relieve the pain caused by endometriosis.  Hormonal therapy. When using hormonal therapy, periods are eliminated. This eliminates the monthly exposure to blood by the displaced endometrial tissue.   Surgery. Surgery may sometimes be done to remove the abnormal endometrial tissue. In severe cases, surgery may be done to remove the fallopian tubes, uterus, and ovaries (hysterectomy). HOME CARE INSTRUCTIONS   Take all  medicines as directed by your health care provider. Do not take aspirin because it may increase bleeding when you are not on hormonal therapy.   Avoid activities that produce pain, including sexual activity. SEEK MEDICAL CARE IF:  You have pelvic pain before, after, or during your periods.  You have pelvic pain between periods that gets worse during your period.  You have pelvic pain during or after sex.  You have pelvic pain with bowel movements or urination, especially during your period.  You have problems getting pregnant.  You have a fever. SEEK IMMEDIATE MEDICAL CARE IF:   Your pain is severe and is not responding to pain medicine.   You have severe nausea and vomiting, or you cannot keep foods down.   You have pain that is limited to the right lower part of your abdomen.   You have swelling or increasing pain in your abdomen.   You see blood in your stool.  MAKE SURE YOU:   Understand these instructions.  Will watch your condition.  Will get help right away if you are not doing well or get worse. Document Released: 10/08/2000 Document Revised: 02/25/2014 Document Reviewed: 06/08/2013 [facility] Patient Information 2015 [place], [place]. This information is not intended to replace advice given to you by your health care provider. Make sure you discuss any questions you have with your health care provider.

## 2015-03-20 NOTE — MAU Note (Signed)
Pt. States she has been to the ER 4 times in the past few months. Was diagnosed with PID and is currently on Flagyl and Augmentin. Did not take these since yesterday. Pain increased today and [facility]. Was given medication Rx for pain management and pt. Came here because pain is unbearable. Pt. States she has a follow up with a gynecologist but is not sure of who that doctor is. Family at bedside.

## 2015-03-23 ENCOUNTER — Encounter (HOSPITAL_COMMUNITY): Payer: Self-pay | Admitting: Emergency Medicine

## 2015-03-23 ENCOUNTER — Emergency Department (HOSPITAL_COMMUNITY)
Admission: EM | Admit: 2015-03-23 | Discharge: 2015-03-23 | Disposition: A | Discharge status: Home / Self Care | Payer: BLUE CROSS/BLUE SHIELD | Attending: Emergency Medicine | Admitting: Emergency Medicine

## 2015-03-23 DIAGNOSIS — F419 Anxiety disorder, unspecified: Secondary | ICD-10-CM | POA: Insufficient documentation

## 2015-03-23 DIAGNOSIS — Z79899 Other long term (current) drug therapy: Secondary | ICD-10-CM | POA: Insufficient documentation

## 2015-03-23 DIAGNOSIS — Z8639 Personal history of other endocrine, nutritional and metabolic disease: Secondary | ICD-10-CM | POA: Insufficient documentation

## 2015-03-23 DIAGNOSIS — Z792 Long term (current) use of antibiotics: Secondary | ICD-10-CM | POA: Insufficient documentation

## 2015-03-23 DIAGNOSIS — R102 Pelvic and perineal pain: Secondary | ICD-10-CM

## 2015-03-23 DIAGNOSIS — R1032 Left lower quadrant pain: Secondary | ICD-10-CM | POA: Diagnosis present

## 2015-03-23 DIAGNOSIS — Z791 Long term (current) use of non-steroidal anti-inflammatories (NSAID): Secondary | ICD-10-CM | POA: Diagnosis not present

## 2015-03-23 DIAGNOSIS — F329 Major depressive disorder, single episode, unspecified: Secondary | ICD-10-CM | POA: Insufficient documentation

## 2015-03-23 DIAGNOSIS — N390 Urinary tract infection, site not specified: Secondary | ICD-10-CM | POA: Diagnosis not present

## 2015-03-23 LAB — URINALYSIS, ROUTINE W REFLEX MICROSCOPIC
Glucose, UA: NEGATIVE mg/dL
Hgb urine dipstick: NEGATIVE
Ketones, ur: 15 mg/dL — AB
NITRITE: POSITIVE — AB
PH: 5 (ref 5.0–8.0)
Protein, ur: NEGATIVE mg/dL
Specific Gravity, Urine: 1.017 (ref 1.005–1.030)
Urobilinogen, UA: 4 mg/dL — ABNORMAL HIGH (ref 0.0–1.0)

## 2015-03-23 LAB — URINE MICROSCOPIC-ADD ON

## 2015-03-23 MED ORDER — MELOXICAM 7.5 MG PO TABS: 7.5000 mg | ORAL_TABLET | Freq: Two times a day (BID) | ORAL | Status: DC

## 2015-03-23 MED ORDER — SULFAMETHOXAZOLE-TRIMETHOPRIM 800-160 MG PO TABS: 1.0000 | ORAL_TABLET | Freq: Two times a day (BID) | ORAL | Status: DC

## 2015-03-23 MED ORDER — PHENAZOPYRIDINE HCL 200 MG PO TABS: 200.0000 mg | ORAL_TABLET | Freq: Three times a day (TID) | ORAL | Status: DC

## 2015-03-23 MED ORDER — SULFAMETHOXAZOLE-TRIMETHOPRIM 800-160 MG PO TABS
1.0000 | ORAL_TABLET | Freq: Once | ORAL | Status: AC
  Administered 2015-03-23: 1 via ORAL
  Filled 2015-03-23: qty 1

## 2015-03-23 MED ORDER — KETOROLAC TROMETHAMINE 30 MG/ML IJ SOLN
30.0000 mg | Freq: Once | INTRAMUSCULAR | Status: AC
  Administered 2015-03-23: 30 mg via INTRAMUSCULAR
  Filled 2015-03-23: qty 1

## 2015-03-23 MED ORDER — PHENAZOPYRIDINE HCL 100 MG PO TABS
100.0000 mg | ORAL_TABLET | Freq: Three times a day (TID) | ORAL | Status: DC
  Filled 2015-03-23: qty 1

## 2015-03-23 NOTE — ED Notes (Signed)
Pt ambulated to restroom with steady gait.

## 2015-03-23 NOTE — Discharge Instructions (Signed)
Please call the urologist tell them you are being referred through the emergency department.  You have been given a prescription for aerobic which is a potent anti-inflammatory please take this on a regular basis and use the narcotic as needed for more severe pain. As discussed it is very important that you eat and drink small amounts frequently. Today your urine shows that you have a urinary tract infection despite the use of the antibiotics that you're being treated for PID with human given an additional antibiotics to take please take this with a large glass of water twice a day until all tablets have been consumed.

## 2015-03-23 NOTE — ED Provider Notes (Signed)
CSN: [id number]     Arrival date & time 03/23/15  4098 History   First MD Initiated Contact with Patient 03/23/15 0404     Chief Complaint  Patient presents with  . Abdominal Pain     (Consider location/radiation/quality/duration/timing/severity/associated sxs/prior Treatment) HPI Comments: This is a 48 G visit in 2 months for lower abdominal pain patient is currently taking antibiotics for presumed PID she still has 1 day left of antibiotics she states for the past 2 days she's had a change in her pain she's had some abdominal bloating and distention she states her appetite has been decreased she has lost 10 pounds and this two-month period her energy level has decreased greatly. Per her last discharge instructions she is to follow-up with urology as the OB/GYN specialist feels that this is an interstitial cystitis patient states that she has not had time to make an appointment yet. She also reports that the most effective treatment thus far has been IM Toradol  Patient is a 29 y.o. female presenting with abdominal pain. The history is provided by the patient.  Abdominal Pain Pain location:  Suprapubic, LLQ and RLQ Pain quality: bloating and fullness   Pain radiates to:  Does not radiate Pain severity:  Moderate Onset quality:  Unable to specify Timing:  Constant Progression:  Worsening Chronicity:  Recurrent Context: not laxative use and not sick contacts   Relieved by:  Nothing Worsened by:  Nothing tried Ineffective treatments:  None tried Associated symptoms: no constipation, no diarrhea, no dysuria, no fever, no shortness of breath, no vaginal bleeding and no vaginal discharge   Risk factors: not pregnant     Past Medical History  Diagnosis Date  . Anxiety   . Major depressive disorder 06/26/2014  . Vitamin D deficiency    History reviewed. No pertinent past surgical history. No family history on file. History  Substance Use Topics  . Smoking status: Never Smoker   .  Smokeless tobacco: Not on file  . Alcohol Use: Yes     Comment: occasionally, pt reports 2 x a week   OB History    No data available     Review of Systems  Constitutional: Negative for fever.  Respiratory: Negative for shortness of breath.   Gastrointestinal: Positive for abdominal pain. Negative for diarrhea and constipation.  Genitourinary: Positive for pelvic pain. Negative for dysuria, vaginal bleeding, vaginal discharge and vaginal pain.  Skin: Negative for rash.  Psychiatric/Behavioral: The patient is nervous/anxious.   All other systems reviewed and are negative.     Allergies  Almond oil; Doxycycline; and Peanuts  Home Medications   Prior to Admission medications   Medication Sig Start Date End Date Taking? Authorizing Provider  amoxicillin-clavulanate (AUGMENTIN) 875-125 MG per tablet Take 1 tablet by mouth every 12 (twelve) hours. 03/16/15  Yes Everlene Farrier, PA-C  Lactobacillus (ACIDOPHILUS PROBIOTIC) 10 MG TABS Take 10 mg by mouth 3 (three) times daily. Patient taking differently: Take 10 mg by mouth daily.  03/16/15  Yes Everlene Farrier, PA-C  metroNIDAZOLE (FLAGYL) 500 MG tablet Take 1 tablet (500 mg total) by mouth 2 (two) times daily. 03/16/15  Yes Everlene Farrier, PA-C  naproxen (NAPROSYN) 375 MG tablet Take 1 tablet (375 mg total) by mouth 2 (two) times daily. 03/20/15  Yes Arthor Captain, PA-C  ondansetron (ZOFRAN ODT) 4 MG disintegrating tablet Take 1 tablet (4 mg total) by mouth every 8 (eight) hours as needed for nausea or vomiting. 03/16/15  Yes Everlene Farrier, PA-C  oxyCODONE-acetaminophen (PERCOCET) 5-325 MG per tablet Take 1-2 tablets by mouth every 4 (four) hours as needed for severe pain. 03/20/15  Yes Arthor CaptainAbigail Harris, PA-C  clindamycin (CLINDAGEL) 1 % gel Apply topically 2 (two) times daily. Patient not taking: Reported on 01/29/2015 11/22/14   Peyton Najjaravid H Hopper, MD  doxycycline (VIBRAMYCIN) 100 MG capsule Take 1 capsule (100 mg total) by mouth 2 (two) times  daily. Patient not taking: Reported on 03/23/2015 01/29/15   Eyvonne MechanicJeffrey Hedges, PA-C  FLUoxetine (PROZAC) 20 MG capsule Take 1 capsule (20 mg total) by mouth daily. For depression Patient not taking: Reported on 01/29/2015 06/26/14   Thermon LeylandLaura A Davis, NP  hydrocodone-ibuprofen (VICOPROFEN) 5-200 MG per tablet Take 1 tablet by mouth every 8 (eight) hours as needed for pain. Patient not taking: Reported on 03/20/2015 01/29/15   Eyvonne MechanicJeffrey Hedges, PA-C  LORazepam (ATIVAN) 1 MG tablet Take 1 tablet (1 mg total) by mouth 3 (three) times daily as needed for anxiety. Patient not taking: Reported on 01/29/2015 11/01/14   Emilia BeckKaitlyn Szekalski, PA-C  meloxicam (MOBIC) 7.5 MG tablet Take 1 tablet (7.5 mg total) by mouth 2 (two) times daily. 03/23/15   Earley FavorGail Kimmora Risenhoover, NP  metroNIDAZOLE (FLAGYL) 500 MG tablet Take 1 tablet (500 mg total) by mouth 2 (two) times daily. Patient not taking: Reported on 03/23/2015 01/29/15   Eyvonne MechanicJeffrey Hedges, PA-C  Multiple Vitamin (MULTIVITAMIN WITH MINERALS) TABS tablet Take 1 tablet by mouth daily. May purchase over the counter for vitamin supplementation Patient not taking: Reported on 03/20/2015 06/26/14   Thermon LeylandLaura A Davis, NP  norgestimate-ethinyl estradiol (ORTHO-CYCLEN, 28,) 0.25-35 MG-MCG tablet Take 1 tablet by mouth daily. Patient not taking: Reported on 11/22/2014 11/12/14   Mancel BaleElliott Wentz, MD  omeprazole (PRILOSEC) 20 MG capsule Take 1 each evening for treatment and prevention of heartburn Patient not taking: Reported on 01/29/2015 11/22/14   Peyton Najjaravid H Hopper, MD  PARoxetine (PAXIL) 20 MG tablet Take 1 tablet (20 mg total) by mouth daily. Patient not taking: Reported on 01/29/2015 11/22/14   Peyton Najjaravid H Hopper, MD  phenazopyridine (PYRIDIUM) 200 MG tablet Take 1 tablet (200 mg total) by mouth 3 (three) times daily. 03/23/15   Earley FavorGail Markeith Jue, NP  sulfamethoxazole-trimethoprim (BACTRIM DS,SEPTRA DS) 800-160 MG per tablet Take 1 tablet by mouth 2 (two) times daily. 03/23/15 03/30/15  Earley FavorGail Bryce Cheever, NP   BP 110/61 mmHg  Pulse 69   Temp(Src) 97.9 F (36.6 C) (Oral)  Resp 18  Ht 5' (1.524 m)  Wt 87 lb (39.463 kg)  BMI 16.99 kg/m2  SpO2 100%  LMP 01/28/2015 Physical Exam  Constitutional: She is oriented to person, place, and time. She appears well-developed and well-nourished. She appears distressed.  Eyes: Pupils are equal, round, and reactive to light.  Neck: Normal range of motion.  Cardiovascular: Normal rate and regular rhythm.   Pulmonary/Chest: Effort normal.  Abdominal: Soft. There is tenderness in the right lower quadrant, suprapubic area and left lower quadrant.  Musculoskeletal: Normal range of motion.  Neurological: She is alert and oriented to person, place, and time.  Skin: Skin is warm and dry. No rash noted.  Psychiatric: Her mood appears anxious.  Nursing note and vitals reviewed.   ED Course  Procedures (including critical care time) Labs Review Labs Reviewed  URINALYSIS, ROUTINE W REFLEX MICROSCOPIC (NOT AT [facility]) - Abnormal; Notable for the following:    Color, Urine RED (*)    APPearance CLOUDY (*)    Bilirubin Urine SMALL (*)    Ketones, ur 15 (*)  Urobilinogen, UA 4.0 (*)    Nitrite POSITIVE (*)    Leukocytes, UA MODERATE (*)    All other components within normal limits  URINE MICROSCOPIC-ADD ON - Abnormal; Notable for the following:    Bacteria, UA FEW (*)    All other components within normal limits  URINE CULTURE    Imaging Review No results found.   EKG Interpretation None     Patient has been encouraged follow-up with urology for continued studies to rule out interstitial cystitis she's been given an injection of Toradol which she states has helped her pain the most she will be given a prescription for movement 7.5 mg twice a day until she can be seen by the urologist MDM   Final diagnoses:  Pelvic pain in female  UTI (lower urinary tract infection)         Earley Favor, NP 03/23/15 0438  Earley Favor, NP 03/23/15 0533  Paula Libra, MD 03/23/15 4098

## 2015-03-23 NOTE — ED Notes (Signed)
Pt aware of the need for a urine sample. Will ambulate after pharmacy talks to her.

## 2015-03-23 NOTE — ED Notes (Signed)
NP Gail at bedside 

## 2015-03-23 NOTE — ED Notes (Addendum)
Pt c/o low mid abd pain x 2 months, this is 5th visit to ED's. Pt was d/c Thursday last week. U/S neg, all other test normal per boyfriend. Boyfriend states she now has distention and guarding that was not present this am. +n/v. Pt is currently taking antibiotics

## 2015-03-25 ENCOUNTER — Ambulatory Visit (INDEPENDENT_AMBULATORY_CARE_PROVIDER_SITE_OTHER): Payer: BLUE CROSS/BLUE SHIELD | Admitting: Gastroenterology

## 2015-03-25 ENCOUNTER — Encounter: Payer: Self-pay | Admitting: Gastroenterology

## 2015-03-25 ENCOUNTER — Other Ambulatory Visit: Payer: Self-pay

## 2015-03-25 VITALS — BP 90/42 | HR 76 | Ht 59.25 in | Wt 85.2 lb

## 2015-03-25 DIAGNOSIS — R1032 Left lower quadrant pain: Secondary | ICD-10-CM

## 2015-03-25 LAB — URINE CULTURE
Colony Count: NO GROWTH
Culture: NO GROWTH
Special Requests: NORMAL

## 2015-03-25 NOTE — Progress Notes (Signed)
HPI: This is a   very pleasant 29 year old woman whom I'm meeting for the first time today  Chief complaint is lower abdominal pain  Has been to ED 5 times in past 2 months.  Lower right sided abd pain; ovarian cyst.  Since then has had left sided abd pains.  Eating or drinking makes it worse.  Bloated and fatigued.  Feels like she's lost weight 10 pouds.  No overt bleeding. Solid stools, but increased frequency.  Has been assumed to have PID however gyne office appointment feels she does   Even water.  Causes heavy bloating, swollen in lower abdomen.  Lower abd pain are needle like sharpnes and it spread to her legs.  Cramping like; poking with a needed type pains.  Has not seen a urologist yet.    CT scan 01/2015 with IV contrast: 1. 2.0 by 1.4 cm follicle or simple cyst of the right ovary uncomplicated, similar appearance on ultrasound. No free pelvic fluid or complexity of the structure to suggest that this is asource of symptoms.2. Appendix unremarkable.3. Thickened endometrium. Please see prior ultrasound report forfollowup suggestion.4. Prominent stool throughout the colon favors constipation. Labs 02/2015 cbc, cmet, lipase all normal. [place] 02/2015 1. Retroflexed uterus without focal mass.2. Endometrium continues to appeared thickened without focal mass evident. Endometrium cannot be fully evaluated given the phase ofthe menstrual cycle. Recommend follow-up by [place] in 6-8 weeks, during the week immediately following menses (exam timing is critical).3. Normal appearance of the ovaries.   Review of systems: Pertinent positive and negative review of systems were noted in the above HPI section. Complete review of systems was performed and was otherwise normal.   Past Medical History  Diagnosis Date  . Anxiety   . Major depressive disorder 06/26/2014  . Vitamin D deficiency   . PID (acute pelvic inflammatory disease)   . UTI (urinary tract infection)   . Hemorrhoids     Past Surgical  History  Procedure Laterality Date  . No past surgeries      Current Outpatient Prescriptions  Medication Sig Dispense Refill  . hydrocodone-ibuprofen (VICOPROFEN) 5-200 MG per tablet Take 1 tablet by mouth every 8 (eight) hours as needed for pain. 9 tablet 0  . Lactobacillus (ACIDOPHILUS PROBIOTIC) 10 MG TABS Take 10 mg by mouth 3 (three) times daily. (Patient taking differently: Take 10 mg by mouth daily. ) 42 tablet 0  . meloxicam (MOBIC) 7.5 MG tablet Take 1 tablet (7.5 mg total) by mouth 2 (two) times daily. (Patient taking differently: Take 7.5 mg by mouth as needed. ) 60 tablet 0  . naproxen (NAPROSYN) 375 MG tablet Take 1 tablet (375 mg total) by mouth 2 (two) times daily. 20 tablet 0  . phenazopyridine (PYRIDIUM) 200 MG tablet Take 1 tablet (200 mg total) by mouth 3 (three) times daily. 6 tablet 0   No current facility-administered medications for this visit.    Allergies as of 03/25/2015 - Review Complete 03/25/2015  Allergen Reaction Noted  . Almond oil Rash 01/29/2015  . Doxycycline Nausea Only 03/15/2015  . Peanuts [peanut oil] Rash 01/29/2015    Family History  Problem Relation Age of Onset  . Colon cancer Neg Hx   . Colon polyps Neg Hx   . Diabetes Neg Hx   . Kidney disease Maternal Aunt   . Esophageal cancer Neg Hx   . Heart disease Maternal Aunt   . Gallbladder disease Maternal Aunt     History   Social History  .  Marital Status: Single    Spouse Name: N/A  . Number of Children: 0  . Years of Education: N/A   Occupational History  . Doctor-PHD [employer]    Social History Main Topics  . Smoking status: Never Smoker   . Smokeless tobacco: Never Used  . Alcohol Use: 0.0 oz/week    0 Standard drinks or equivalent per week     Comment: occasionally, pt reports 2 x a week  . Drug Use: No  . Sexual Activity: Yes   Other Topics Concern  . Not on file   Social History Narrative     Physical Exam: Ht 4' 11.25" (1.505 m)  Wt 85 lb 4 oz (38.669  kg)  BMI 17.07 kg/m2  LMP 01/28/2015 Constitutional: generally well-appearing Psychiatric: alert and oriented x3 Eyes: extraocular movements intact Mouth: oral pharynx moist, no lesions Neck: supple no lymphadenopathy Cardiovascular: heart regular rate and rhythm Lungs: clear to auscultation bilaterally Abdomen: soft, nontender, nondistended, no obvious ascites, no peritoneal signs, normal bowel sounds Extremities: no lower extremity edema bilaterally Skin: no lesions on visible extremities   Assessment and plan: 29 y.o. female with  pelvic pain  Her pain is quite low in her pelvis however she is very clear that she does have somewhat of a postprandial component to it. Eating anything including drinking water can cause this crampy pain to set in. It is certainly an unusual location for upper GI pathology but given the postprandial nature of her discomforts I would like to at least take a look with endoscopy check her for H. pylori. I will also plan to do biopsies for celiac sprue and she will have a celiac panel sent as well.   Rob Buntinganiel Jacobs, MD [facility] 03/25/2015, 2:05 PM  Cc: No ref. provider found

## 2015-03-25 NOTE — Patient Instructions (Addendum)
You will have labs checked today in the basement lab.  Please head down after you check out with the front desk  (celiac sprue panel). You will be set up for an upper endoscopy (with biopsies).

## 2015-03-26 ENCOUNTER — Ambulatory Visit (AMBULATORY_SURGERY_CENTER): Payer: BLUE CROSS/BLUE SHIELD | Admitting: Gastroenterology

## 2015-03-26 ENCOUNTER — Encounter: Payer: Self-pay | Admitting: Gastroenterology

## 2015-03-26 VITALS — BP 98/61 | HR 81 | Temp 98.4°F | Resp 20 | Ht 59.25 in | Wt 85.0 lb

## 2015-03-26 DIAGNOSIS — K2970 Gastritis, unspecified, without bleeding: Secondary | ICD-10-CM

## 2015-03-26 DIAGNOSIS — R1032 Left lower quadrant pain: Secondary | ICD-10-CM

## 2015-03-26 DIAGNOSIS — K2990 Gastroduodenitis, unspecified, without bleeding: Secondary | ICD-10-CM | POA: Diagnosis not present

## 2015-03-26 DIAGNOSIS — K2950 Unspecified chronic gastritis without bleeding: Secondary | ICD-10-CM | POA: Diagnosis not present

## 2015-03-26 HISTORY — PX: ESOPHAGOGASTRODUODENOSCOPY: SHX1529

## 2015-03-26 LAB — CELIAC PANEL 10
Endomysial Screen: NEGATIVE
GLIADIN IGA: 9 U (ref <20)
Gliadin IgG: 2 Units (ref <20)
IGA: 271 mg/dL (ref 69–380)
TISSUE TRANSGLUT AB: 1 U/mL (ref <6)
Tissue Transglutaminase Ab, IgA: 1 U/mL (ref <4)

## 2015-03-26 MED ORDER — SODIUM CHLORIDE 0.9 % IV SOLN: 500.0000 mL | INTRAVENOUS | Status: DC

## 2015-03-26 NOTE — Progress Notes (Signed)
Called to room to assist during endoscopic procedure.  Patient ID and intended procedure confirmed with present staff. Received instructions for my participation in the procedure from the performing physician.  

## 2015-03-26 NOTE — Op Note (Signed)
[facility] [address] [place], [id number]   ENDOSCOPY PROCEDURE REPORT  PATIENT: Dawn Price, [name]  MR#: [id number] BIRTHDATE: 1986/09/28 , 28  yrs. old GENDER: female ENDOSCOPIST: Rachael Feeaniel P Krystyl Cannell, MD PROCEDURE DATE:  03/26/2015 PROCEDURE:  EGD w/ biopsy ASA CLASS:     Class I INDICATIONS:  post prandial abdominal pain, cramping. MEDICATIONS: Monitored anesthesia care and Propofol 100 mg IV TOPICAL ANESTHETIC: none  DESCRIPTION OF PROCEDURE: After the risks benefits and alternatives of the procedure were thoroughly explained, informed consent was obtained.  The LB [id number] endoscope was introduced through the mouth and advanced to the second portion of the duodenum , Without limitations.  The instrument was slowly withdrawn as the mucosa was fully examined.  There was mild non-specific distal gastritis.  This was biopsied and sent to pathology.  The examination was otherwise normal. Retroflexed views revealed no abnormalities.     The scope was then withdrawn from the patient and the procedure completed. COMPLICATIONS: There were no immediate complications.  ENDOSCOPIC IMPRESSION: There was mild non-specific distal gastritis.  This was biopsied and sent to pathology.  The examination was otherwise normal  RECOMMENDATIONS: Await final pathology results.   eSigned:  Rachael Feeaniel P Jacayla Nordell, MD 03/26/2015 4:03 PM

## 2015-03-26 NOTE — Progress Notes (Signed)
Report to PACU, RN, vss, BBS= Clear.  

## 2015-03-26 NOTE — Patient Instructions (Signed)
YOU HAD AN ENDOSCOPIC PROCEDURE TODAY AT THE [facility]:   Refer to the procedure report that was given to you for any specific questions about what was found during the examination.  If the procedure report does not answer your questions, please call your gastroenterologist to clarify.  If you requested that your care partner not be given the details of your procedure findings, then the procedure report has been included in a sealed envelope for you to review at your convenience later.  YOU SHOULD EXPECT: Some feelings of bloating in the abdomen. Passage of more gas than usual.  Walking can help get rid of the air that was put into your GI tract during the procedure and reduce the bloating. If you had a lower endoscopy (such as a colonoscopy or flexible sigmoidoscopy) you may notice spotting of blood in your stool or on the toilet paper. If you underwent a bowel prep for your procedure, you may not have a normal bowel movement for a few days.  Please Note:  You might notice some irritation and congestion in your nose or some drainage.  This is from the oxygen used during your procedure.  There is no need for concern and it should clear up in a day or so.  SYMPTOMS TO REPORT IMMEDIATELY:   Following upper endoscopy (EGD)  Vomiting of blood or coffee ground material  New chest pain or pain under the shoulder blades  Painful or persistently difficult swallowing  New shortness of breath  Fever of 100F or higher  Black, tarry-looking stools  For urgent or emergent issues, a gastroenterologist can be reached at any hour by calling [phone].   DIET: Your first meal following the procedure should be a small meal and then it is ok to progress to your normal diet. Heavy or fried foods are harder to digest and may make you feel nauseous or bloated.  Likewise, meals heavy in dairy and vegetables can increase bloating.  Drink plenty of fluids but you should avoid alcoholic beverages for  24 hours.  ACTIVITY:  You should plan to take it easy for the rest of today and you should NOT DRIVE or use heavy machinery until tomorrow (because of the sedation medicines used during the test).    FOLLOW UP: Our staff will call the number listed on your records the next business day following your procedure to check on you and address any questions or concerns that you may have regarding the information given to you following your procedure. If we do not reach you, we will leave a message.  However, if you are feeling well and you are not experiencing any problems, there is no need to return our call.  We will assume that you have returned to your regular daily activities without incident.  If any biopsies were taken you will be contacted by phone or by letter within the next 1-3 weeks.  Please call us at [phone] if you have not heard about the biopsies in 3 weeks.    SIGNATURES/CONFIDENTIALITY: You and/or your care partner have signed paperwork which will be entered into your electronic medical record.  These signatures attest to the fact that that the information above on your After Visit Summary has been reviewed and is understood.  Full responsibility of the confidentiality of this discharge information lies with you and/or your care-partner.  Please review gastritis handout provided. Continue current medications.

## 2015-03-27 ENCOUNTER — Telehealth: Payer: Self-pay | Admitting: Family Medicine

## 2015-03-27 ENCOUNTER — Telehealth: Payer: Self-pay | Admitting: *Deleted

## 2015-03-27 ENCOUNTER — Ambulatory Visit (INDEPENDENT_AMBULATORY_CARE_PROVIDER_SITE_OTHER): Payer: BLUE CROSS/BLUE SHIELD | Admitting: Family Medicine

## 2015-03-27 VITALS — BP 100/60 | HR 53 | Temp 98.1°F | Resp 16 | Ht 60.0 in | Wt 85.4 lb

## 2015-03-27 DIAGNOSIS — R102 Pelvic and perineal pain: Secondary | ICD-10-CM | POA: Diagnosis not present

## 2015-03-27 DIAGNOSIS — R3915 Urgency of urination: Secondary | ICD-10-CM | POA: Diagnosis not present

## 2015-03-27 LAB — POCT URINALYSIS DIPSTICK
BILIRUBIN UA: NEGATIVE
Glucose, UA: NEGATIVE
Ketones, UA: NEGATIVE
LEUKOCYTES UA: NEGATIVE
NITRITE UA: NEGATIVE
PH UA: 7.5
Protein, UA: NEGATIVE
RBC UA: NEGATIVE
SPEC GRAV UA: 1.015
Urobilinogen, UA: 0.2

## 2015-03-27 LAB — POCT UA - MICROSCOPIC ONLY
Casts, Ur, LPF, POC: NEGATIVE
Crystals, Ur, HPF, POC: NEGATIVE
MUCUS UA: NEGATIVE
Yeast, UA: NEGATIVE

## 2015-03-27 MED ORDER — TAMSULOSIN HCL 0.4 MG PO CAPS: 0.4000 mg | ORAL_CAPSULE | Freq: Every day | ORAL | Status: DC

## 2015-03-27 NOTE — Progress Notes (Signed)
Subjective:    Patient ID: Dawn Price, female    DOB: 09-22-1986, 29 y.o.   MRN: [id number]  03/27/2015  Follow-up   HPI This 29 y.o. female presents for evaluation of pelvic pain.  Has undergone an extensive work up thus far with multiple ED visits, gynecological evaluation, and GI evaluation; no etiology to symptoms has been identified thus far.  Having lower pelvic pain; very sharp pains. Not like menstrual cramp. Has been treated for PID with abx without improvement. Has started swelling in lower pelvic region in bladder area; can feel tough area.  Worried about urological problem. S/p gynecology in [facility]; ruled out PID.  S/p four pelvic US in past two months.  Menses are regular.    5 separate ED visits. S/p ED visit on 03/23/15.  S/p Toradol injection. 03/25/15 Celiac panel negative. Urine culture negative.  Urine pregnancy  Negative on 03/20/15.  CBC, CMET, Lipase WNL. Wet prep on 03/15/15 with rare yeast and many clues. GC/Chlam negative on 03/15/15.  S/p GI consultation with EGD by Ardis Hughs on 03/26/2015 WNL.  No further follow-up warranted.  S/p biopsy gastric; awaiting bx results.  +nausea for past two days.    CT 01/2015: prominent stool; R ovarian cyst simple; thickened endometrium. Pelvic US 02/2015 WNL other than thickened endometrium.  Swelling started after ED visit two months ago.   Current pain is different than original pelvic pain two months ago; feels that pelvic pain two months ago due to ovarian cyst; repeat pelvic US has confirmed resolution of ovarian cyst.  Has suffered with UTIs four times in past year.  Made appointment with urology on 04/08/15.  Had to leave work due to pelvic pressure; feels like bladder going to explode.  Bladder pressure and radiates to back.  Intermittent.  Urinating frequently; pants are tight due to pelvic swelling/bloating.  Nocturia x 1.  Pyridium use; always orange urine.  Used Pyridium yesterday.  Finished abx; Bactrim last night.   Last two days, in bed; tried to return to work today but had to leave due to pressure.  Walking makes pressure worse; any time stomach is full.  Having bowel movement daily; feels more pressure before having bowel movement; after bowel movement, symptoms improve.  Also, with bladder is full, pain gets worse.  Did take medicine for large stool burden appreciated on CT scan; took Miralax and Mg citrate; stopped medication in the past month.  Previously constipated before medication; took medication one month ago.    Fiance is internist in [place].  Requesting to speak to MD.  LMP 02-26-2015; regular menses; planning to repeat pelvic US after next menses to evaluate thickened endometrium.  Plans to contact gynecology to schedule repeat pelvic US.    AAS 02/24/15; WNL at [facility].  Results reviewed in Care Everywhere.  Has lost ten pounds in past two months due to pai.   Review of Systems  Constitutional: Positive for unexpected weight change. Negative for fever, chills, diaphoresis and fatigue.  Respiratory: Negative for shortness of breath.   Cardiovascular: Negative for chest pain, palpitations and leg swelling.  Gastrointestinal: Positive for nausea. Negative for vomiting, abdominal pain, diarrhea, constipation, blood in stool, abdominal distention, anal bleeding and rectal pain.  Genitourinary: Positive for urgency, frequency, flank pain and pelvic pain. Negative for dysuria, hematuria, decreased urine volume, vaginal bleeding, vaginal discharge, enuresis, difficulty urinating, genital sores, vaginal pain and menstrual problem.  Skin: Negative for rash.  Neurological: Negative for dizziness, light-headedness and headaches.  Psychiatric/Behavioral:  Negative for dysphoric mood. The patient is not nervous/anxious.     Past Medical History  Diagnosis Date  . Anxiety   . Major depressive disorder 06/26/2014  . Vitamin D deficiency   . PID (acute pelvic inflammatory disease)   . UTI (urinary tract  infection)   . Hemorrhoids    Past Surgical History  Procedure Laterality Date  . No past surgeries     Allergies  Allergen Reactions  . Almond Oil Rash  . Doxycycline Nausea Only  . Peanuts [Peanut Oil] Rash   History   Social History  . Marital Status: Single    Spouse Name: N/A  . Number of Children: 0  . Years of Education: N/A   Occupational History  . Doctor-PHD [employer]    Social History Main Topics  . Smoking status: Never Smoker   . Smokeless tobacco: Never Used  . Alcohol Use: 0.0 oz/week    0 Standard drinks or equivalent per week     Comment: occasionally, pt reports 2 x a week  . Drug Use: No  . Sexual Activity: Yes   Other Topics Concern  . Not on file   Social History Narrative   Marital status: engaged      Children: none      Lives: alone      Employment: completed PhD; working at [employer]; postdoctorate degree      Tobacco: none      Alcohol: socially      Exercise:  Not currently; before twice per week.     Family History  Problem Relation Age of Onset  . Colon cancer Neg Hx   . Colon polyps Neg Hx   . Diabetes Neg Hx   . Esophageal cancer Neg Hx   . Rectal cancer Neg Hx   . Stomach cancer Neg Hx   . Kidney disease Maternal Aunt   . Heart disease Maternal Aunt   . Gallbladder disease Maternal Aunt         Objective:    BP 100/60 mmHg  Pulse 53  Temp(Src) 98.1 F (36.7 C) (Oral)  Resp 16  Ht 5' (1.524 m)  Wt 85 lb 6 oz (38.726 kg)  BMI 16.67 kg/m2  SpO2 92%  LMP 02/26/2015 Physical Exam  Constitutional: She is oriented to person, place, and time. She appears well-developed and well-nourished. No distress.  HENT:  Head: Normocephalic and atraumatic.  Right Ear: External ear normal.  Left Ear: External ear normal.  Nose: Nose normal.  Mouth/Throat: Oropharynx is clear and moist.  Eyes: Conjunctivae and EOM are normal. Pupils are equal, round, and reactive to light.  Neck: Normal range of motion. Neck supple. Carotid bruit  is not present. No thyromegaly present.  Cardiovascular: Normal rate, regular rhythm, normal heart sounds and intact distal pulses.  Exam reveals no gallop and no friction rub.   No murmur heard. Pulmonary/Chest: Effort normal and breath sounds normal. She has no wheezes. She has no rales.  Abdominal: Soft. Bowel sounds are normal. She exhibits no distension and no mass. There is no hepatosplenomegaly. There is tenderness in the suprapubic area. There is no rebound, no guarding and no CVA tenderness. No hernia. Hernia confirmed negative in the right inguinal area and confirmed negative in the left inguinal area.  Genitourinary: Vagina normal and uterus normal. There is no rash, tenderness or lesion on the right labia. There is no rash, tenderness or lesion on the left labia. Cervix exhibits no motion tenderness, no discharge and  no friability. Right adnexum displays no mass, no tenderness and no fullness. Left adnexum displays no mass, no tenderness and no fullness.  Lymphadenopathy:    She has no cervical adenopathy.  Neurological: She is alert and oriented to person, place, and time. No cranial nerve deficit.  Skin: Skin is warm and dry. No rash noted. She is not diaphoretic. No erythema. No pallor.  Psychiatric: She has a normal mood and affect. Her behavior is normal.        Assessment & Plan:   1. Pelvic pain in female   2. Urinary urgency    -New.   -Extensive work up with negative results thus far including CT abd/pelvis with contrast, pelvic US x 4, KUB x 1, extensive lab work.   -Treated for UTI and PID without improvement. -s/p GI consultation and Gyn evaluations. -Scheduled for urology evaluation on 04/08/15; very important to keep this appointment.   -Agreeable to repeating urine culture today and to obtaining ESR and CRP per fiance request. -Also agreeable to CT no contrast to evaluation for nephrolithiasis. -Primarily pelvic pain with urinary urgency; will treat with Flomax  until urological evaluation.   -Will also reevaluate stool burden on CT of abdomen and pelvis to confirm that constipation has improved.  Meds ordered this encounter  Medications  . tamsulosin (FLOMAX) 0.4 MG CAPS capsule    Sig: Take 1 capsule (0.4 mg total) by mouth daily.    Dispense:  30 capsule    Refill:  0    No Follow-up on file.   Abhishek Levesque Elayne Guerin, M.D. [facility] [address] [place], [place]  [address] [phone] phone [phone] fax

## 2015-03-27 NOTE — Telephone Encounter (Addendum)
Patient was recently hospitalized for severe pelvic pain. She plans to come in this evening however she is unsure what we will be able to do for  Her. She does come here for her primary care and wanted to let our staff know what is going on.   [phone]

## 2015-03-27 NOTE — Telephone Encounter (Signed)
No answer, message left for the patient. 

## 2015-03-28 ENCOUNTER — Encounter: Payer: Self-pay | Admitting: Family Medicine

## 2015-03-28 ENCOUNTER — Telehealth: Payer: Self-pay | Admitting: *Deleted

## 2015-03-28 ENCOUNTER — Ambulatory Visit (HOSPITAL_COMMUNITY)
Admission: RE | Admit: 2015-03-28 | Discharge: 2015-03-28 | Disposition: A | Discharge status: Home / Self Care | Payer: BLUE CROSS/BLUE SHIELD | Source: Ambulatory Visit | Attending: Family Medicine | Admitting: Family Medicine

## 2015-03-28 DIAGNOSIS — R102 Pelvic and perineal pain: Secondary | ICD-10-CM | POA: Insufficient documentation

## 2015-03-28 DIAGNOSIS — N23 Unspecified renal colic: Secondary | ICD-10-CM | POA: Insufficient documentation

## 2015-03-28 DIAGNOSIS — R3915 Urgency of urination: Secondary | ICD-10-CM

## 2015-03-28 LAB — SEDIMENTATION RATE: Sed Rate: 4 mm/hr (ref 0–20)

## 2015-03-28 LAB — C-REACTIVE PROTEIN

## 2015-03-28 NOTE — Telephone Encounter (Signed)
Pt was called and advised to [facility] at [facility]30 pm for her scheduled CT.  She was told to go to the first floor to the Radiology department.  Pt understood

## 2015-03-29 LAB — URINE CULTURE
Colony Count: NO GROWTH
Organism ID, Bacteria: NO GROWTH

## 2015-03-31 ENCOUNTER — Telehealth: Payer: Self-pay

## 2015-03-31 ENCOUNTER — Other Ambulatory Visit (HOSPITAL_COMMUNITY): Payer: Self-pay | Admitting: Emergency Medicine

## 2015-03-31 DIAGNOSIS — R102 Pelvic and perineal pain: Secondary | ICD-10-CM

## 2015-03-31 NOTE — Telephone Encounter (Signed)
Labs and CT results sent to lab pool to advise pt of results.

## 2015-03-31 NOTE — Telephone Encounter (Signed)
Pt calling about labs and CT scan. Please review. Thanks

## 2015-04-01 ENCOUNTER — Encounter: Payer: Self-pay | Admitting: Gastroenterology

## 2015-04-07 ENCOUNTER — Other Ambulatory Visit: Payer: Self-pay

## 2015-04-09 ENCOUNTER — Other Ambulatory Visit: Payer: Self-pay

## 2015-05-02 ENCOUNTER — Ambulatory Visit
Admission: RE | Admit: 2015-05-02 | Discharge: 2015-05-02 | Disposition: A | Discharge status: Home / Self Care | Payer: BLUE CROSS/BLUE SHIELD | Source: Ambulatory Visit | Attending: Emergency Medicine | Admitting: Emergency Medicine

## 2015-05-02 DIAGNOSIS — R102 Pelvic and perineal pain: Secondary | ICD-10-CM

## 2015-05-08 ENCOUNTER — Telehealth: Payer: Self-pay | Admitting: Gastroenterology

## 2015-05-09 ENCOUNTER — Encounter (HOSPITAL_COMMUNITY): Payer: Self-pay | Admitting: Emergency Medicine

## 2015-05-09 ENCOUNTER — Emergency Department (HOSPITAL_COMMUNITY)
Admission: EM | Admit: 2015-05-09 | Discharge: 2015-05-10 | Disposition: A | Discharge status: Home / Self Care | Payer: BLUE CROSS/BLUE SHIELD | Attending: Emergency Medicine | Admitting: Emergency Medicine

## 2015-05-09 DIAGNOSIS — R102 Pelvic and perineal pain: Secondary | ICD-10-CM | POA: Insufficient documentation

## 2015-05-09 DIAGNOSIS — R300 Dysuria: Secondary | ICD-10-CM | POA: Diagnosis not present

## 2015-05-09 DIAGNOSIS — Z8639 Personal history of other endocrine, nutritional and metabolic disease: Secondary | ICD-10-CM | POA: Diagnosis not present

## 2015-05-09 DIAGNOSIS — Z8742 Personal history of other diseases of the female genital tract: Secondary | ICD-10-CM | POA: Diagnosis not present

## 2015-05-09 DIAGNOSIS — G8929 Other chronic pain: Secondary | ICD-10-CM

## 2015-05-09 DIAGNOSIS — Z8659 Personal history of other mental and behavioral disorders: Secondary | ICD-10-CM | POA: Diagnosis not present

## 2015-05-09 DIAGNOSIS — R140 Abdominal distension (gaseous): Secondary | ICD-10-CM | POA: Insufficient documentation

## 2015-05-09 DIAGNOSIS — R1030 Lower abdominal pain, unspecified: Secondary | ICD-10-CM | POA: Diagnosis present

## 2015-05-09 DIAGNOSIS — Z87440 Personal history of urinary (tract) infections: Secondary | ICD-10-CM | POA: Diagnosis not present

## 2015-05-09 DIAGNOSIS — Z8719 Personal history of other diseases of the digestive system: Secondary | ICD-10-CM | POA: Diagnosis not present

## 2015-05-09 DIAGNOSIS — Z79899 Other long term (current) drug therapy: Secondary | ICD-10-CM | POA: Insufficient documentation

## 2015-05-09 LAB — URINALYSIS, ROUTINE W REFLEX MICROSCOPIC
Bilirubin Urine: NEGATIVE
Glucose, UA: NEGATIVE mg/dL
HGB URINE DIPSTICK: NEGATIVE
KETONES UR: 15 mg/dL — AB
Leukocytes, UA: NEGATIVE
Nitrite: NEGATIVE
PROTEIN: NEGATIVE mg/dL
SPECIFIC GRAVITY, URINE: 1.018 (ref 1.005–1.030)
UROBILINOGEN UA: 0.2 mg/dL (ref 0.0–1.0)
pH: 5.5 (ref 5.0–8.0)

## 2015-05-09 LAB — CBC
HCT: 37.3 % (ref 36.0–46.0)
Hemoglobin: 12.6 g/dL (ref 12.0–15.0)
MCH: 30.5 pg (ref 26.0–34.0)
MCHC: 33.8 g/dL (ref 30.0–36.0)
MCV: 90.3 fL (ref 78.0–100.0)
Platelets: 282 10*3/uL (ref 150–400)
RBC: 4.13 MIL/uL (ref 3.87–5.11)
RDW: 12.1 % (ref 11.5–15.5)
WBC: 9.6 10*3/uL (ref 4.0–10.5)

## 2015-05-09 LAB — COMPREHENSIVE METABOLIC PANEL
ALK PHOS: 59 U/L (ref 38–126)
ALT: 14 U/L (ref 14–54)
ANION GAP: 5 (ref 5–15)
AST: 18 U/L (ref 15–41)
Albumin: 3.8 g/dL (ref 3.5–5.0)
BUN: 6 mg/dL (ref 6–20)
CO2: 26 mmol/L (ref 22–32)
Calcium: 9 mg/dL (ref 8.9–10.3)
Chloride: 109 mmol/L (ref 101–111)
Creatinine, Ser: 0.58 mg/dL (ref 0.44–1.00)
GFR calc non Af Amer: >60 mL/min (ref >60)
GLUCOSE: 78 mg/dL (ref 65–99)
Potassium: 3.8 mmol/L (ref 3.5–5.1)
Sodium: 140 mmol/L (ref 135–145)
Total Bilirubin: 0.6 mg/dL (ref 0.3–1.2)
Total Protein: 6.3 g/dL — ABNORMAL LOW (ref 6.5–8.1)

## 2015-05-09 LAB — LIPASE, BLOOD: LIPASE: 30 U/L (ref 22–51)

## 2015-05-09 LAB — I-STAT BETA HCG BLOOD, ED (MC, WL, AP ONLY)

## 2015-05-09 MED ORDER — KETOROLAC TROMETHAMINE 60 MG/2ML IM SOLN
60.0000 mg | Freq: Once | INTRAMUSCULAR | Status: AC
  Administered 2015-05-09: 60 mg via INTRAMUSCULAR
  Filled 2015-05-09: qty 2

## 2015-05-09 NOTE — ED Notes (Signed)
Pt. reports low abdominal pain with swelling and dysuria onset April this year , pt. states history of UTI and PID , denies fever /occasional chills.

## 2015-05-09 NOTE — ED Notes (Signed)
Pt reports she has been experiencing mid lower abdominal pain since April. She has been seen [facility], OBGYN, urologist, GI and no one has been able to tell her why she is having this pain. She has had CT scans, x-rays, US and they all have been normal.

## 2015-05-09 NOTE — Telephone Encounter (Signed)
Pt has been notified of the results and was advised a letter was mailed out and to call if she does not receive it or has further questions

## 2015-05-09 NOTE — Discharge Instructions (Signed)
1. Medications: usual home medications 2. Treatment: rest, drink plenty of fluids,  3. Follow Up: Please followup with your primary doctor in 2 days for discussion of your diagnoses and further evaluation after today's visit; if you do not have a primary care doctor use the resource guide provided to find one; Please return to the ER for worsening symptoms    Abdominal Pain, Women Abdominal (stomach, pelvic, or belly) pain can be caused by many things. It is important to tell your doctor:  The location of the pain.  Does it come and go or is it present all the time?  Are there things that start the pain (eating certain foods, exercise)?  Are there other symptoms associated with the pain (fever, nausea, vomiting, diarrhea)? All of this is helpful to know when trying to find the cause of the pain. CAUSES   Stomach: virus or bacteria infection, or ulcer.  Intestine: appendicitis (inflamed appendix), regional ileitis (Crohn's disease), ulcerative colitis (inflamed colon), irritable bowel syndrome, diverticulitis (inflamed diverticulum of the colon), or cancer of the stomach or intestine.  Gallbladder disease or stones in the gallbladder.  Kidney disease, kidney stones, or infection.  Pancreas infection or cancer.  Fibromyalgia (pain disorder).  Diseases of the female organs:  Uterus: fibroid (non-cancerous) tumors or infection.  Fallopian tubes: infection or tubal pregnancy.  Ovary: cysts or tumors.  Pelvic adhesions (scar tissue).  Endometriosis (uterus lining tissue growing in the pelvis and on the pelvic organs).  Pelvic congestion syndrome (female organs filling up with blood just before the menstrual period).  Pain with the menstrual period.  Pain with ovulation (producing an egg).  Pain with an IUD (intrauterine device, birth control) in the uterus.  Cancer of the female organs.  Functional pain (pain not caused by a disease, may improve without  treatment).  Psychological pain.  Depression. DIAGNOSIS  Your doctor will decide the seriousness of your pain by doing an examination.  Blood tests.  X-rays.  Ultrasound.  CT scan (computed tomography, special type of X-ray).  MRI (magnetic resonance imaging).  Cultures, for infection.  Barium enema (dye inserted in the large intestine, to better view it with X-rays).  Colonoscopy (looking in intestine with a lighted tube).  Laparoscopy (minor surgery, looking in abdomen with a lighted tube).  Major abdominal exploratory surgery (looking in abdomen with a large incision). TREATMENT  The treatment will depend on the cause of the pain.   Many cases can be observed and treated at home.  Over-the-counter medicines recommended by your caregiver.  Prescription medicine.  Antibiotics, for infection.  Birth control pills, for painful periods or for ovulation pain.  Hormone treatment, for endometriosis.  Nerve blocking injections.  Physical therapy.  Antidepressants.  Counseling with a psychologist or psychiatrist.  Minor or major surgery. HOME CARE INSTRUCTIONS   Do not take laxatives, unless directed by your caregiver.  Take over-the-counter pain medicine only if ordered by your caregiver. Do not take aspirin because it can cause an upset stomach or bleeding.  Try a clear liquid diet (broth or water) as ordered by your caregiver. Slowly move to a bland diet, as tolerated, if the pain is related to the stomach or intestine.  Have a thermometer and take your temperature several times a day, and record it.  Bed rest and sleep, if it helps the pain.  Avoid sexual intercourse, if it causes pain.  Avoid stressful situations.  Keep your follow-up appointments and tests, as your caregiver orders.  If the  pain does not go away with medicine or surgery, you may try:  Acupuncture.  Relaxation exercises (yoga, meditation).  Group therapy.  Counseling. SEEK  MEDICAL CARE IF:   You notice certain foods cause stomach pain.  Your home care treatment is not helping your pain.  You need stronger pain medicine.  You want your IUD removed.  You feel faint or lightheaded.  You develop nausea and vomiting.  You develop a rash.  You are having side effects or an allergy to your medicine. SEEK IMMEDIATE MEDICAL CARE IF:   Your pain does not go away or gets worse.  You have a fever.  Your pain is felt only in portions of the abdomen. The right side could possibly be appendicitis. The left lower portion of the abdomen could be colitis or diverticulitis.  You are passing blood in your stools (bright red or black tarry stools, with or without vomiting).  You have blood in your urine.  You develop chills, with or without a fever.  You pass out. MAKE SURE YOU:   Understand these instructions.  Will watch your condition.  Will get help right away if you are not doing well or get worse. Document Released: 08/08/2007 Document Revised: 02/25/2014 Document Reviewed: 08/28/2009 [facility] Patient Information 2015 [place], [place]. This information is not intended to replace advice given to you by your health care provider. Make sure you discuss any questions you have with your health care provider.

## 2015-05-09 NOTE — ED Provider Notes (Signed)
CSN: [id number]     Arrival date & time 05/09/15  2150 History   First MD Initiated Contact with Patient 05/09/15 2219     Chief Complaint  Patient presents with  . Abdominal Pain  . Dysuria     (Consider location/radiation/quality/duration/timing/severity/associated sxs/prior Treatment) The history is provided by the patient and medical records. No language interpreter was used.     Dawn Price is a 29 y.o. female  with a hx of recurrent UTI, PID presents to the Emergency Department complaining of gradual, persistent, progressively worsening lower abd pain onset April 2016.  Pt reports she has been seen by GI, OB/GYN and Urology.  She reports she has been having UTI symptoms every other week.  Pt reports she finished Bactrim at the end of June and was symptom free until 2 days ago. Associated symptoms include dysuria, lower abdominal swelling, nausea without vomiting, chills without fever.  Nothing makes it better and eating and drinking makes it worse.  Pt denies fever, headache, neck pain, chest pain, SOB, vomiting, diarrhea, melena, hematochezia.    Pt has an appointment with urology next week.    Christella Hartigan - GI  Vernie Ammons - Urology   Past Medical History  Diagnosis Date  . Anxiety   . Major depressive disorder 06/26/2014  . Vitamin D deficiency   . PID (acute pelvic inflammatory disease)   . UTI (urinary tract infection)   . Hemorrhoids    Past Surgical History  Procedure Laterality Date  . No past surgeries     Family History  Problem Relation Age of Onset  . Colon cancer Neg Hx   . Colon polyps Neg Hx   . Diabetes Neg Hx   . Esophageal cancer Neg Hx   . Rectal cancer Neg Hx   . Stomach cancer Neg Hx   . Kidney disease Maternal Aunt   . Heart disease Maternal Aunt   . Gallbladder disease Maternal Aunt    History  Substance Use Topics  . Smoking status: Never Smoker   . Smokeless tobacco: Never Used  . Alcohol Use: Yes   OB History    No data available      Review of Systems  Constitutional: Negative for fever, diaphoresis, appetite change, fatigue and unexpected weight change.  HENT: Negative for mouth sores.   Eyes: Negative for visual disturbance.  Respiratory: Negative for cough, chest tightness, shortness of breath and wheezing.   Cardiovascular: Negative for chest pain.  Gastrointestinal: Positive for abdominal pain and abdominal distention. Negative for nausea, vomiting, diarrhea and constipation.  Endocrine: Negative for polydipsia, polyphagia and polyuria.  Genitourinary: Positive for dysuria. Negative for urgency, frequency and hematuria.  Musculoskeletal: Negative for back pain and neck stiffness.  Skin: Negative for rash.  Allergic/Immunologic: Negative for immunocompromised state.  Neurological: Negative for syncope, light-headedness and headaches.  Hematological: Does not bruise/bleed easily.  Psychiatric/Behavioral: Negative for sleep disturbance. The patient is not nervous/anxious.       Allergies  Almond oil; Doxycycline; and Peanuts  Home Medications   Prior to Admission medications   Medication Sig Start Date End Date Taking? Authorizing Provider  polyethylene glycol (MIRALAX / GLYCOLAX) packet Take 17 g by mouth daily.   Yes Historical Provider, MD  tamsulosin (FLOMAX) 0.4 MG CAPS capsule Take 1 capsule (0.4 mg total) by mouth daily. 03/27/15  Yes Ethelda Chick, MD  hydrocodone-ibuprofen (VICOPROFEN) 5-200 MG per tablet Take 1 tablet by mouth every 8 (eight) hours as needed for pain. Patient not taking:  Reported on 05/09/2015 01/29/15   Eyvonne MechanicJeffrey Hedges, PA-C  Lactobacillus (ACIDOPHILUS PROBIOTIC) 10 MG TABS Take 10 mg by mouth 3 (three) times daily. Patient not taking: Reported on 03/27/2015 03/16/15   Everlene FarrierWilliam Dansie, PA-C  meloxicam (MOBIC) 7.5 MG tablet Take 1 tablet (7.5 mg total) by mouth 2 (two) times daily. Patient not taking: Reported on 05/09/2015 03/23/15   Earley FavorGail Schulz, NP  naproxen (NAPROSYN) 375 MG tablet Take  1 tablet (375 mg total) by mouth 2 (two) times daily. Patient not taking: Reported on 05/09/2015 03/20/15   Arthor CaptainAbigail Harris, PA-C  phenazopyridine (PYRIDIUM) 200 MG tablet Take 1 tablet (200 mg total) by mouth 3 (three) times daily. Patient not taking: Reported on 05/09/2015 03/23/15   Earley FavorGail Schulz, NP   BP 99/59 mmHg  Pulse 77  Temp(Src) 97.8 F (36.6 C) (Oral)  Resp 16  Ht 4' 11.75" (1.518 m)  Wt 87 lb (39.463 kg)  BMI 17.13 kg/m2  SpO2 100%  LMP 04/28/2015 Physical Exam  Constitutional: She appears well-developed and well-nourished. No distress.  Awake, alert, nontoxic appearance  HENT:  Head: Normocephalic and atraumatic.  Mouth/Throat: Oropharynx is clear and moist. No oropharyngeal exudate.  Eyes: Conjunctivae are normal. No scleral icterus.  Neck: Normal range of motion. Neck supple.  Cardiovascular: Normal rate, regular rhythm and intact distal pulses.   Pulmonary/Chest: Effort normal and breath sounds normal. No respiratory distress. She has no wheezes.  Equal chest expansion  Abdominal: Soft. Bowel sounds are normal. She exhibits no distension and no mass. There is tenderness (mild) in the suprapubic area. There is no rebound, no guarding and no CVA tenderness.  Musculoskeletal: Normal range of motion. She exhibits no edema.  Neurological: She is alert.  Speech is clear and goal oriented Moves extremities without ataxia  Skin: Skin is warm and dry. She is not diaphoretic.  Psychiatric: She has a normal mood and affect.  Nursing note and vitals reviewed.   ED Course  Procedures (including critical care time) Labs Review Labs Reviewed  COMPREHENSIVE METABOLIC PANEL - Abnormal; Notable for the following:    Total Protein 6.3 (*)    All other components within normal limits  URINALYSIS, ROUTINE W REFLEX MICROSCOPIC (NOT AT [facility]) - Abnormal; Notable for the following:    Ketones, ur 15 (*)    All other components within normal limits  LIPASE, BLOOD  CBC  I-STAT BETA HCG  BLOOD, ED ([facility], [facility], [facility] ONLY)    Imaging Review No results found.   EKG Interpretation None      MDM   Final diagnoses:  Chronic pelvic pain in female   Alyssa GroveLakmini Sheahan since the emergency department for persistent lower abdominal pain and bloating. Patient has had these symptoms since April 2016. She's been seen 7 times last 6 months for the same symptoms. On record review she's had 3 abdominal ultrasounds several with increased endometrial lining. She's had 2 CT scans 1 with constipation and 1 with a right ovarian cyst which has since resolved. On 03/26/2015 she had an EGD by Dr. Christella HartiganJacobs which was within normal limits. Patient was negative for H. pylori and celiac sprue during this EGD. During one of her ED visit she was treated for PID and subsequent GYN follow-up ruled out a true PID infection.  She has seen urology for persistent dysuria as well as. No evidence of nephrolithiasis. She reports a 10 pound weight loss.  She presents today frustrated with persistent symptoms.  Abdomen is soft with minimal tenderness over the  suprapubic area. No abdominal distention or bloating noted today.  Labs are reassuring. No evidence of urinary tract infection. Patient denies change in her vaginal discharge and I doubt PID at this time.  She is well appearing. No indication of appendicitis, bowel obstruction, bowel perforation, cholecystitis, diverticulitis, PID or ectopic pregnancy.  Patient discharged home with symptomatic treatment and given strict instructions for follow-up with their primary care physician.  I have also discussed reasons to return immediately to the ER.  Patient expresses understanding and agrees with plan.  The patient was discussed with and seen by Dr. Littie Deeds who agrees with the treatment plan.      Dahlia Client Lova Urbieta, PA-C 05/09/15 2351  Mirian Mo, MD 05/10/15 [phone]

## 2015-05-10 NOTE — ED Notes (Signed)
Pt A&OX4, ambulatory at d/c with steady gait, NAD 

## 2015-05-27 ENCOUNTER — Other Ambulatory Visit: Payer: Self-pay | Admitting: Urology

## 2015-06-02 ENCOUNTER — Encounter (HOSPITAL_BASED_OUTPATIENT_CLINIC_OR_DEPARTMENT_OTHER): Payer: Self-pay | Admitting: *Deleted

## 2015-06-25 ENCOUNTER — Encounter (HOSPITAL_BASED_OUTPATIENT_CLINIC_OR_DEPARTMENT_OTHER): Payer: Self-pay | Admitting: *Deleted

## 2015-06-25 NOTE — Progress Notes (Signed)
NPO AFTER MN.  ARRIVE AT 0600.  NEEDS HG AND URINE PREG.  

## 2015-06-27 ENCOUNTER — Encounter (HOSPITAL_BASED_OUTPATIENT_CLINIC_OR_DEPARTMENT_OTHER): Payer: Self-pay | Admitting: *Deleted

## 2015-06-27 ENCOUNTER — Encounter (HOSPITAL_BASED_OUTPATIENT_CLINIC_OR_DEPARTMENT_OTHER)
Admission: RE | Disposition: A | Discharge status: Home / Self Care | Payer: Self-pay | Source: Ambulatory Visit | Attending: Urology

## 2015-06-27 ENCOUNTER — Ambulatory Visit (HOSPITAL_BASED_OUTPATIENT_CLINIC_OR_DEPARTMENT_OTHER): Payer: No Typology Code available for payment source | Admitting: Anesthesiology

## 2015-06-27 ENCOUNTER — Ambulatory Visit (HOSPITAL_BASED_OUTPATIENT_CLINIC_OR_DEPARTMENT_OTHER)
Admission: RE | Admit: 2015-06-27 | Discharge: 2015-06-27 | Disposition: A | Discharge status: Home / Self Care | Payer: No Typology Code available for payment source | Source: Ambulatory Visit | Attending: Urology | Admitting: Urology

## 2015-06-27 DIAGNOSIS — R350 Frequency of micturition: Secondary | ICD-10-CM | POA: Insufficient documentation

## 2015-06-27 DIAGNOSIS — R3911 Hesitancy of micturition: Secondary | ICD-10-CM | POA: Diagnosis not present

## 2015-06-27 DIAGNOSIS — N359 Urethral stricture, unspecified: Secondary | ICD-10-CM | POA: Diagnosis not present

## 2015-06-27 DIAGNOSIS — K219 Gastro-esophageal reflux disease without esophagitis: Secondary | ICD-10-CM | POA: Diagnosis not present

## 2015-06-27 DIAGNOSIS — N3281 Overactive bladder: Secondary | ICD-10-CM | POA: Insufficient documentation

## 2015-06-27 DIAGNOSIS — Z87440 Personal history of urinary (tract) infections: Secondary | ICD-10-CM | POA: Diagnosis not present

## 2015-06-27 DIAGNOSIS — R109 Unspecified abdominal pain: Secondary | ICD-10-CM | POA: Diagnosis present

## 2015-06-27 DIAGNOSIS — Z791 Long term (current) use of non-steroidal anti-inflammatories (NSAID): Secondary | ICD-10-CM | POA: Insufficient documentation

## 2015-06-27 DIAGNOSIS — R300 Dysuria: Secondary | ICD-10-CM | POA: Diagnosis not present

## 2015-06-27 DIAGNOSIS — F329 Major depressive disorder, single episode, unspecified: Secondary | ICD-10-CM | POA: Diagnosis not present

## 2015-06-27 DIAGNOSIS — N3289 Other specified disorders of bladder: Secondary | ICD-10-CM

## 2015-06-27 DIAGNOSIS — E039 Hypothyroidism, unspecified: Secondary | ICD-10-CM | POA: Diagnosis not present

## 2015-06-27 DIAGNOSIS — R32 Unspecified urinary incontinence: Secondary | ICD-10-CM | POA: Insufficient documentation

## 2015-06-27 DIAGNOSIS — Z792 Long term (current) use of antibiotics: Secondary | ICD-10-CM | POA: Insufficient documentation

## 2015-06-27 DIAGNOSIS — Z79899 Other long term (current) drug therapy: Secondary | ICD-10-CM | POA: Insufficient documentation

## 2015-06-27 DIAGNOSIS — R351 Nocturia: Secondary | ICD-10-CM | POA: Diagnosis not present

## 2015-06-27 HISTORY — DX: Urgency of urination: R39.15

## 2015-06-27 HISTORY — PX: CYSTOSCOPY WITH HYDRODISTENSION AND BIOPSY: SHX5127

## 2015-06-27 HISTORY — DX: Personal history of other mental and behavioral disorders: Z86.59

## 2015-06-27 HISTORY — DX: Personal history of other diseases of the digestive system: Z87.19

## 2015-06-27 HISTORY — DX: Personal history of other diseases of the female genital tract: Z87.42

## 2015-06-27 HISTORY — DX: Other symptoms and signs involving the genitourinary system: R39.89

## 2015-06-27 LAB — POCT PREGNANCY, URINE: Preg Test, Ur: NEGATIVE

## 2015-06-27 LAB — POCT HEMOGLOBIN-HEMACUE: Hemoglobin: 12.8 g/dL (ref 12.0–15.0)

## 2015-06-27 SURGERY — CYSTOSCOPY, WITH BLADDER HYDRODISTENSION AND BIOPSY
Anesthesia: General | Site: Urethra

## 2015-06-27 MED ORDER — PHENAZOPYRIDINE HCL 200 MG PO TABS: 200.0000 mg | ORAL_TABLET | Freq: Three times a day (TID) | ORAL | Status: DC | PRN

## 2015-06-27 MED ORDER — TAMSULOSIN HCL 0.4 MG PO CAPS
ORAL_CAPSULE | ORAL | Status: AC
  Filled 2015-06-27: qty 1

## 2015-06-27 MED ORDER — STERILE WATER FOR IRRIGATION IR SOLN
Status: DC | PRN
  Administered 2015-06-27: 6000 mL

## 2015-06-27 MED ORDER — HYDROCODONE-ACETAMINOPHEN 10-325 MG PO TABS: 1.0000 | ORAL_TABLET | ORAL | Status: DC | PRN

## 2015-06-27 MED ORDER — OXYBUTYNIN CHLORIDE 5 MG PO TABS
ORAL_TABLET | ORAL | Status: AC
  Filled 2015-06-27: qty 1

## 2015-06-27 MED ORDER — TAMSULOSIN HCL 0.4 MG PO CAPS
0.4000 mg | ORAL_CAPSULE | Freq: Once | ORAL | Status: AC
  Administered 2015-06-27: 0.4 mg via ORAL
  Filled 2015-06-27: qty 1

## 2015-06-27 MED ORDER — MEPERIDINE HCL 25 MG/ML IJ SOLN
6.2500 mg | INTRAMUSCULAR | Status: DC | PRN
  Filled 2015-06-27: qty 1

## 2015-06-27 MED ORDER — PHENAZOPYRIDINE HCL 100 MG PO TABS
ORAL_TABLET | ORAL | Status: AC
  Filled 2015-06-27: qty 2

## 2015-06-27 MED ORDER — CIPROFLOXACIN IN D5W 200 MG/100ML IV SOLN
INTRAVENOUS | Status: AC
  Filled 2015-06-27: qty 100

## 2015-06-27 MED ORDER — DEXAMETHASONE SODIUM PHOSPHATE 4 MG/ML IJ SOLN
INTRAMUSCULAR | Status: DC | PRN
  Administered 2015-06-27: 8 mg via INTRAVENOUS

## 2015-06-27 MED ORDER — FENTANYL CITRATE (PF) 100 MCG/2ML IJ SOLN
INTRAMUSCULAR | Status: AC
  Filled 2015-06-27: qty 2

## 2015-06-27 MED ORDER — HYDROCODONE-ACETAMINOPHEN 10-325 MG PO TABS
1.0000 | ORAL_TABLET | ORAL | Status: DC | PRN
  Administered 2015-06-27: 1 via ORAL
  Filled 2015-06-27: qty 1

## 2015-06-27 MED ORDER — FENTANYL CITRATE (PF) 100 MCG/2ML IJ SOLN
25.0000 ug | INTRAMUSCULAR | Status: DC | PRN
  Administered 2015-06-27: 25 ug via INTRAVENOUS
  Filled 2015-06-27: qty 1

## 2015-06-27 MED ORDER — MIDAZOLAM HCL 2 MG/2ML IJ SOLN
0.5000 mg | Freq: Once | INTRAMUSCULAR | Status: DC | PRN
  Filled 2015-06-27: qty 2

## 2015-06-27 MED ORDER — CIPROFLOXACIN IN D5W 200 MG/100ML IV SOLN
200.0000 mg | INTRAVENOUS | Status: AC
  Administered 2015-06-27: 200 mg via INTRAVENOUS
  Filled 2015-06-27: qty 100

## 2015-06-27 MED ORDER — OXYBUTYNIN CHLORIDE 5 MG PO TABS
5.0000 mg | ORAL_TABLET | Freq: Once | ORAL | Status: AC
  Administered 2015-06-27: 5 mg via ORAL
  Filled 2015-06-27: qty 1

## 2015-06-27 MED ORDER — HYDROCODONE-ACETAMINOPHEN 10-325 MG PO TABS
ORAL_TABLET | ORAL | Status: AC
  Filled 2015-06-27: qty 1

## 2015-06-27 MED ORDER — LIDOCAINE HCL (CARDIAC) 20 MG/ML IV SOLN
INTRAVENOUS | Status: DC | PRN
  Administered 2015-06-27: 40 mg via INTRAVENOUS

## 2015-06-27 MED ORDER — PHENAZOPYRIDINE HCL 200 MG PO TABS
200.0000 mg | ORAL_TABLET | Freq: Once | ORAL | Status: AC
  Administered 2015-06-27: 200 mg via ORAL
  Filled 2015-06-27: qty 1

## 2015-06-27 MED ORDER — PROMETHAZINE HCL 25 MG/ML IJ SOLN
6.2500 mg | INTRAMUSCULAR | Status: DC | PRN
Start: 2015-06-27 — End: 2015-06-27
  Filled 2015-06-27: qty 1

## 2015-06-27 MED ORDER — ONDANSETRON HCL 4 MG/2ML IJ SOLN
INTRAMUSCULAR | Status: DC | PRN
Start: 2015-06-27 — End: 2015-06-27
  Administered 2015-06-27: 4 mg via INTRAVENOUS

## 2015-06-27 MED ORDER — MIDAZOLAM HCL 5 MG/5ML IJ SOLN
INTRAMUSCULAR | Status: DC | PRN
  Administered 2015-06-27: 1 mg via INTRAVENOUS

## 2015-06-27 MED ORDER — PHENAZOPYRIDINE HCL 200 MG PO TABS
ORAL | Status: DC | PRN
  Administered 2015-06-27: 15 mL via INTRAVESICAL

## 2015-06-27 MED ORDER — PROPOFOL 10 MG/ML IV BOLUS
INTRAVENOUS | Status: DC | PRN
  Administered 2015-06-27: 110 mg via INTRAVENOUS

## 2015-06-27 MED ORDER — MIDAZOLAM HCL 2 MG/2ML IJ SOLN
INTRAMUSCULAR | Status: AC
  Filled 2015-06-27: qty 2

## 2015-06-27 MED ORDER — FENTANYL CITRATE (PF) 100 MCG/2ML IJ SOLN
INTRAMUSCULAR | Status: DC | PRN
  Administered 2015-06-27: 50 ug via INTRAVENOUS

## 2015-06-27 MED ORDER — LACTATED RINGERS IV SOLN
INTRAVENOUS | Status: DC
  Administered 2015-06-27: 07:00:00 via INTRAVENOUS
  Filled 2015-06-27: qty 1000

## 2015-06-27 SURGICAL SUPPLY — 25 items
BAG DRAIN URO-CYSTO SKYTR STRL (DRAIN) ×3 IMPLANT
CANISTER SUCT LVC 12 LTR MEDI- (MISCELLANEOUS) IMPLANT
CATH FOLEY 2WAY SLVR  5CC 24FR (CATHETERS)
CATH FOLEY 2WAY SLVR 5CC 24FR (CATHETERS) IMPLANT
CATH ROBINSON RED A/P 16FR (CATHETERS) ×3 IMPLANT
CLOTH BEACON ORANGE TIMEOUT ST (SAFETY) ×3 IMPLANT
ELECT REM PT RETURN 9FT ADLT (ELECTROSURGICAL) ×3
ELECTRODE REM PT RTRN 9FT ADLT (ELECTROSURGICAL) ×2 IMPLANT
GLOVE BIO SURGEON STRL SZ 6.5 (GLOVE) ×3 IMPLANT
GLOVE BIO SURGEON STRL SZ8 (GLOVE) ×3 IMPLANT
GLOVE INDICATOR 6.5 STRL GRN (GLOVE) ×6 IMPLANT
GOWN STRL REUS W/ TWL LRG LVL3 (GOWN DISPOSABLE) ×2 IMPLANT
GOWN STRL REUS W/ TWL XL LVL3 (GOWN DISPOSABLE) ×2 IMPLANT
GOWN STRL REUS W/TWL LRG LVL3 (GOWN DISPOSABLE) ×1
GOWN STRL REUS W/TWL XL LVL3 (GOWN DISPOSABLE) ×1
IV NS IRRIG 3000ML ARTHROMATIC (IV SOLUTION) IMPLANT
MANIFOLD NEPTUNE II (INSTRUMENTS) ×3 IMPLANT
NDL SAFETY ECLIPSE 18X1.5 (NEEDLE) ×2 IMPLANT
NEEDLE HYPO 18GX1.5 SHARP (NEEDLE) ×1
NEEDLE HYPO 22GX1.5 SAFETY (NEEDLE) IMPLANT
NEEDLE SPNL 22GX7 QUINCKE BK (NEEDLE) IMPLANT
NS IRRIG 500ML POUR BTL (IV SOLUTION) IMPLANT
PACK CYSTO (CUSTOM PROCEDURE TRAY) ×3 IMPLANT
SYR 20CC LL (SYRINGE) ×3 IMPLANT
WATER STERILE IRR 3000ML UROMA (IV SOLUTION) ×6 IMPLANT

## 2015-06-27 NOTE — H&P (Signed)
Dawn Price is a 29 year old female with voiding symptoms suggestive of OAB.   History of Present Illness OAB: The patient has undergone extensive evaluation of persistent suprapubic pain. She has been seen in the ER and been treated with antibiotics for possible PID. Pelvic ultrasound as well as a CT scan done in 4/16 revealed no abnormality. The pain is in the suprapubic region but not associated with any dysuria. In the daytime she reported she does have some frequency. She has suprapubic discomfort does not occur every day but when it does occur she said it radiates into both of her legs. In addition she said over the past year she has had 4 UTIs that are associated with dysuria and cramping in area of the bladder. She does have some difficulty with constipation and gets some relief after having a bowel movement. She also reports that when her bladder fills she does not have pain but does experience increased pressure.  OAB symptoms score 24/40 with her primary symptoms being those of frequency and urgency.     Interval history: I felt she most likely had bladder overactivity rather than interstitial cystitis or pelvic floor muscle dysfunction so I placed her on a trial of Myrbetriq 25 mg and she did note a decrease in the frequency of urination. Despite this though she ended up having to go to the emergency room because of her episodic lower abdominal pain. There she was told that she should inquire about possible cystoscopic evaluation. We discussed the fact that she had a CT scan that did not reveal any gross intravesical lesions although this did not rule out subtle lesions of the mucosa. In addition she continues to report distention of the suprapubic region intermittently that she said she thinks does resolve with urination. She said her bowels are now regulated.   Past Medical History Problems  1. History of depression (Z86.59) 2. History of hypothyroidism (Z86.39)  Surgical  History Problems  1. History of No Surgical Problems  Current Meds 1. Augmentin 875-125 MG Oral Tablet;  Therapy: (Recorded:14Jun2016) to Recorded 2. MetroNIDAZOLE 500 MG Oral Tablet;  Therapy: (Recorded:14Jun2016) to Recorded 3. MiraLax Oral Powder;  Therapy: (Recorded:14Jun2016) to Recorded 4. Myrbetriq 25 MG Oral Tablet Extended Release 24 Hour; Take 1 tablet daily;  Therapy: 14Jun2016 to (Evaluate:28Jun2016); Last Rx:14Jun2016 Ordered 5. Naproxen 375 MG Oral Tablet;  Therapy: (Recorded:14Jun2016) to Recorded 6. Vitamin D 62130 UNIT CAPS;  Therapy: (Recorded:14Jun2016) to Recorded  Allergies Medication  1. Doxycycline Hyclate CAPS Non-Medication  2. Almonds 3. Peanuts  Family History Problems  1. No pertinent family history : Mother  Social History Problems  1. Alcohol use (Z78.9) 2. Caffeine use (F15.90) 3. Never a smoker     Vitals Vital Signs   Height: 5 ft  Weight: 87 lb  BMI Calculated: 16.99 BSA Calculated: 1.31 Blood Pressure: 82 / 53 Heart Rate: 88  Review of Systems Genitourinary, constitutional, skin, eye, otolaryngeal, hematologic/lymphatic, cardiovascular, pulmonary, endocrine, musculoskeletal, gastrointestinal, neurological and psychiatric system(s) were reviewed and pertinent findings if present are noted and are otherwise negative.  Genitourinary: urinary frequency, urinary urgency, dysuria, nocturia, incontinence, urinary hesitancy and suprapubic pain.  Gastrointestinal: nausea and constipation.  Constitutional: fever, feeling tired (fatigue) and recent weight loss.  Musculoskeletal: back pain.  Neurological: dizziness.  Psychiatric: depression and anxiety.     Physical Exam Constitutional: Well nourished and well developed . No acute distress.  ENT:. The ears and nose are normal in appearance.  Neck: The appearance of the neck  is normal and no neck mass is present.  Pulmonary: No respiratory distress and normal respiratory rhythm and  effort.  Cardiovascular: Heart rate and rhythm are normal . No peripheral edema.  Abdomen: The abdomen is soft and nontender. No masses are palpated. No CVA tenderness. No hernias are palpable. No hepatosplenomegaly noted.  Genitourinary:  Chaperone Present: .  Examination of the external genitalia shows normal female external genitalia and no lesions. The urethra is normal in appearance and not tender. There is no urethral mass. Vaginal exam demonstrates no abnormalities. The adnexa are palpably normal. The bladder is non tender and not distended. The anus is normal on inspection. The perineum is normal on inspection.  Lymphatics: The femoral and inguinal nodes are not enlarged or tender.  Skin: Normal skin turgor, no visible rash and no visible skin lesions.  Neuro/Psych:. Mood and affect are appropriate.  Results/Data Urine  COLOR YELLOW  APPEARANCE CLEAR  SPECIFIC GRAVITY 1.025  pH 6.0  GLUCOSE NEGATIVE  BILIRUBIN NEGATIVE  KETONE NEGATIVE  BLOOD NEGATIVE  PROTEIN NEGATIVE  NITRITE NEGATIVE  LEUKOCYTE ESTERASE NEGATIVE   The following clinical lab reports were reviewed:  UA: Clear today.    Assessment   At this point there has been a question raised as to whether she may have interstitial cystitis and although her symptoms don't seem to suggest classic IC it has not been ruled out and we discussed the fact that it is a diagnosis of exclusion. I told her I was also curious as to find out whether the lower abdominal bloating that she experiences that seems to resolve with urination is in fact her bladder. She is so small that I think that this is certainly possible but I will be able to assess this better with her bladder filled and because she did not tolerate my attempt at cystoscopy today I will perform this under anesthesia. We have discussed the fact that she has identified acid-containing beverages as irritating to her bladder so I have given her an interstitial cystitis diet  sheet. In addition I'm going to have her try Myrbetriq out of 50 mg dose and gave her samples of that today. I then went over the planned procedure of cystoscopy with hydrodistention and bladder biopsy. I discussed the procedure with her in detail and how it can be both diagnostic and therapeutic. We discussed the probability of success, the outpatient nature of the procedure as well as the anticipated postoperative course. She understands and has elected to proceed.   Plan   1. IC diet sheet.  2. Myrbetriq 50 mg.  3. She will be scheduled for cystoscopy, hydrodistention and bladder biopsy.

## 2015-06-27 NOTE — Transfer of Care (Signed)
Immediate Anesthesia Transfer of Care Note  Patient: Dawn Price  Procedure(s) Performed: Procedure(s) (LRB): CYSTOSCOPY/BLADDER BIOPSY/HYDRODISTENSION AND  INSTILLATION OF MARCAINE AND PYRIDIUM (N/A)  Patient Location: PACU  Anesthesia Type: General  Level of Consciousness: awake, oriented, sedated and patient cooperative  Airway & Oxygen Therapy: Patient Spontanous Breathing and Patient connected to face mask oxygen  Post-op Assessment: Report given to PACU RN and Post -op Vital signs reviewed and stable  Post vital signs: Reviewed and stable  Complications: No apparent anesthesia complications

## 2015-06-27 NOTE — Anesthesia Preprocedure Evaluation (Addendum)
Anesthesia Evaluation  Patient identified by MRN, date of birth, ID band Patient awake    Reviewed: Allergy & Precautions, NPO status , Patient's Chart, lab work & pertinent test results  History of Anesthesia Complications Negative for: history of anesthetic complications  Airway Mallampati: II  TM Distance: >3 FB Neck ROM: Full    Dental  (+) Dental Advisory Given   Pulmonary neg pulmonary ROS,  breath sounds clear to auscultation        Cardiovascular negative cardio ROS  Rhythm:Regular Rate:Normal     Neuro/Psych Anxiety Depression negative neurological ROS     GI/Hepatic Neg liver ROS, GERD-  Controlled,H/o gastritis   Endo/Other  negative endocrine ROS  Renal/GU negative Renal ROS     Musculoskeletal   Abdominal   Peds  Hematology negative hematology ROS (+)   Anesthesia Other Findings   Reproductive/Obstetrics LMP 5d ago, urine preg NEG today                            Anesthesia Physical Anesthesia Plan  ASA: II  Anesthesia Plan: General   Post-op Pain Management:    Induction: Intravenous  Airway Management Planned: LMA  Additional Equipment:   Intra-op Plan:   Post-operative Plan:   Informed Consent: I have reviewed the patients History and Physical, chart, labs and discussed the procedure including the risks, benefits and alternatives for the proposed anesthesia with the patient or authorized representative who has indicated his/her understanding and acceptance.   Dental advisory given  Plan Discussed with: CRNA and Surgeon  Anesthesia Plan Comments: (Plan routine monitors, GA)        Anesthesia Quick Evaluation

## 2015-06-27 NOTE — Anesthesia Procedure Notes (Signed)
Procedure Name: LMA Insertion Date/Time: 06/27/2015 7:35 AM Performed by: Renella Cunas D Pre-anesthesia Checklist: Patient identified, Emergency Drugs available, Suction available and Patient being monitored Patient Re-evaluated:Patient Re-evaluated prior to inductionOxygen Delivery Method: Circle System Utilized Preoxygenation: Pre-oxygenation with 100% oxygen Intubation Type: IV induction Ventilation: Mask ventilation without difficulty LMA: LMA inserted LMA Size: 3.0 Number of attempts: 1 Airway Equipment and Method: Bite block Placement Confirmation: positive ETCO2 Tube secured with: Tape Dental Injury: Teeth and Oropharynx as per pre-operative assessment

## 2015-06-27 NOTE — Progress Notes (Signed)
Taxi  Went to the wrong area had to wait for taxi to leave

## 2015-06-27 NOTE — Op Note (Signed)
PATIENT:  Dawn Price  PRE-OPERATIVE DIAGNOSIS: Lower urinary tract symptoms  POST-OPERATIVE DIAGNOSIS: 1. Lower urinary tract symptoms 2. Urethral stenosis 3. Possible early IC  PROCEDURE: 1. Cystoscopy with hydrodistention 2. Bladder biopsies 3. Urethral dilation  SURGEON:  [place]  INDICATION: Dawn Price is a 29 year old female with intermittent lower abdominal pain, bloating and dysuria that did not respond to her bed trichomoniasis had a 25 or 50 mg dose. She continues to have lower abdominal pain and she also described distention of the lower abdomen. She is brought to the operating room today for further evaluation under anesthesia.  ANESTHESIA:  General  EBL:  Minimal  DRAINS: None  LOCAL MEDICATIONS USED:  15 mL of 0.5% Marcaine and 400 mg of Pyridium crushed in solution instilled in the bladder at the end of the procedure.  SPECIMEN:  Bladder biopsies from the right wall, left wall and posterior wall  Description of procedure: After informed consent the patient was taken to the operating room and placed on the table in a supine position. General anesthesia was then administered. Once fully anesthetized the patient was moved to the dorsal lithotomy position and the genitalia were sterilely prepped and draped in standard fashion. An official timeout was then performed.  Initial evaluation revealed the urethral meatus appeared quite small. I was able to negotiate a 34 [place] female sound through the urethra and then performed urethral dilation up to 65 [place] without bleeding occurring.  The 23 French cystoscope with 30 lens was then advanced into the bladder and the urethra was noted to have no evidence of scarring or significant abnormality. Full and systematic inspection of the bladder revealed no tumors, stones or inflammatory lesions with both ureteral orifices being of normal configuration and position. I then filled the bladder to capacity and noted  mild, fine trabeculation. When I emptied the bladder that was filled to 80 cm H2O I found it held 700 mL. The terminal effluent was clear.  Reinspection of the bladder after initial hydrodistention revealed very mild trabeculation on the posterior wall. I then filled her initially to capacity and observed the suprapubic region. With the bladder filled to its capacity I did note some mild distention of the lower abdomen although it appeared minimal and when I drained the bladder I found there was 800 mL in the bladder. I therefore filled the bladder to what I felt was a more normal capacity of approximately 400 mL and see no discernible distention of the lower abdomen with the bladder filled to this volume.  I then inserted the Tolbert biopsy forceps and obtained biopsies from the posterior wall, right wall and left wall. I then used the Bugbee electrode to fulgurate the biopsy sites. The bladder was then drained, the peridium and Marcaine solution instilled using a 37 [place] female sound and the patient was awakened and taken to the recovery room in stable and satisfactory condition. She tolerated the procedure well with no intraoperative complications.  PLAN OF CARE: Discharge to home after PACU  PATIENT DISPOSITION:  PACU - hemodynamically stable.

## 2015-06-27 NOTE — Anesthesia Postprocedure Evaluation (Signed)
  Anesthesia Post-op Note  Patient: Dawn Price  Procedure(s) Performed: Procedure(s): CYSTOSCOPY/BLADDER BIOPSY/HYDRODISTENSION AND  INSTILLATION OF MARCAINE AND PYRIDIUM (N/A) URETHRA DILATATION (N/A)  Patient Location: PACU  Anesthesia Type:General  Level of Consciousness: awake, alert , oriented and patient cooperative  Airway and Oxygen Therapy: Patient Spontanous Breathing  Post-op Pain: none  Post-op Assessment: Post-op Vital signs reviewed, Patient's Cardiovascular Status Stable, Respiratory Function Stable, Patent Airway, No signs of Nausea or vomiting, Adequate PO intake and Pain level controlled              Post-op Vital Signs: Reviewed and stable  Last Vitals:  Filed Vitals:   06/27/15 0830  BP: 107/68  Pulse: 71  Temp:   Resp: 16    Complications: No apparent anesthesia complications

## 2015-06-27 NOTE — Discharge Instructions (Signed)

## 2015-07-01 ENCOUNTER — Encounter (HOSPITAL_BASED_OUTPATIENT_CLINIC_OR_DEPARTMENT_OTHER): Payer: Self-pay | Admitting: Urology

## 2015-07-03 ENCOUNTER — Encounter (HOSPITAL_COMMUNITY): Payer: Self-pay | Admitting: *Deleted

## 2015-07-03 ENCOUNTER — Emergency Department (HOSPITAL_COMMUNITY)
Admission: EM | Admit: 2015-07-03 | Discharge: 2015-07-03 | Disposition: A | Discharge status: Home / Self Care | Payer: No Typology Code available for payment source | Attending: Emergency Medicine | Admitting: Emergency Medicine

## 2015-07-03 DIAGNOSIS — G8929 Other chronic pain: Secondary | ICD-10-CM | POA: Insufficient documentation

## 2015-07-03 DIAGNOSIS — Z8659 Personal history of other mental and behavioral disorders: Secondary | ICD-10-CM | POA: Diagnosis not present

## 2015-07-03 DIAGNOSIS — G8918 Other acute postprocedural pain: Secondary | ICD-10-CM | POA: Diagnosis present

## 2015-07-03 DIAGNOSIS — R300 Dysuria: Secondary | ICD-10-CM | POA: Insufficient documentation

## 2015-07-03 DIAGNOSIS — R102 Pelvic and perineal pain: Secondary | ICD-10-CM | POA: Insufficient documentation

## 2015-07-03 DIAGNOSIS — Z8719 Personal history of other diseases of the digestive system: Secondary | ICD-10-CM | POA: Insufficient documentation

## 2015-07-03 DIAGNOSIS — R1030 Lower abdominal pain, unspecified: Secondary | ICD-10-CM | POA: Insufficient documentation

## 2015-07-03 DIAGNOSIS — Z8742 Personal history of other diseases of the female genital tract: Secondary | ICD-10-CM | POA: Diagnosis not present

## 2015-07-03 LAB — URINALYSIS, ROUTINE W REFLEX MICROSCOPIC
Bilirubin Urine: NEGATIVE
Glucose, UA: NEGATIVE mg/dL
Ketones, ur: NEGATIVE mg/dL
Nitrite: NEGATIVE
PROTEIN: NEGATIVE mg/dL
SPECIFIC GRAVITY, URINE: 1.025 (ref 1.005–1.030)
UROBILINOGEN UA: 0.2 mg/dL (ref 0.0–1.0)
pH: 5.5 (ref 5.0–8.0)

## 2015-07-03 LAB — COMPREHENSIVE METABOLIC PANEL
ALBUMIN: 3.8 g/dL (ref 3.5–5.0)
ALT: 99 U/L — ABNORMAL HIGH (ref 14–54)
ANION GAP: 5 (ref 5–15)
AST: 53 U/L — ABNORMAL HIGH (ref 15–41)
Alkaline Phosphatase: 74 U/L (ref 38–126)
BUN: 14 mg/dL (ref 6–20)
CO2: 26 mmol/L (ref 22–32)
Calcium: 9.3 mg/dL (ref 8.9–10.3)
Chloride: 106 mmol/L (ref 101–111)
Creatinine, Ser: 0.61 mg/dL (ref 0.44–1.00)
GFR calc non Af Amer: >60 mL/min (ref >60)
GLUCOSE: 89 mg/dL (ref 65–99)
POTASSIUM: 4.5 mmol/L (ref 3.5–5.1)
SODIUM: 137 mmol/L (ref 135–145)
TOTAL PROTEIN: 6.7 g/dL (ref 6.5–8.1)
Total Bilirubin: 0.9 mg/dL (ref 0.3–1.2)

## 2015-07-03 LAB — URINE MICROSCOPIC-ADD ON

## 2015-07-03 LAB — WET PREP, GENITAL
Clue Cells Wet Prep HPF POC: NONE SEEN
Trich, Wet Prep: NONE SEEN
YEAST WET PREP: NONE SEEN

## 2015-07-03 LAB — CBC
HEMATOCRIT: 38.3 % (ref 36.0–46.0)
HEMOGLOBIN: 12.7 g/dL (ref 12.0–15.0)
MCH: 29.9 pg (ref 26.0–34.0)
MCHC: 33.2 g/dL (ref 30.0–36.0)
MCV: 90.1 fL (ref 78.0–100.0)
Platelets: 276 10*3/uL (ref 150–400)
RBC: 4.25 MIL/uL (ref 3.87–5.11)
RDW: 12 % (ref 11.5–15.5)
WBC: 9.4 10*3/uL (ref 4.0–10.5)

## 2015-07-03 LAB — I-STAT BETA HCG BLOOD, ED (MC, WL, AP ONLY)

## 2015-07-03 LAB — LIPASE, BLOOD: Lipase: 27 U/L (ref 22–51)

## 2015-07-03 MED ORDER — HYDROCODONE-ACETAMINOPHEN 5-325 MG PO TABS
1.0000 | ORAL_TABLET | Freq: Once | ORAL | Status: AC
  Administered 2015-07-03: 1 via ORAL
  Filled 2015-07-03: qty 1

## 2015-07-03 MED ORDER — CEPHALEXIN 500 MG PO CAPS: 500.0000 mg | ORAL_CAPSULE | Freq: Two times a day (BID) | ORAL | Status: DC

## 2015-07-03 NOTE — ED Notes (Signed)
Pt's family came out requested updating.  MD made aware, states he will update family shortly.

## 2015-07-03 NOTE — Discharge Instructions (Signed)
All the results in the ER are normal, labs and imaging. We are not sure what is causing your symptoms. The workup in the ER is not complete, and is limited to screening for life threatening and emergent conditions only, so please see a primary care doctor for further evaluation.  Take the meds prescribed. See the [facility] doctor and Urologist as planned. PCP in 1 week.   Dysuria Dysuria is the medical term for pain with urination. There are many causes for dysuria, but urinary tract infection is the most common. If a urinalysis was performed it can show that there is a urinary tract infection. A urine culture confirms that you or your child is sick. You will need to follow up with a healthcare provider because:  If a urine culture was done you will need to know the culture results and treatment recommendations.  If the urine culture was positive, you or your child will need to be put on antibiotics or know if the antibiotics prescribed are the right antibiotics for your urinary tract infection.  If the urine culture is negative (no urinary tract infection), then other causes may need to be explored or antibiotics need to be stopped. Today laboratory work may have been done and there does not seem to be an infection. If cultures were done they will take at least 24 to 48 hours to be completed. Today x-rays may have been taken and they read as normal. No cause can be found for the problems. The x-rays may be re-read by a radiologist and you will be contacted if additional findings are made. You or your child may have been put on medications to help with this problem until you can see your primary caregiver. If the problems get better, see your primary caregiver if the problems return. If you were given antibiotics (medications which kill germs), take all of the mediations as directed for the full course of treatment.  If laboratory work was done, you need to find the results. Leave a telephone number  where you can be reached. If this is not possible, make sure you find out how you are to get test results. HOME CARE INSTRUCTIONS   Drink lots of fluids. For adults, drink eight, 8 ounce glasses of clear juice or water a day. For children, replace fluids as suggested by your caregiver.  Empty the bladder often. Avoid holding urine for long periods of time.  After a bowel movement, women should cleanse front to back, using each tissue only once.  Empty your bladder before and after sexual intercourse.  Take all the medicine given to you until it is gone. You may feel better in a few days, but TAKE ALL MEDICINE.  Avoid caffeine, tea, alcohol and carbonated beverages, because they tend to irritate the bladder.  In men, alcohol may irritate the prostate.  Only take over-the-counter or prescription medicines for pain, discomfort, or fever as directed by your caregiver.  If your caregiver has given you a follow-up appointment, it is very important to keep that appointment. Not keeping the appointment could result in a chronic or permanent injury, pain, and disability. If there is any problem keeping the appointment, you must call back to this facility for assistance. SEEK IMMEDIATE MEDICAL CARE IF:   Back pain develops.  A fever develops.  There is nausea (feeling sick to your stomach) or vomiting (throwing up).  Problems are no better with medications or are getting worse. MAKE SURE YOU:   Understand these  instructions.  Will watch your condition.  Will get help right away if you are not doing well or get worse. Document Released: 07/09/2004 Document Revised: 01/03/2012 Document Reviewed: 05/16/2008 [facility] Patient Information 2015 [address] [place], [place]. This information is not intended to replace advice given to you by your health care provider. Make sure you discuss any questions you have with your health care provider.

## 2015-07-03 NOTE — ED Notes (Signed)
Pt able to dress independently. 

## 2015-07-03 NOTE — ED Notes (Signed)
Pt sts she had a CT and [place] in April at [facility] and everything was normal (including an ovarian cyst pt states was "no longer present").  Pt sts swelling in her lower abdomen began after having her CT.

## 2015-07-03 NOTE — ED Provider Notes (Addendum)
CSN: [id number]     Arrival date & time 07/03/15  1702 History   First MD Initiated Contact with Patient 07/03/15 1816     Chief Complaint  Patient presents with  . Post-op Problem     (Consider location/radiation/quality/duration/timing/severity/associated sxs/prior Treatment) HPI Comments: Pt comes in with cc of pelvic pain. Pt has had abdominal pain in the lower quadrants for several months now. She has had CT scans x 2, US pelvis and gyne + urology evaluation. She is s/p cystoscopy, pod #6 today. Reports that since the cystoscopy, she has been having lower quadrant abd pain and dysuria, hematuria. All of those symptoms are improving overtime, but she developed fevers and chills 2 days ago. T max 101 at home, last advil was 10 am. She doesn't have shaking chills, but she is feeling cold. She saw the urologist yday, and was informed that her WC was elevated, and the Urine was clear. No current vaginal discharge, bleeding. Records indicate PID in the past, but she has had 2 pelvic exams in the last few months, both neg for STD and pt denies any risk for STD at this time.   ROS 10 Systems reviewed and are negative for acute change except as noted in the HPI.     The history is provided by the patient.    Past Medical History  Diagnosis Date  . Anxiety   . Hemorrhoids   . Bladder pain   . History of pelvic inflammatory disease   . History of gastritis     mild per EGD 03-26-2015  . Major depressive disorder   . History of panic attacks   . Urgency of urination    Past Surgical History  Procedure Laterality Date  . Esophagogastroduodenoscopy  03-26-2015  . Cystoscopy with hydrodistension and biopsy N/A 06/27/2015    Procedure: CYSTOSCOPY/BLADDER BIOPSY/HYDRODISTENSION AND  INSTILLATION OF MARCAINE AND PYRIDIUM;  Surgeon: Ihor Gully, MD;  Location: [facility];  Service: Urology;  Laterality: N/A;   Family History  Problem Relation Age of Onset  . Colon cancer  Neg Hx   . Colon polyps Neg Hx   . Diabetes Neg Hx   . Esophageal cancer Neg Hx   . Rectal cancer Neg Hx   . Stomach cancer Neg Hx   . Kidney disease Maternal Aunt   . Heart disease Maternal Aunt   . Gallbladder disease Maternal Aunt    Social History  Substance Use Topics  . Smoking status: Never Smoker   . Smokeless tobacco: Never Used  . Alcohol Use: No   OB History    No data available     Review of Systems  All other systems reviewed and are negative.     Allergies  Almond oil; Doxycycline; and Peanuts  Home Medications   Prior to Admission medications   Medication Sig Start Date End Date Taking? Authorizing Provider  HYDROcodone-acetaminophen (NORCO) 10-325 MG per tablet Take 1-2 tablets by mouth every 4 (four) hours as needed for moderate pain. Maximum dose per 24 hours - 8 pills 06/27/15  Yes Ihor Gully, MD  ibuprofen (ADVIL,MOTRIN) 200 MG tablet Take 200 mg by mouth every 6 (six) hours as needed for fever.   Yes Historical Provider, MD  phenazopyridine (PYRIDIUM) 200 MG tablet Take 1 tablet (200 mg total) by mouth 3 (three) times daily as needed for pain. Patient taking differently: Take 200 mg by mouth 3 (three) times daily as needed for pain. Take every day per patient 06/27/15  Yes Ihor Gully, MD  cephALEXin (KEFLEX) 500 MG capsule Take 1 capsule (500 mg total) by mouth 2 (two) times daily. 07/03/15   Lior Cartelli, MD   BP 110/56 mmHg  Pulse 93  Temp(Src) 98.2 F (36.8 C) (Oral)  Resp 16  Ht 5' (1.524 m)  Wt 85 lb (38.556 kg)  BMI 16.60 kg/m2  SpO2 100% Physical Exam  Constitutional: She is oriented to person, place, and time. She appears well-developed and well-nourished.  HENT:  Head: Normocephalic and atraumatic.  Eyes: Conjunctivae and EOM are normal. Pupils are equal, round, and reactive to light.  Neck: Normal range of motion. Neck supple.  Cardiovascular: Normal rate, regular rhythm, normal heart sounds and intact distal pulses.   No murmur  heard. Pulmonary/Chest: Effort normal. No respiratory distress. She has no wheezes.  Abdominal: Soft. Bowel sounds are normal. She exhibits no distension. There is tenderness. There is no rebound and no guarding.  Suprapubic tenderness  Genitourinary: Vagina normal and uterus normal.  External exam - normal, no lesions Speculum exam: Pt has some white discharge, no blood Bimanual exam: Patient has no CMT, no adnexal tenderness or fullness and cervical os is closed  Neurological: She is alert and oriented to person, place, and time.  Skin: Skin is warm and dry.  Nursing note and vitals reviewed.   ED Course  Procedures (including critical care time) Labs Review Labs Reviewed  WET PREP, GENITAL - Abnormal; Notable for the following:    WBC, Wet Prep HPF POC FEW (*)    All other components within normal limits  COMPREHENSIVE METABOLIC PANEL - Abnormal; Notable for the following:    AST 53 (*)    ALT 99 (*)    All other components within normal limits  URINALYSIS, ROUTINE W REFLEX MICROSCOPIC (NOT AT [facility]) - Abnormal; Notable for the following:    Color, Urine AMBER (*)    APPearance HAZY (*)    Hgb urine dipstick MODERATE (*)    Leukocytes, UA TRACE (*)    All other components within normal limits  URINE CULTURE  LIPASE, BLOOD  CBC  URINE MICROSCOPIC-ADD ON  I-STAT BETA HCG BLOOD, ED ([facility], [facility], [facility] ONLY)  GC/CHLAMYDIA PROBE AMP ([place]) NOT AT [facility]    Imaging Review No results found. I have personally reviewed and evaluated these images and lab results as part of my medical decision-making.   EKG Interpretation None      MDM   Final diagnoses:  Chronic pelvic pain in female  Dysuria    Pt with some pelvic discomfort, fevers, dysuria, she is s/p cystoscopy 6 days ago. She also has some vaginal itching. Her lab work today is normal and reassuring. Her pelvic exam is non peritoneal. Pt has 0 SIRs criteria. Will discuss awaiting cultures vs. Empirically start  cystitis tx with the fevers, uti like sx she has.   Derwood Kaplan, MD 07/03/15  2258  10:59 PM Pt's pelvic exam is benign. Will advise PCP f/u With her dysuria, fevers, recent instrumentation - we discussed the [place] and the sensitivity. Pt rather get started on antibiotics and f.u.  Derwood Kaplan, MD 07/03/15 2348

## 2015-07-03 NOTE — ED Notes (Signed)
States that she was at [facility] yesterday and had blood work drawn and she had an elevated WBC.

## 2015-07-03 NOTE — ED Notes (Signed)
Pt also c/o vaginal itching that started yesterday. Used vagisil yesterday.

## 2015-07-03 NOTE — ED Notes (Signed)
Pt states that she had a cystoscopy on Friday due to inflammation/swelling that is suprapubic after eating or drinking. Urology thought that she had cystitis but the surgery was clear. States that pt is still having the same symptoms since surgery. Pt c/o fever, dizziness, low appetite with N/V.

## 2015-07-04 LAB — GC/CHLAMYDIA PROBE AMP (~~LOC~~) NOT AT ARMC
Chlamydia: NEGATIVE
Neisseria Gonorrhea: NEGATIVE

## 2015-07-04 LAB — URINE CULTURE: SPECIAL REQUESTS: NORMAL

## 2015-07-17 ENCOUNTER — Telehealth: Payer: Self-pay | Admitting: Family Medicine

## 2015-07-17 NOTE — Telephone Encounter (Signed)
This patient has seen Dr. Katrinka Blazing many times before for recurrent pelvic pain. She recently had surgery and needs to come in for hospital/surgical follow up. Can she be worked in?

## 2015-07-18 NOTE — Telephone Encounter (Signed)
LMVM for patient to CB to schedule appt with Dr. Katrinka Blazing.

## 2015-07-21 ENCOUNTER — Ambulatory Visit (INDEPENDENT_AMBULATORY_CARE_PROVIDER_SITE_OTHER): Payer: No Typology Code available for payment source | Admitting: Family Medicine

## 2015-07-21 VITALS — BP 93/61 | HR 96 | Temp 97.8°F | Resp 17 | Ht 60.0 in | Wt 84.2 lb

## 2015-07-21 DIAGNOSIS — Z23 Encounter for immunization: Secondary | ICD-10-CM | POA: Diagnosis not present

## 2015-07-21 DIAGNOSIS — R1030 Lower abdominal pain, unspecified: Secondary | ICD-10-CM | POA: Diagnosis not present

## 2015-07-21 DIAGNOSIS — M791 Myalgia, unspecified site: Secondary | ICD-10-CM

## 2015-07-21 DIAGNOSIS — M79604 Pain in right leg: Secondary | ICD-10-CM | POA: Diagnosis not present

## 2015-07-21 DIAGNOSIS — K589 Irritable bowel syndrome without diarrhea: Secondary | ICD-10-CM

## 2015-07-21 DIAGNOSIS — R140 Abdominal distension (gaseous): Secondary | ICD-10-CM | POA: Diagnosis not present

## 2015-07-21 DIAGNOSIS — M79601 Pain in right arm: Secondary | ICD-10-CM | POA: Diagnosis not present

## 2015-07-21 MED ORDER — DICYCLOMINE HCL 10 MG PO CAPS: ORAL_CAPSULE | ORAL | Status: DC

## 2015-07-21 NOTE — Progress Notes (Signed)
Patient ID: Dawn Price, female    DOB: [birth year]  Age: 29 y.o. MRN: [id number]  Chief Complaint  Patient presents with  . Shoulder Pain    C/O right arm & shoulder pain x 5 days  . Back Pain    started yesterday on left side  . Generalized Body Aches    started today  . Flu Vaccine    Subjective:   Patient is here for several things. She's been having pain in her right shoulder and arm for 5 days. Knows of no injury. It just aches or from the right trapezius area down into the shoulder. It hurts down in the right arm a deep aching. She has had some pain on the left side of her back. She continues to have low abdominal pain. She has generalized body aches. She needs a flu shot. She has not had any nausea or vomiting or fever. She is scheduled to see the gastroenterologist in about 3 weeks. She's been worked up extensively since I saw her in February. She's had several ultrasounds of her abdomen and a couple of CT scans as well as an upper endoscopy. Her bowels move regularly. She does take some MiraLAX. She's has had a lot of gas. No burning and irritation. Her weight is down, especially compared to last year apparently. Her mother's been here from [place] visiting her for the past several months. Mother is worried about her a lot. 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 Her degree of February.  Current allergies, medications, problem list, past/family and social histories reviewed.  Objective:  BP 93/61 mmHg  Pulse 96  Temp(Src) 97.8 F (36.6 C) (Oral)  Resp 17  Ht 5' (1.524 m)  Wt 84 lb 4 oz (38.216 kg)  BMI 16.45 kg/m2  SpO2 96%  LMP 07/17/2015 (Exact Date)  No major acute distress. Is tender in the right side of her neck and right upper arm. No bruising. Good range of motion of the neck. Good strength and range of motion of the arm. Her chest is clear. Heart  regular without murmurs. Abdomen soft without masses. Is tender across lower abdomen. Can feel the sigmoid region not a Doppler little bit. Pelvic not done. Extremities without edema. She says her muscles just ache.  Assessment & Plan:   Assessment: 1. Arm pain, musculoskeletal, right   2. Myalgia   3. Lower abdominal pain   4. Abdominal bloating   5. Needs flu shot   6. Irritable bowel syndrome       Plan: We will try and recheck a few labs on her. Try some Bentyl on her. It may cost more she is to not continue it if it does. He needs to see a gastroenterologist and get a colonoscopy. We will try to tell little sooner.  Orders Placed This Encounter  Procedures  . Flu Vaccine QUAD 36+ mos IM  . CBC  . Sedimentation rate  . CK  . C-reactive protein  . ANA  . Ambulatory referral to Gastroenterology    Referral Priority:  Routine    Referral Type:  Consultation    Referral Reason:  Specialty Services Required    Number of Visits Requested:  1    Meds ordered this encounter  Medications  . dicyclomine (BENTYL) 10 MG capsule    Sig: Take one twice daily for bowels  Dispense:  60 capsule    Refill:  1     Patient Instructions  Take the dicyclomine one twice daily at breakfast and supper  Referral is being made back to Dr. Christella Hartigan to try and get you seen sooner for a colonoscopy  Plan to return in about 3 weeks if you have hard he had the colonoscopy, otherwise return after the first week of November.  Return sooner if needed  Aleve 2 pills twice daily as needed for arm    Return in about 3 weeks (around 08/11/2015).   HOPPER,DAVID, MD 07/21/2015

## 2015-07-21 NOTE — Patient Instructions (Addendum)
Take the dicyclomine one twice daily at breakfast and supper  Referral is being made back to Dr. Christella Hartigan to try and get you seen sooner for a colonoscopy  Plan to return in about 3 weeks if you have hard he had the colonoscopy, otherwise return after the first week of November.  Return sooner if needed  Aleve 2 pills twice daily as needed for arm  Try Mirafiber in place of MiraLAX.

## 2015-07-22 ENCOUNTER — Telehealth: Payer: Self-pay | Admitting: Gastroenterology

## 2015-07-22 LAB — CBC
HEMATOCRIT: 39.6 % (ref 36.0–46.0)
HEMOGLOBIN: 13.4 g/dL (ref 12.0–15.0)
MCH: 30.2 pg (ref 26.0–34.0)
MCHC: 33.8 g/dL (ref 30.0–36.0)
MCV: 89.2 fL (ref 78.0–100.0)
MPV: 8.5 fL — ABNORMAL LOW (ref 8.6–12.4)
Platelets: 350 10*3/uL (ref 150–400)
RBC: 4.44 MIL/uL (ref 3.87–5.11)
RDW: 12.9 % (ref 11.5–15.5)
WBC: 5.6 10*3/uL (ref 4.0–10.5)

## 2015-07-22 LAB — SEDIMENTATION RATE: SED RATE: 1 mm/h (ref 0–20)

## 2015-07-22 LAB — C-REACTIVE PROTEIN: CRP: <0.5 mg/dL (ref <0.60)

## 2015-07-22 LAB — CK: CK TOTAL: 37 U/L (ref 7–177)

## 2015-07-22 NOTE — Telephone Encounter (Signed)
9/27 pt's PCP wants pt seen sooner.  Ok to schedule with APP per Patty.  Left message for call back to reschedule.

## 2015-07-23 LAB — ANA: ANA: NEGATIVE

## 2015-07-24 ENCOUNTER — Encounter: Payer: Self-pay | Admitting: Family Medicine

## 2015-08-04 ENCOUNTER — Encounter (HOSPITAL_COMMUNITY): Payer: Self-pay | Admitting: *Deleted

## 2015-08-04 ENCOUNTER — Emergency Department (INDEPENDENT_AMBULATORY_CARE_PROVIDER_SITE_OTHER)
Admission: EM | Admit: 2015-08-04 | Discharge: 2015-08-04 | Disposition: A | Discharge status: Home / Self Care | Payer: No Typology Code available for payment source | Source: Home / Self Care | Attending: Family Medicine | Admitting: Family Medicine

## 2015-08-04 DIAGNOSIS — F419 Anxiety disorder, unspecified: Secondary | ICD-10-CM

## 2015-08-04 DIAGNOSIS — F430 Acute stress reaction: Principal | ICD-10-CM

## 2015-08-04 DIAGNOSIS — F411 Generalized anxiety disorder: Secondary | ICD-10-CM

## 2015-08-04 MED ORDER — ZOLPIDEM TARTRATE 10 MG PO TABS: 10.0000 mg | ORAL_TABLET | Freq: Every evening | ORAL | Status: DC | PRN

## 2015-08-04 MED ORDER — LORAZEPAM 1 MG PO TABS: 1.0000 mg | ORAL_TABLET | Freq: Two times a day (BID) | ORAL | Status: DC

## 2015-08-04 NOTE — ED Provider Notes (Signed)
CSN: [id number]     Arrival date & time 08/04/15  1404 History   First MD Initiated Contact with Patient 08/04/15 1551     Chief Complaint  Patient presents with  . Anxiety   (Consider location/radiation/quality/duration/timing/severity/associated sxs/prior Treatment) Patient is a 29 y.o. female presenting with anxiety. The history is provided by the patient and a parent.  Anxiety This is a recurrent problem. The current episode started 2 days ago. The problem has been gradually worsening. Associated symptoms comments: Not sleeping, can't concentrate, no time for exercise from too much work. No intent or thoughts of self harm.. The symptoms are aggravated by stress.    Past Medical History  Diagnosis Date  . Anxiety   . Hemorrhoids   . Bladder pain   . History of pelvic inflammatory disease   . History of gastritis     mild per EGD 03-26-2015  . Major depressive disorder (HCC)   . History of panic attacks   . Urgency of urination    Past Surgical History  Procedure Laterality Date  . Esophagogastroduodenoscopy  03-26-2015  . Cystoscopy with hydrodistension and biopsy N/A 06/27/2015    Procedure: CYSTOSCOPY/BLADDER BIOPSY/HYDRODISTENSION AND  INSTILLATION OF MARCAINE AND PYRIDIUM;  Surgeon: Ihor Gully, MD;  Location: [facility];  Service: Urology;  Laterality: N/A;   Family History  Problem Relation Age of Onset  . Colon cancer Neg Hx   . Colon polyps Neg Hx   . Diabetes Neg Hx   . Esophageal cancer Neg Hx   . Rectal cancer Neg Hx   . Stomach cancer Neg Hx   . Kidney disease Maternal Aunt   . Heart disease Maternal Aunt   . Gallbladder disease Maternal Aunt    Social History  Substance Use Topics  . Smoking status: Never Smoker   . Smokeless tobacco: Never Used  . Alcohol Use: No   OB History    No data available     Review of Systems  Constitutional: Positive for activity change and appetite change.  Respiratory: Negative.   Cardiovascular:  Negative.   Gastrointestinal: Negative.   Neurological: Negative.   Psychiatric/Behavioral: Positive for sleep disturbance and decreased concentration. Negative for suicidal ideas and self-injury. The patient is nervous/anxious.   All other systems reviewed and are negative.   Allergies  Almond oil; Doxycycline; and Peanuts  Home Medications   Prior to Admission medications   Medication Sig Start Date End Date Taking? Authorizing Provider  dicyclomine (BENTYL) 10 MG capsule Take one twice daily for bowels 07/21/15   Peyton Najjar, MD  ibuprofen (ADVIL,MOTRIN) 200 MG tablet Take 200 mg by mouth every 6 (six) hours as needed for fever.    Historical Provider, MD  LORazepam (ATIVAN) 1 MG tablet Take 1 tablet (1 mg total) by mouth 2 (two) times daily. Prn anxiety 08/04/15   Linna Hoff, MD  phenazopyridine (PYRIDIUM) 200 MG tablet Take 1 tablet (200 mg total) by mouth 3 (three) times daily as needed for pain. Patient not taking: Reported on 07/21/2015 06/27/15   Ihor Gully, MD  zolpidem (AMBIEN) 10 MG tablet Take 1 tablet (10 mg total) by mouth at bedtime as needed for sleep. 08/04/15 09/03/15  Linna Hoff, MD   Meds Ordered and Administered this Visit  Medications - No data to display  BP 99/68 mmHg  Pulse 74  Temp(Src) 98.1 F (36.7 C) (Oral)  Resp 12  SpO2 100%  LMP 07/17/2015 (Exact Date) No data found.  Physical Exam  Constitutional: She is oriented to person, place, and time. She appears well-developed and well-nourished.  Eyes: Pupils are equal, round, and reactive to light.  Neck: Normal range of motion. Neck supple.  Cardiovascular: Normal rate, regular rhythm, normal heart sounds and intact distal pulses.   Pulmonary/Chest: Breath sounds normal.  Lymphadenopathy:    She has no cervical adenopathy.  Neurological: She is alert and oriented to person, place, and time.  Skin: Skin is warm and dry.  Nursing note and vitals reviewed.   ED Course  Procedures  (including critical care time)  Labs Review Labs Reviewed - No data to display  Imaging Review No results found.   Visual Acuity Review  Right Eye Distance:   Left Eye Distance:   Bilateral Distance:    Right Eye Near:   Left Eye Near:    Bilateral Near:         MDM   1. Anxiety as acute reaction to exceptional stress        Linna Hoff, MD 08/04/15 [phone]

## 2015-08-04 NOTE — ED Notes (Signed)
Pt  Reports  History  Of    Anxiety     Under  Stressors  Lately          Symptoms  X  2 days     Pt   Reports  A  Decreased  Appetite         History  Of panic  Attacks

## 2015-08-04 NOTE — Discharge Instructions (Signed)
Use medicine as needed, see your doctor for further refills.

## 2015-08-08 ENCOUNTER — Encounter (HOSPITAL_COMMUNITY): Payer: Self-pay | Admitting: Emergency Medicine

## 2015-08-08 ENCOUNTER — Emergency Department (INDEPENDENT_AMBULATORY_CARE_PROVIDER_SITE_OTHER)
Admission: EM | Admit: 2015-08-08 | Discharge: 2015-08-08 | Disposition: A | Discharge status: Home / Self Care | Payer: No Typology Code available for payment source | Source: Home / Self Care | Attending: Family Medicine | Admitting: Family Medicine

## 2015-08-08 DIAGNOSIS — R21 Rash and other nonspecific skin eruption: Secondary | ICD-10-CM | POA: Diagnosis not present

## 2015-08-08 MED ORDER — PERMETHRIN 5 % EX CREA: 1.0000 "application " | TOPICAL_CREAM | Freq: Once | CUTANEOUS | Status: DC

## 2015-08-08 MED ORDER — ERYTHROMYCIN 2 % EX OINT: 1.0000 "application " | TOPICAL_OINTMENT | Freq: Two times a day (BID) | CUTANEOUS | Status: DC

## 2015-08-08 MED ORDER — DEXAMETHASONE 2 MG PO TABS
ORAL_TABLET | ORAL | Status: AC
  Filled 2015-08-08: qty 1

## 2015-08-08 MED ORDER — DEXAMETHASONE 4 MG PO TABS
10.0000 mg | ORAL_TABLET | Freq: Once | ORAL | Status: AC
  Administered 2015-08-08: 10 mg via ORAL

## 2015-08-08 MED ORDER — DEXAMETHASONE 4 MG PO TABS
ORAL_TABLET | ORAL | Status: AC
  Filled 2015-08-08: qty 2

## 2015-08-08 NOTE — ED Notes (Signed)
C/o rash on face onset 1 month Has been using acne med w/no relief Denies fevers, chills A&O x4... No acute distress.

## 2015-08-08 NOTE — Discharge Instructions (Signed)
As of your rashes not really clear. This may be due to scabies for a mild case of folliculitis or eczema. Please use the permethrin cream as discussed. He may then move on to either the erythromycin or steroids. Please follow-up in our clinic or with a dermatologist for symptoms are not improving.

## 2015-08-08 NOTE — ED Provider Notes (Signed)
CSN: [id number]     Arrival date & time 08/08/15  1327 History   First MD Initiated Contact with Patient 08/08/15 1446     Chief Complaint  Patient presents with  . Rash   (Consider location/radiation/quality/duration/timing/severity/associated sxs/prior Treatment) HPI  Itchy rash. Ongoing for the last 2-3 months. Constant. Progressive. Started along the right temporal hairline with radiation down the neck towards the upper chest and right shoulder and across the hairline towards the left temporal area. The rest of the scalp is spared. She is prescribed clindamycin by her primary care physician without improvement. She has tried over-the-counter acne medicines without improvement. Denies any nausea, vomiting, headache, neck stiffness, open sores, Weight loss, fevers, night sweats.  Past Medical History  Diagnosis Date  . Anxiety   . Hemorrhoids   . Bladder pain   . History of pelvic inflammatory disease   . History of gastritis     mild per EGD 03-26-2015  . Major depressive disorder (HCC)   . History of panic attacks   . Urgency of urination    Past Surgical History  Procedure Laterality Date  . Esophagogastroduodenoscopy  03-26-2015  . Cystoscopy with hydrodistension and biopsy N/A 06/27/2015    Procedure: CYSTOSCOPY/BLADDER BIOPSY/HYDRODISTENSION AND  INSTILLATION OF MARCAINE AND PYRIDIUM;  Surgeon: Ihor Gully, MD;  Location: [facility];  Service: Urology;  Laterality: N/A;   Family History  Problem Relation Age of Onset  . Colon cancer Neg Hx   . Colon polyps Neg Hx   . Diabetes Neg Hx   . Esophageal cancer Neg Hx   . Rectal cancer Neg Hx   . Stomach cancer Neg Hx   . Kidney disease Maternal Aunt   . Heart disease Maternal Aunt   . Gallbladder disease Maternal Aunt    Social History  Substance Use Topics  . Smoking status: Never Smoker   . Smokeless tobacco: Never Used  . Alcohol Use: No   OB History    No data available     Review of  Systems Per HPI with all other pertinent systems negative.   Allergies  Almond oil; Doxycycline; and Peanuts  Home Medications   Prior to Admission medications   Medication Sig Start Date End Date Taking? Authorizing Provider  LORazepam (ATIVAN) 1 MG tablet Take 1 tablet (1 mg total) by mouth 2 (two) times daily. Prn anxiety 08/04/15  Yes Linna Hoff, MD  zolpidem (AMBIEN) 10 MG tablet Take 1 tablet (10 mg total) by mouth at bedtime as needed for sleep. 08/04/15 09/03/15 Yes Linna Hoff, MD  dicyclomine (BENTYL) 10 MG capsule Take one twice daily for bowels 07/21/15   Peyton Najjar, MD  ibuprofen (ADVIL,MOTRIN) 200 MG tablet Take 200 mg by mouth every 6 (six) hours as needed for fever.    Historical Provider, MD  phenazopyridine (PYRIDIUM) 200 MG tablet Take 1 tablet (200 mg total) by mouth 3 (three) times daily as needed for pain. Patient not taking: Reported on 07/21/2015 06/27/15   Ihor Gully, MD   Meds Ordered and Administered this Visit  Medications - No data to display  LMP 07/17/2015 (Exact Date) No data found.   Physical Exam Physical Exam  Constitutional: oriented to person, place, and time. appears well-developed and well-nourished. No distress.  HENT:  Head: Normocephalic and atraumatic.  Eyes: EOMI. PERRL.  Neck: Normal range of motion.  Cardiovascular: RRR, no m/r/g, 2+ distal pulses,  Pulmonary/Chest: Effort normal and breath sounds normal. No respiratory distress.  Abdominal: Soft. Bowel sounds are normal. NonTTP, no distension.  Musculoskeletal: Normal range of motion. Non ttp, no effusion.  Neurological: alert and oriented to person, place, and time.  Skin: Faint papular rash along hairline and midline upper chest.  Psychiatric: normal mood and affect. behavior is normal. Judgment and thought content normal.   ED Course  Procedures (including critical care time)  Labs Review Labs Reviewed - No data to display  Imaging Review No results  found.   Visual Acuity Review  Right Eye Distance:   Left Eye Distance:   Bilateral Distance:    Right Eye Near:   Left Eye Near:    Bilateral Near:         MDM  No diagnosis found.  Etiology not immediately clear but suspect scabies versus eczematous rash versus mild acne. Patient to use permethrin cream. If that does not improve symptoms and patient will use either erythromycin ointment for topical hydrocortisone. Decadron 10 mg by mouth given for itch relief and for treatment of possible eczematous etiology.    Ozella Rocksavid J Merrell, MD 08/08/15 [phone]

## 2015-08-12 ENCOUNTER — Encounter: Payer: Self-pay | Admitting: Gastroenterology

## 2015-08-12 ENCOUNTER — Ambulatory Visit (INDEPENDENT_AMBULATORY_CARE_PROVIDER_SITE_OTHER): Payer: No Typology Code available for payment source | Admitting: Gastroenterology

## 2015-08-12 VITALS — BP 100/60 | HR 80 | Ht 60.0 in | Wt 85.8 lb

## 2015-08-12 DIAGNOSIS — G8929 Other chronic pain: Secondary | ICD-10-CM | POA: Diagnosis not present

## 2015-08-12 DIAGNOSIS — R1031 Right lower quadrant pain: Secondary | ICD-10-CM | POA: Diagnosis not present

## 2015-08-12 MED ORDER — NA SULFATE-K SULFATE-MG SULF 17.5-3.13-1.6 GM/177ML PO SOLN: 1.0000 | Freq: Once | ORAL | Status: DC

## 2015-08-12 NOTE — Progress Notes (Signed)
Review of pertinent gastrointestinal problems: 1. Abdominal pain:  CT scan 01/2015 with IV contrast: 1. 2.0 by 1.4 cm follicle or simple cyst of the right ovary uncomplicated, similar appearance on ultrasound. No free pelvic fluid or complexity of the structure to suggest that this is asource of symptoms.2. Appendix unremarkable.3. Thickened endometrium. Please see prior ultrasound report forfollowup suggestion.4. Prominent stool throughout the colon favors constipation. Labs 02/2015 cbc, cmet, lipase all normal. [place] 02/2015 1. Retroflexed uterus without focal mass.2. Endometrium continues to appeared thickened without focal mass evident. Endometrium cannot be fully evaluated given the phase ofthe menstrual cycle. Recommend follow-up by [place] in 6-8 weeks, during the week immediately following menses (exam timing is critical).3. Normal appearance of the ovaries. EGD Dr. Christella Hartigan 03/2015 found non-specific mild gastritis. H. Pylori negative  HPI: This is a  very pleasant 30 year old woman who is here with her mother today I last saw her through 4 months ago  Chief complaint is persistent lower abdominal pain  Underwent cystoscopy about a month ago.  The bladder appeared normal.  The pains is now in lower abdomen, intermittent crampy abd pains.  Sometimes with oily foods.  Bowels are normal; no bleeding, no diarrhea.  Pain improved with BMs, very reliably.  Past Medical History  Diagnosis Date  . Anxiety   . Hemorrhoids   . Bladder pain   . History of pelvic inflammatory disease   . History of gastritis     mild per EGD 03-26-2015  . Major depressive disorder (HCC)   . History of panic attacks   . Urgency of urination     Past Surgical History  Procedure Laterality Date  . Esophagogastroduodenoscopy  03-26-2015  . Cystoscopy with hydrodistension and biopsy N/A 06/27/2015    Procedure: CYSTOSCOPY/BLADDER BIOPSY/HYDRODISTENSION AND  INSTILLATION OF MARCAINE AND PYRIDIUM;  Surgeon: Ihor Gully, MD;   Location: [facility];  Service: Urology;  Laterality: N/A;    Current Outpatient Prescriptions  Medication Sig Dispense Refill  . dicyclomine (BENTYL) 10 MG capsule Take one twice daily for bowels 60 capsule 1  . Erythromycin 2 % ointment Apply 1 application topically 2 (two) times daily. 25 g 0  . LORazepam (ATIVAN) 1 MG tablet Take 1 tablet (1 mg total) by mouth 2 (two) times daily. Prn anxiety 20 tablet 0  . permethrin (ELIMITE) 5 % cream Apply 1 application topically once. 60 g 0  . phenazopyridine (PYRIDIUM) 200 MG tablet Take 1 tablet (200 mg total) by mouth 3 (three) times daily as needed for pain. 30 tablet 0  . zolpidem (AMBIEN) 10 MG tablet Take 1 tablet (10 mg total) by mouth at bedtime as needed for sleep. 10 tablet 0   No current facility-administered medications for this visit.    Allergies as of 08/12/2015 - Review Complete 08/12/2015  Allergen Reaction Noted  . Almond oil Rash 01/29/2015  . Doxycycline Nausea Only 03/15/2015  . Peanuts [peanut oil] Rash 01/29/2015    Family History  Problem Relation Age of Onset  . Colon cancer Neg Hx   . Colon polyps Neg Hx   . Diabetes Neg Hx   . Esophageal cancer Neg Hx   . Rectal cancer Neg Hx   . Stomach cancer Neg Hx   . Kidney disease Maternal Aunt   . Heart disease Maternal Aunt   . Gallbladder disease Maternal Aunt     Social History   Social History  . Marital Status: Single    Spouse Name: N/A  .  Number of Children: 0  . Years of Education: N/A   Occupational History  . Doctor-PHD [employer]    Social History Main Topics  . Smoking status: Never Smoker   . Smokeless tobacco: Never Used  . Alcohol Use: No  . Drug Use: No  . Sexual Activity: Not on file   Other Topics Concern  . Not on file   Social History Narrative   Marital status: engaged      Children: none      Lives: alone      Employment: completed PhD; working at [employer]; postdoctorate degree      Tobacco: none      Alcohol:  socially      Exercise:  Not currently; before twice per week.       Physical Exam: BP 100/60 mmHg  Pulse 80  Ht 5' (1.524 m)  Wt 85 lb 12.8 oz (38.919 kg)  BMI 16.76 kg/m2  LMP 07/17/2015 (Exact Date) Constitutional: generally well-appearing Psychiatric: alert and oriented x3 Abdomen: soft, nontender, nondistended, no obvious ascites, no peritoneal signs, normal bowel sounds   Assessment and plan: 29 y.o. female with lower abdominal pain that is usually relieved with bowel movements  I suspect she has irritable bowel syndrome. Her primary care asked that we proceed with colonoscopy think that is not unreasonable. The patient was very interested in that as well. We'll proceed with colonoscopy at her soonest convenience. I see no reason for any further blood tests or imaging studies prior to then   Rob Buntinganiel Rorik Vespa, MD [facility] 08/12/2015, 11:23 AM

## 2015-08-12 NOTE — Patient Instructions (Signed)
You will be set up for a colonoscopy for lower abdominal pains.

## 2015-08-15 ENCOUNTER — Encounter: Payer: Self-pay | Admitting: Gastroenterology

## 2015-08-15 ENCOUNTER — Ambulatory Visit (AMBULATORY_SURGERY_CENTER): Payer: No Typology Code available for payment source | Admitting: Gastroenterology

## 2015-08-15 VITALS — BP 91/60 | HR 72 | Temp 98.5°F | Resp 15 | Ht 60.0 in | Wt 85.0 lb

## 2015-08-15 DIAGNOSIS — R1031 Right lower quadrant pain: Secondary | ICD-10-CM

## 2015-08-15 DIAGNOSIS — G8929 Other chronic pain: Secondary | ICD-10-CM | POA: Diagnosis not present

## 2015-08-15 MED ORDER — SODIUM CHLORIDE 0.9 % IV SOLN: 500.0000 mL | INTRAVENOUS | Status: DC

## 2015-08-15 NOTE — Progress Notes (Signed)
To recovery, report to [place], VSS

## 2015-08-15 NOTE — Op Note (Signed)
[facility] [address] [place], [id number]   COLONOSCOPY PROCEDURE REPORT  PATIENT: Dawn Price, Dawn Price  MR#: [id number] BIRTHDATE: 1986-08-23 , 29  yrs. old GENDER: female ENDOSCOPIST: Rachael Feeaniel P Jacobs, MD PROCEDURE DATE:  08/15/2015 PROCEDURE:   Colonoscopy, diagnostic First Screening Colonoscopy - Avg.  risk and is 50 yrs.  old or older - No.  Prior Negative Screening - Now for repeat screening. N/A  History of Adenoma - Now for follow-up colonoscopy & has been > or = to 3 yrs.  N/A  Recommend repeat exam, <10 yrs? No ASA CLASS:   Class II INDICATIONS:lower abdominal pains. MEDICATIONS: Monitored anesthesia care and Propofol 150 mg IV  DESCRIPTION OF PROCEDURE:   After the risks benefits and alternatives of the procedure were thoroughly explained, informed consent was obtained.  The digital rectal exam revealed no abnormalities of the rectum.   The LB [id number]-[id number]  endoscope was introduced through the anus and advanced to the terminal ileum which was intubated for a short distance. No adverse events experienced.   The quality of the prep was excellent.  The instrument was then slowly withdrawn as the colon was fully examined. Estimated blood loss is zero unless otherwise noted in this procedure report.   COLON FINDINGS: The examined terminal ileum appeared to be normal. A normal appearing cecum, ileocecal valve, and appendiceal orifice were identified.  The ascending, transverse, descending, sigmoid colon, and rectum appeared unremarkable.  Retroflexed views revealed no abnormalities. The time to cecum = 2.1 Withdrawal time = 6.4   The scope was withdrawn and the procedure completed. COMPLICATIONS: There were no immediate complications.  ENDOSCOPIC IMPRESSION: 1.   The examined terminal ileum appeared to be normal 2.   Normal colonoscopy  RECOMMENDATIONS: Continue twice daily anti-spasm medicines for likely Irritable Bowel Syndrome  eSigned:   Rachael Feeaniel P Jacobs, MD 08/15/2015 10:24 AM

## 2015-08-15 NOTE — Progress Notes (Signed)
Pt took longer to get dressed, had pt sit on side of bed for a few minutes before she started to get dressed to make sure she was going to be ok-adm

## 2015-08-15 NOTE — Patient Instructions (Addendum)
YOU HAD AN ENDOSCOPIC PROCEDURE TODAY AT THE [facility]:   Refer to the procedure report that was given to you for any specific questions about what was found during the examination.  If the procedure report does not answer your questions, please call your gastroenterologist to clarify.  If you requested that your care partner not be given the details of your procedure findings, then the procedure report has been included in a sealed envelope for you to review at your convenience later.  YOU SHOULD EXPECT: Some feelings of bloating in the abdomen. Passage of more gas than usual.  Walking can help get rid of the air that was put into your GI tract during the procedure and reduce the bloating. If you had a lower endoscopy (such as a colonoscopy or flexible sigmoidoscopy) you may notice spotting of blood in your stool or on the toilet paper. If you underwent a bowel prep for your procedure, you may not have a normal bowel movement for a few days.  Please Note:  You might notice some irritation and congestion in your nose or some drainage.  This is from the oxygen used during your procedure.  There is no need for concern and it should clear up in a day or so.  SYMPTOMS TO REPORT IMMEDIATELY:   Following lower endoscopy (colonoscopy or flexible sigmoidoscopy):  Excessive amounts of blood in the stool  Significant tenderness or worsening of abdominal pains  Swelling of the abdomen that is new, acute  Fever of 100F or higher   For urgent or emergent issues, a gastroenterologist can be reached at any hour by calling [phone].   DIET: Your first meal following the procedure should be a small meal and then it is ok to progress to your normal diet. Heavy or fried foods are harder to digest and may make you feel nauseous or bloated.  Likewise, meals heavy in dairy and vegetables can increase bloating.  Drink plenty of fluids but you should avoid alcoholic beverages for 24  hours.  ACTIVITY:  You should plan to take it easy for the rest of today and you should NOT DRIVE or use heavy machinery until tomorrow (because of the sedation medicines used during the test).    FOLLOW UP: Our staff will call the number listed on your records the next business day following your procedure to check on you and address any questions or concerns that you may have regarding the information given to you following your procedure. If we do not reach you, we will leave a message.  However, if you are feeling well and you are not experiencing any problems, there is no need to return our call.  We will assume that you have returned to your regular daily activities without incident.  If any biopsies were taken you will be contacted by phone or by letter within the next 1-3 weeks.  Please call us at [phone] if you have not heard about the biopsies in 3 weeks.    SIGNATURES/CONFIDENTIALITY: You and/or your care partner have signed paperwork which will be entered into your electronic medical record.  These signatures attest to the fact that that the information above on your After Visit Summary has been reviewed and is understood.  Full responsibility of the confidentiality of this discharge information lies with you and/or your care-partner.  Continue bentyl twice a day for spasm.

## 2015-08-15 NOTE — Progress Notes (Signed)
Dental advisory given to patient 

## 2015-08-18 ENCOUNTER — Telehealth: Payer: Self-pay | Admitting: Emergency Medicine

## 2015-08-18 NOTE — Telephone Encounter (Signed)
  Follow up Call-  Call back number 08/15/2015 03/26/2015  Post procedure Call Back phone  # [phone] cell [phone]  Permission to leave phone message Yes Yes     Patient questions:  Do you have a fever, pain , or abdominal swelling? No. Pain Score  0 *  Have you tolerated food without any problems? Yes.    Have you been able to return to your normal activities? Yes.    Do you have any questions about your discharge instructions: Diet   No. Medications  No. Follow up visit  No.  Do you have questions or concerns about your Care? Yes.    Actions: * If pain score is 4 or above: Physician/ provider Notified : Rob Buntinganiel Jacobs, MD.

## 2015-08-22 ENCOUNTER — Telehealth: Payer: Self-pay | Admitting: Gastroenterology

## 2015-08-22 NOTE — Telephone Encounter (Signed)
Dr. Christella HartiganJacobs, Patient called stating that she is experiencing bloating since her colonoscopy on 08-15-15 and some dizziness. She states that the dizziness is a lot better since Monday but the bloating is not going away. Pt was advised to try OTC Gas-x to see if that helps with some of her symptoms. She was advised to go to the ED if symptoms get worse. Patient verbalized understanding.

## 2015-08-23 NOTE — Telephone Encounter (Signed)
i agree, thanks 

## 2015-09-17 ENCOUNTER — Encounter: Payer: Self-pay | Admitting: Family Medicine

## 2015-09-17 ENCOUNTER — Ambulatory Visit: Payer: No Typology Code available for payment source | Attending: Family Medicine | Admitting: Family Medicine

## 2015-09-17 VITALS — BP 100/67 | HR 92 | Temp 97.6°F | Resp 14 | Ht 60.0 in | Wt 86.0 lb

## 2015-09-17 DIAGNOSIS — R634 Abnormal weight loss: Secondary | ICD-10-CM

## 2015-09-17 DIAGNOSIS — K5909 Other constipation: Secondary | ICD-10-CM

## 2015-09-17 DIAGNOSIS — R232 Flushing: Secondary | ICD-10-CM

## 2015-09-17 DIAGNOSIS — K219 Gastro-esophageal reflux disease without esophagitis: Secondary | ICD-10-CM | POA: Diagnosis not present

## 2015-09-17 DIAGNOSIS — R102 Pelvic and perineal pain: Secondary | ICD-10-CM | POA: Insufficient documentation

## 2015-09-17 DIAGNOSIS — Z8719 Personal history of other diseases of the digestive system: Secondary | ICD-10-CM | POA: Diagnosis not present

## 2015-09-17 DIAGNOSIS — R21 Rash and other nonspecific skin eruption: Secondary | ICD-10-CM

## 2015-09-17 MED ORDER — LACTULOSE 10 GM/15ML PO SOLN: 10.0000 g | Freq: Two times a day (BID) | ORAL | Status: DC | PRN

## 2015-09-17 MED ORDER — PANTOPRAZOLE SODIUM 20 MG PO TBEC: 20.0000 mg | DELAYED_RELEASE_TABLET | Freq: Every day | ORAL | Status: DC

## 2015-09-17 NOTE — Progress Notes (Signed)
CC: Here to establish care  HPI: Dawn Price is a 29 y.o. female with a history of GERD and gastritis not currently on any medication who comes into the clinic to establish care.  She complains of abdominal pain located in suprapubic area associated with suprapubic swelling which she has had since 01/2015. Pain is intermittent and occurs about every other day and feels more like a pressure. Eating has been known to exacerbate the pain and swelling and more so eating beef and so she has limited her diet to rice and Lentils with which she does not experience the pain. Endorses weight loss of about 10-12 pounds, fatigue, lightheadedness development of spontaneous nonpruritic rash in her face and trunk as well as flakiness of the skin. She has noticed some flushing as well. Denies diarrhea but occasionally has constipation but she admits to eating a "healthy diet".  She was previously followed by a PCP at [facility] and has been to see multiple physicians regarding this issue. She had a negative cystoscopy, normal pelvic ultrasound (in 04/2015) Pap smear was normal in 03/2015, CT abdomen and pelvis without contrast was negative; she also saw GI and has had an upper endoscopy and colonoscopy from 03/26/15 and 08/15/15 which revealed mild nonspecific gastritis and a normal colonoscopy. Blood work revealed a negative HIV test, normal TSH, negative Gliadin IgA and IgG, negative tissue transglutaminase antibody, negative ANA, negative endomysial screen.  Allergies  Allergen Reactions  . Almond Oil Rash  . Doxycycline Nausea Only  . Peanuts [Peanut Oil] Rash   Past Medical History  Diagnosis Date  . Anxiety   . Hemorrhoids   . Bladder pain   . History of pelvic inflammatory disease   . History of gastritis     mild per EGD 03-26-2015  . Major depressive disorder (HCC)   . History of panic attacks   . Urgency of urination   . GERD (gastroesophageal reflux disease)   . Vitamin D deficiency   .  Thyroid disease     "TSH sometimes goes down" per pt   Current Outpatient Prescriptions on File Prior to Visit  Medication Sig Dispense Refill  . dicyclomine (BENTYL) 10 MG capsule Take one twice daily for bowels (Patient not taking: Reported on 09/17/2015) 60 capsule 1  . Erythromycin 2 % ointment Apply 1 application topically 2 (two) times daily. (Patient not taking: Reported on 09/17/2015) 25 g 0  . LORazepam (ATIVAN) 1 MG tablet Take 1 tablet (1 mg total) by mouth 2 (two) times daily. Prn anxiety (Patient not taking: Reported on 09/17/2015) 20 tablet 0  . permethrin (ELIMITE) 5 % cream Apply 1 application topically once. (Patient not taking: Reported on 08/15/2015) 60 g 0  . phenazopyridine (PYRIDIUM) 200 MG tablet Take 1 tablet (200 mg total) by mouth 3 (three) times daily as needed for pain. (Patient not taking: Reported on 08/15/2015) 30 tablet 0  . zolpidem (AMBIEN) 10 MG tablet Take 1 tablet (10 mg total) by mouth at bedtime as needed for sleep. 10 tablet 0   No current facility-administered medications on file prior to visit.   Family History  Problem Relation Age of Onset  . Colon cancer Neg Hx   . Esophageal cancer Neg Hx   . Rectal cancer Neg Hx   . Stomach cancer Neg Hx   . Kidney disease Maternal Aunt   . Heart disease Maternal Aunt   . Gallbladder disease Maternal Aunt    Social History   Social History  .  Marital Status: Single    Spouse Name: N/A  . Number of Children: 0  . Years of Education: N/A   Occupational History  . Doctor-PHD [employer]    Social History Main Topics  . Smoking status: Never Smoker   . Smokeless tobacco: Never Used  . Alcohol Use: No  . Drug Use: No  . Sexual Activity: Not on file   Other Topics Concern  . Not on file   Social History Narrative   Marital status: engaged      Children: none      Lives: alone      Employment: completed PhD; working at [employer]; postdoctorate degree      Tobacco: none      Alcohol: socially       Exercise:  Not currently; before twice per week.      Review of Systems: Constitutional: Negative for fever, chills, diaphoresis, activity change, positive for weight loss HENT: Negative for ear pain, nosebleeds, congestion, facial swelling, rhinorrhea, neck pain, neck stiffness and ear discharge.  Eyes: Negative for pain, discharge, redness, itching and visual disturbance. Respiratory: Negative for cough, choking, chest tightness, shortness of breath, wheezing and stridor.  Cardiovascular: Negative for chest pain, palpitations and leg swelling. Gastrointestinal: Positive for lower abdominal pain and distension Genitourinary: Negative for dysuria, urgency, frequency, hematuria, flank pain, decreased urine volume, difficulty urinating and dyspareunia.  Musculoskeletal: Negative for back pain, joint swelling, arthralgias and gait problem. Neurological: Negative for dizziness, tremors, seizures, syncope, facial asymmetry, speech difficulty, weakness, light-headedness, numbness and headaches.  Hematological: Negative for adenopathy. Does not bruise/bleed easily. Skin; positive for rash Psychiatric/Behavioral: Negative for hallucinations, behavioral problems, confusion, dysphoric mood, decreased concentration and agitation.    Objective:   Filed Vitals:   09/17/15 1224  BP: 100/67  Pulse: 92  Temp: 97.6 F (36.4 C)  Resp: 14    Physical Exam: Constitutional: Patient is not acutely ill looking,with a BMI below normal (16.8) HENT: Normocephalic, atraumatic, External right and left ear normal. Oropharynx is clear and moist.  Eyes: Conjunctivae and EOM are normal. PERRLA, no scleral icterus. Neck: Normal ROM. Neck supple. No JVD. No tracheal deviation. No thyromegaly. CVS: RRR, S1/S2 +, no murmurs, no gallops, no carotid bruit.  Pulmonary: Effort and breath sounds normal, no stridor, rhonchi, wheezes, rales.  Abdominal: Soft. BS +,  no distension, tenderness, rebound or guarding.    Musculoskeletal: Normal range of motion. No edema and no tenderness.  Lymphadenopathy: No lymphadenopathy noted, cervical, inguinal or axillary Neuro: Alert. Normal reflexes, muscle tone coordination. No cranial nerve deficit. Skin: multiple hyper pigmented rash on back, diffuse erythematous nodular rash on face and trunk anterioly and posteriorly Psychiatric: Normal mood and affect. Behavior, judgment, thought content normal.  Lab Results  Component Value Date   WBC 5.6 07/21/2015   HGB 13.4 07/21/2015   HCT 39.6 07/21/2015   MCV 89.2 07/21/2015   PLT 350 07/21/2015   Lab Results  Component Value Date   CREATININE 0.61 07/03/2015   BUN 14 07/03/2015   NA 137 07/03/2015   K 4.5 07/03/2015   CL 106 07/03/2015   CO2 26 07/03/2015    Lab Results  Component Value Date   HGBA1C 4.8 11/22/2014   Lipid Panel     Component Value Date/Time   CHOL 113 11/22/2014 1003   TRIG 37 11/22/2014 1003   HDL 42 11/22/2014 1003   CHOLHDL 2.7 11/22/2014 1003   VLDL 7 11/22/2014 1003   LDLCALC 64 11/22/2014 1003  Assessment and plan:  Chronic abdominal pain and swelling: Previous workup including cystoscopy, pelvic ultrasound, CT scan of the abdomen and pelvis without contrast unrevealing. Up-to-date on Pap smears. In the setting of weight loss, malignancy is a concern- CT scan of the abdomen and pelvis with contrast ordered. Labs also sent off to exclude carcinoid syndrome. H. pylori breath test to evaluate for H. pylori gastritis. Stool cultures as well.  GERD and gastritis: Commenced on PPI.  Constipation: This is intermittent. Advised and increasing fiber intake and use of OTC Metamucil. I have placed on lactulose.      Jaclyn ShaggyEnobong, Amao, MD. [facility] [phone] 09/17/2015, 1:53 PM

## 2015-09-17 NOTE — Progress Notes (Signed)
Pt's here to est. care with PCP. Pt denies pain.

## 2015-09-17 NOTE — Patient Instructions (Signed)

## 2015-10-02 ENCOUNTER — Other Ambulatory Visit: Payer: Self-pay

## 2015-10-06 ENCOUNTER — Ambulatory Visit: Payer: No Typology Code available for payment source | Attending: Family Medicine

## 2015-10-06 ENCOUNTER — Other Ambulatory Visit: Payer: Self-pay | Admitting: *Deleted

## 2015-10-06 ENCOUNTER — Other Ambulatory Visit: Payer: Self-pay | Admitting: Family Medicine

## 2015-10-06 DIAGNOSIS — R232 Flushing: Secondary | ICD-10-CM

## 2015-10-06 DIAGNOSIS — R634 Abnormal weight loss: Secondary | ICD-10-CM

## 2015-10-06 DIAGNOSIS — K219 Gastro-esophageal reflux disease without esophagitis: Secondary | ICD-10-CM

## 2015-10-06 DIAGNOSIS — R102 Pelvic and perineal pain: Secondary | ICD-10-CM

## 2015-10-06 DIAGNOSIS — R109 Unspecified abdominal pain: Secondary | ICD-10-CM

## 2015-10-06 DIAGNOSIS — R21 Rash and other nonspecific skin eruption: Secondary | ICD-10-CM

## 2015-10-06 DIAGNOSIS — Z8719 Personal history of other diseases of the digestive system: Secondary | ICD-10-CM

## 2015-10-07 LAB — ~~LOC~~ ALLERGY PANEL
Allergen, Cedar tree, t12: <0.1 kU/L
Allergen, Comm Silver Birch, t9: <0.1 kU/L
Allergen, D pternoyssinus,d7: <0.1 kU/L
Bermuda Grass: <0.1 kU/L
Cat Dander: <0.1 kU/L
Cladosporium Herbarum: <0.1 kU/L
D. farinae: <0.1 kU/L
Dog Dander: <0.1 kU/L
Elm IgE: <0.1 kU/L
Mucor Racemosus: <0.1 kU/L
Pecan/Hickory Tree IgE: <0.1 kU/L
Penicillium Notatum: <0.1 kU/L
Rough Pigweed  IgE: <0.1 kU/L
Sheep Sorrel IgE: <0.1 kU/L

## 2015-10-07 LAB — CA 125: CA 125: 18 U/mL (ref <35)

## 2015-10-07 LAB — OVA AND PARASITE EXAMINATION: OP: NONE SEEN

## 2015-10-07 LAB — H. PYLORI BREATH TEST: H. PYLORI BREATH TEST: NOT DETECTED

## 2015-10-08 ENCOUNTER — Telehealth: Payer: Self-pay

## 2015-10-08 ENCOUNTER — Telehealth: Payer: Self-pay | Admitting: Family Medicine

## 2015-10-08 NOTE — Telephone Encounter (Signed)
CMA called patient, patient verified name and DOB. Patient was given her upcoming appt on the 19th @ 1:00pm, but she needs to arrive at 12:45 the day of her appt.   I informed patient of the instructions given. [facility], [facility]16[facility] to pick up the 2 bottles of contrast at the Radiology Dept. She's not to drink the contrast until the day of her appt. The first bottle she will drink at 11am, and the second bottle at 12 noon. 4 hours prior to her CT, she is not to eat or drink anything other than the contrast.Patient was given the number to the Radiology Dept if she needs to reschedule or have any questions. Patient verbalized she understood with no further questions.

## 2015-10-08 NOTE — Telephone Encounter (Signed)
Patient verified name and DOB. Patient was given lab results and asked about having results sent to my chart. I told patient i will make sure its in for her to view. Patient had no further questions.

## 2015-10-08 NOTE — Telephone Encounter (Signed)
-----   Message from Jaclyn ShaggyEnobong Amao, MD sent at 10/08/2015  8:36 AM EST ----- Stool culture, ovarian cancer screen negative

## 2015-10-08 NOTE — Telephone Encounter (Signed)
Pt. Called to know where she is going to have her CT scan done. Please f/u with pt.

## 2015-10-09 LAB — CHROMOGRANIN A: Chromogranin A: 7 ng/mL (ref <=15)

## 2015-10-10 ENCOUNTER — Telehealth: Payer: Self-pay

## 2015-10-10 NOTE — Telephone Encounter (Signed)
CMA called patient, patient verified name and DOB. CMA called patient to alert her that her lab results was available on mychart. Patient states that she was able to review some of labs results. I told patient that if she doesn't see the rest of her results by Tuesday, or Wednesday, then give us a call and we'll go from there. Patient verbalized that she understood with no further questions.

## 2015-10-11 ENCOUNTER — Ambulatory Visit: Payer: No Typology Code available for payment source

## 2015-10-11 LAB — SEROTONIN SERUM: SEROTONIN, SERUM: 71 ng/mL (ref 56–244)

## 2015-10-13 ENCOUNTER — Ambulatory Visit (HOSPITAL_COMMUNITY): Admission: RE | Admit: 2015-10-13 | Payer: Self-pay | Source: Ambulatory Visit

## 2015-10-14 ENCOUNTER — Telehealth: Payer: Self-pay | Admitting: *Deleted

## 2015-10-14 NOTE — Telephone Encounter (Signed)
Informed by the nurse verbally that when patient was called she verified that she had received her results via my chart

## 2015-10-14 NOTE — Telephone Encounter (Signed)
Spoke with patient to verify she received MyChart message.  Patient states she will check.  RN gave patient phone number to call back if message not received.

## 2015-10-14 NOTE — Telephone Encounter (Signed)
Patient called in and left message stating that she did not get the Mychart message that was sent.  Please follow up.

## 2015-10-18 ENCOUNTER — Encounter (HOSPITAL_COMMUNITY): Payer: Self-pay | Admitting: Emergency Medicine

## 2015-10-18 ENCOUNTER — Emergency Department (HOSPITAL_COMMUNITY)
Admission: EM | Admit: 2015-10-18 | Discharge: 2015-10-19 | Disposition: A | Discharge status: Home / Self Care | Payer: PRIVATE HEALTH INSURANCE | Attending: Emergency Medicine | Admitting: Emergency Medicine

## 2015-10-18 DIAGNOSIS — R300 Dysuria: Secondary | ICD-10-CM | POA: Diagnosis not present

## 2015-10-18 DIAGNOSIS — G8929 Other chronic pain: Secondary | ICD-10-CM | POA: Diagnosis not present

## 2015-10-18 DIAGNOSIS — Z79899 Other long term (current) drug therapy: Secondary | ICD-10-CM | POA: Diagnosis not present

## 2015-10-18 DIAGNOSIS — K219 Gastro-esophageal reflux disease without esophagitis: Secondary | ICD-10-CM | POA: Insufficient documentation

## 2015-10-18 DIAGNOSIS — F419 Anxiety disorder, unspecified: Secondary | ICD-10-CM | POA: Diagnosis not present

## 2015-10-18 DIAGNOSIS — R1030 Lower abdominal pain, unspecified: Secondary | ICD-10-CM

## 2015-10-18 DIAGNOSIS — N898 Other specified noninflammatory disorders of vagina: Secondary | ICD-10-CM | POA: Insufficient documentation

## 2015-10-18 DIAGNOSIS — Z8639 Personal history of other endocrine, nutritional and metabolic disease: Secondary | ICD-10-CM | POA: Diagnosis not present

## 2015-10-18 DIAGNOSIS — F410 Panic disorder [episodic paroxysmal anxiety] without agoraphobia: Secondary | ICD-10-CM | POA: Diagnosis not present

## 2015-10-18 LAB — WET PREP, GENITAL
Clue Cells Wet Prep HPF POC: NONE SEEN
Sperm: NONE SEEN
Trich, Wet Prep: NONE SEEN
YEAST WET PREP: NONE SEEN

## 2015-10-18 LAB — URINALYSIS, ROUTINE W REFLEX MICROSCOPIC
BILIRUBIN URINE: NEGATIVE
Glucose, UA: NEGATIVE mg/dL
HGB URINE DIPSTICK: NEGATIVE
KETONES UR: NEGATIVE mg/dL
Leukocytes, UA: NEGATIVE
NITRITE: NEGATIVE
PROTEIN: NEGATIVE mg/dL
Specific Gravity, Urine: 1.018 (ref 1.005–1.030)
pH: 7.5 (ref 5.0–8.0)

## 2015-10-18 LAB — COMPREHENSIVE METABOLIC PANEL
ALBUMIN: 4.1 g/dL (ref 3.5–5.0)
ALT: 19 U/L (ref 14–54)
ANION GAP: 8 (ref 5–15)
AST: 23 U/L (ref 15–41)
Alkaline Phosphatase: 52 U/L (ref 38–126)
BUN: 10 mg/dL (ref 6–20)
CHLORIDE: 108 mmol/L (ref 101–111)
CO2: 26 mmol/L (ref 22–32)
Calcium: 9.5 mg/dL (ref 8.9–10.3)
Creatinine, Ser: 0.71 mg/dL (ref 0.44–1.00)
GFR calc Af Amer: >60 mL/min (ref >60)
GFR calc non Af Amer: >60 mL/min (ref >60)
GLUCOSE: 90 mg/dL (ref 65–99)
POTASSIUM: 4.1 mmol/L (ref 3.5–5.1)
Sodium: 142 mmol/L (ref 135–145)
TOTAL PROTEIN: 6.8 g/dL (ref 6.5–8.1)
Total Bilirubin: 0.8 mg/dL (ref 0.3–1.2)

## 2015-10-18 LAB — CBC
HEMATOCRIT: 37.2 % (ref 36.0–46.0)
HEMOGLOBIN: 12.2 g/dL (ref 12.0–15.0)
MCH: 29.6 pg (ref 26.0–34.0)
MCHC: 32.8 g/dL (ref 30.0–36.0)
MCV: 90.3 fL (ref 78.0–100.0)
Platelets: 235 10*3/uL (ref 150–400)
RBC: 4.12 MIL/uL (ref 3.87–5.11)
RDW: 12 % (ref 11.5–15.5)
WBC: 6.4 10*3/uL (ref 4.0–10.5)

## 2015-10-18 LAB — I-STAT BETA HCG BLOOD, ED (MC, WL, AP ONLY)

## 2015-10-18 LAB — LIPASE, BLOOD: LIPASE: 34 U/L (ref 11–51)

## 2015-10-18 NOTE — ED Notes (Signed)
Pt st's she has been having lower abd pain since April but pain started to increase after getting a CT here in June.  Pt st's pain radiates from right lower abd to left

## 2015-10-18 NOTE — ED Notes (Signed)
Pt c /o lower abdominal pain ongoing since April. Pt has had multiple tests done ie: CT scan, endoscopy. Pt also reports urinary urgency.

## 2015-10-18 NOTE — ED Provider Notes (Signed)
CSN: [id number]     Arrival date & time 10/18/15  1607 History   First MD Initiated Contact with Patient 10/18/15 2128     Chief Complaint  Patient presents with  . Abdominal Pain   29 year old [place] female who presents with worsening chronic abdominal pain. Patient has had extensive workup since this all began in April 2016. She's had a cystoscopy, abdominal CT scans, colonoscopy, STD workup, cancer workup, and other testing by numerous providers without any identification of the cause of her pain. She describes the pain as a pressure sensation in her lower abdomen over her bladder. She also describes the pain of a needle sensation going from her right hip. Her abdomen over to the left side. She also has frequent UTI symptoms with dysuria, frequency of urination, and intermittent flank pain. She presents today with the same pressure sensation in her suprapubic area as well as left flank pain and frequency of urination. She has lost 10 lbs in 8 months.  Pt is on her period and also has lower abdominal cramping.   She denies any fevers, chills, vaginal discharge, vaginal odor, abdominal distention, diarrhea, constipation, nausea, rash. Is not sexually active.   (Consider location/radiation/quality/duration/timing/severity/associated sxs/prior Treatment) Patient is a 29 y.o. female presenting with abdominal pain.  Abdominal Pain Pain location:  Suprapubic Pain quality: pressure and sharp   Pain severity:  Moderate Onset quality:  Gradual Duration: months. Timing:  Constant Chronicity:  Chronic Relieved by:  Nothing Worsened by:  Nothing tried Ineffective treatments:  None tried Associated symptoms: dysuria and vaginal discharge   Associated symptoms: no chest pain, no chills, no constipation, no diarrhea, no fever, no hematuria, no nausea, no shortness of breath, no vaginal bleeding and no vomiting   Risk factors: has not had multiple surgeries and not pregnant     Past Medical History   Diagnosis Date  . Anxiety   . Hemorrhoids   . Bladder pain   . History of pelvic inflammatory disease   . History of gastritis     mild per EGD 03-26-2015  . Major depressive disorder (HCC)   . History of panic attacks   . Urgency of urination   . GERD (gastroesophageal reflux disease)   . Vitamin D deficiency   . Thyroid disease     "TSH sometimes goes down" per pt   Past Surgical History  Procedure Laterality Date  . Esophagogastroduodenoscopy  03-26-2015  . Cystoscopy with hydrodistension and biopsy N/A 06/27/2015    Procedure: CYSTOSCOPY/BLADDER BIOPSY/HYDRODISTENSION AND  INSTILLATION OF MARCAINE AND PYRIDIUM;  Surgeon: Ihor Gully, MD;  Location: [facility];  Service: Urology;  Laterality: N/A;  . Upper gastrointestinal endoscopy     Family History  Problem Relation Age of Onset  . Colon cancer Neg Hx   . Esophageal cancer Neg Hx   . Rectal cancer Neg Hx   . Stomach cancer Neg Hx   . Kidney disease Maternal Aunt   . Heart disease Maternal Aunt   . Gallbladder disease Maternal Aunt    Social History  Substance Use Topics  . Smoking status: Never Smoker   . Smokeless tobacco: Never Used  . Alcohol Use: No   OB History    No data available     Review of Systems  Constitutional: Positive for unexpected weight change. Negative for fever and chills.  Respiratory: Negative for shortness of breath.   Cardiovascular: Negative for chest pain, palpitations and leg swelling.  Gastrointestinal: Positive for abdominal pain.  Negative for nausea, vomiting, diarrhea, constipation and abdominal distention.  Genitourinary: Positive for dysuria, frequency, flank pain (left) and vaginal discharge. Negative for hematuria, decreased urine volume, vaginal bleeding, genital sores and vaginal pain.  Neurological: Negative for dizziness, speech difficulty, light-headedness and headaches.  All other systems reviewed and are negative.     Allergies  Almond oil;  Doxycycline; and Peanuts  Home Medications   Prior to Admission medications   Medication Sig Start Date End Date Taking? Authorizing Provider  HYDROcodone-acetaminophen (NORCO) 10-325 MG tablet Take 1 tablet by mouth every 12 (twelve) hours as needed (pain).   Yes Historical Provider, MD  ibuprofen (ADVIL,MOTRIN) 200 MG tablet Take 200 mg by mouth every 6 (six) hours as needed (pain).   Yes Historical Provider, MD  dicyclomine (BENTYL) 10 MG capsule Take one twice daily for bowels Patient not taking: Reported on 09/17/2015 07/21/15   Peyton Najjaravid H Hopper, MD  lactulose (CHRONULAC) 10 GM/15ML solution Take 15 mLs (10 g total) by mouth 2 (two) times daily as needed for mild constipation. Patient not taking: Reported on 10/18/2015 09/17/15   Jaclyn ShaggyEnobong Amao, MD  LORazepam (ATIVAN) 1 MG tablet Take 1 tablet (1 mg total) by mouth 2 (two) times daily. Prn anxiety Patient not taking: Reported on 09/17/2015 08/04/15   Linna HoffJames D Kindl, MD  pantoprazole (PROTONIX) 20 MG tablet Take 1 tablet (20 mg total) by mouth daily. Patient not taking: Reported on 10/18/2015 09/17/15   Jaclyn ShaggyEnobong Amao, MD  zolpidem (AMBIEN) 10 MG tablet Take 1 tablet (10 mg total) by mouth at bedtime as needed for sleep. 08/04/15 09/03/15  Linna HoffJames D Kindl, MD   BP 96/69 mmHg  Pulse 66  Temp(Src) 98.5 F (36.9 C) (Oral)  Resp 16  Ht 5' (1.524 m)  Wt 39.009 kg  BMI 16.80 kg/m2  SpO2 100%  LMP 10/12/2015 Physical Exam  Constitutional: She appears well-developed and well-nourished. No distress.  HENT:  Head: Normocephalic and atraumatic.  Cardiovascular: Normal rate, regular rhythm, normal heart sounds and intact distal pulses.  Exam reveals no gallop and no friction rub.   No murmur heard. Pulmonary/Chest: Effort normal and breath sounds normal. No respiratory distress. She has no wheezes. She has no rales. She exhibits no tenderness.  Abdominal: Soft. Bowel sounds are normal. She exhibits no distension and no mass. There is no tenderness.  There is no rebound and no guarding.  Genitourinary: Vagina normal. Uterus is tender. Uterus is not deviated, not enlarged and not fixed. Cervix exhibits no motion tenderness, no discharge and no friability. Right adnexum displays tenderness. Right adnexum displays no mass and no fullness. Left adnexum displays no mass, no tenderness and no fullness. No bleeding in the vagina. No vaginal discharge found.  Lymphadenopathy:    She has no cervical adenopathy.       Right: No inguinal adenopathy present.       Left: No inguinal adenopathy present.  Skin: Skin is warm and dry. She is not diaphoretic.  Nursing note and vitals reviewed.   ED Course  Procedures (including critical care time) Labs Review Labs Reviewed  WET PREP, GENITAL - Abnormal; Notable for the following:    WBC, Wet Prep HPF POC MANY (*)    All other components within normal limits  URINALYSIS, ROUTINE W REFLEX MICROSCOPIC (NOT AT [facility]) - Abnormal; Notable for the following:    APPearance HAZY (*)    All other components within normal limits  LIPASE, BLOOD  COMPREHENSIVE METABOLIC PANEL  CBC  RPR  I-STAT BETA HCG BLOOD, ED ([facility], [facility], [facility] ONLY)  GC/CHLAMYDIA PROBE AMP ([place]) NOT AT [facility]    Imaging Review No results found. I have personally reviewed and evaluated these images and lab results as part of my medical decision-making.   EKG Interpretation None      MDM   Final diagnoses:  Dysuria  Abdominal pain, lower   29 year old [place] female with chronic lower abdominal pain since April who presents with increased frequency of urination, consistent lower abdominal fullness and now left flank pain. See history of present illness for details. On exam NAD, AF VSS. Abdomen soft nontender. Pelvic exam w/no masses. Slightly tender over the uterus . Workup negative- no sign of UTI or pyelo. Not consistent with ectopic or torsion. Patient has been abstinent since October, STI suspicion low: has already been checked  for STIs including HIV and syphilis: negative. Wet prep w/no abnormalities today. GC chlamydia sent off but do not suspect STI so was not prophylactically treated.   Given extensive workup (most recently was Dec. 18 at [facility] w/CT abd/pelvis w/contrast and pelvis US) and benign exam do not feel she needs any imaging. Encouraged her to FU w/urology and ob/gyn as this may be atypical presentation for interstitial cystitis or endometriosis?   Pt was seen under the supervision of Dr. Silverio Lay.     Rachelle Hora, MD 10/19/15 [id number]  Richardean Canal, MD 10/21/15 [phone]

## 2015-10-18 NOTE — ED Notes (Signed)
Pt st's she has been having lower abd pain since

## 2015-10-19 LAB — RPR: RPR Ser Ql: NONREACTIVE

## 2015-10-19 MED ORDER — IBUPROFEN 400 MG PO TABS
600.0000 mg | ORAL_TABLET | Freq: Once | ORAL | Status: AC
  Administered 2015-10-19: 600 mg via ORAL
  Filled 2015-10-19: qty 1

## 2015-10-24 LAB — GC/CHLAMYDIA PROBE AMP (~~LOC~~) NOT AT ARMC
Chlamydia: NEGATIVE
Neisseria Gonorrhea: NEGATIVE

## 2015-11-07 ENCOUNTER — Encounter (HOSPITAL_BASED_OUTPATIENT_CLINIC_OR_DEPARTMENT_OTHER): Payer: Self-pay | Admitting: *Deleted

## 2015-11-07 ENCOUNTER — Other Ambulatory Visit: Payer: Self-pay | Admitting: Urology

## 2015-11-08 ENCOUNTER — Emergency Department (HOSPITAL_COMMUNITY)
Admission: EM | Admit: 2015-11-08 | Discharge: 2015-11-09 | Disposition: A | Discharge status: Home / Self Care | Payer: No Typology Code available for payment source | Attending: Emergency Medicine | Admitting: Emergency Medicine

## 2015-11-08 ENCOUNTER — Encounter (HOSPITAL_COMMUNITY): Payer: Self-pay

## 2015-11-08 DIAGNOSIS — F329 Major depressive disorder, single episode, unspecified: Secondary | ICD-10-CM | POA: Insufficient documentation

## 2015-11-08 DIAGNOSIS — K219 Gastro-esophageal reflux disease without esophagitis: Secondary | ICD-10-CM | POA: Diagnosis not present

## 2015-11-08 DIAGNOSIS — N390 Urinary tract infection, site not specified: Secondary | ICD-10-CM | POA: Insufficient documentation

## 2015-11-08 DIAGNOSIS — F419 Anxiety disorder, unspecified: Secondary | ICD-10-CM | POA: Diagnosis not present

## 2015-11-08 DIAGNOSIS — Z79899 Other long term (current) drug therapy: Secondary | ICD-10-CM | POA: Insufficient documentation

## 2015-11-08 DIAGNOSIS — R300 Dysuria: Secondary | ICD-10-CM | POA: Diagnosis present

## 2015-11-08 DIAGNOSIS — Z8639 Personal history of other endocrine, nutritional and metabolic disease: Secondary | ICD-10-CM | POA: Insufficient documentation

## 2015-11-08 DIAGNOSIS — Z3202 Encounter for pregnancy test, result negative: Secondary | ICD-10-CM | POA: Diagnosis not present

## 2015-11-08 DIAGNOSIS — F410 Panic disorder [episodic paroxysmal anxiety] without agoraphobia: Secondary | ICD-10-CM | POA: Diagnosis not present

## 2015-11-08 LAB — URINALYSIS, ROUTINE W REFLEX MICROSCOPIC
GLUCOSE, UA: NEGATIVE mg/dL
HGB URINE DIPSTICK: NEGATIVE
KETONES UR: 15 mg/dL — AB
Nitrite: POSITIVE — AB
PH: 6.5 (ref 5.0–8.0)
Protein, ur: NEGATIVE mg/dL
SPECIFIC GRAVITY, URINE: 1.013 (ref 1.005–1.030)

## 2015-11-08 LAB — COMPREHENSIVE METABOLIC PANEL
ALT: 58 U/L — AB (ref 14–54)
AST: 44 U/L — ABNORMAL HIGH (ref 15–41)
Albumin: 4 g/dL (ref 3.5–5.0)
Alkaline Phosphatase: 73 U/L (ref 38–126)
Anion gap: 8 (ref 5–15)
BILIRUBIN TOTAL: 1 mg/dL (ref 0.3–1.2)
BUN: 11 mg/dL (ref 6–20)
CHLORIDE: 105 mmol/L (ref 101–111)
CO2: 26 mmol/L (ref 22–32)
CREATININE: 0.64 mg/dL (ref 0.44–1.00)
Calcium: 8.8 mg/dL — ABNORMAL LOW (ref 8.9–10.3)
Glucose, Bld: 85 mg/dL (ref 65–99)
POTASSIUM: 3.9 mmol/L (ref 3.5–5.1)
Sodium: 139 mmol/L (ref 135–145)
TOTAL PROTEIN: 6.6 g/dL (ref 6.5–8.1)

## 2015-11-08 LAB — LIPASE, BLOOD: LIPASE: 43 U/L (ref 11–51)

## 2015-11-08 LAB — I-STAT BETA HCG BLOOD, ED (MC, WL, AP ONLY): I-stat hCG, quantitative: <5 m[IU]/mL (ref <5)

## 2015-11-08 LAB — CBC
HEMATOCRIT: 35.7 % — AB (ref 36.0–46.0)
HEMOGLOBIN: 12 g/dL (ref 12.0–15.0)
MCH: 30.3 pg (ref 26.0–34.0)
MCHC: 33.6 g/dL (ref 30.0–36.0)
MCV: 90.2 fL (ref 78.0–100.0)
Platelets: 246 10*3/uL (ref 150–400)
RBC: 3.96 MIL/uL (ref 3.87–5.11)
RDW: 12 % (ref 11.5–15.5)
WBC: 5.8 10*3/uL (ref 4.0–10.5)

## 2015-11-08 LAB — URINE MICROSCOPIC-ADD ON
BACTERIA UA: NONE SEEN
RBC / HPF: NONE SEEN RBC/hpf (ref 0–5)

## 2015-11-08 MED ORDER — CEPHALEXIN 500 MG PO CAPS
500.0000 mg | ORAL_CAPSULE | Freq: Four times a day (QID) | ORAL | Status: DC
Start: 2015-11-08 — End: 2015-11-18

## 2015-11-08 MED ORDER — CEPHALEXIN 250 MG PO CAPS
500.0000 mg | ORAL_CAPSULE | Freq: Once | ORAL | Status: AC
  Administered 2015-11-09: 500 mg via ORAL
  Filled 2015-11-08: qty 2

## 2015-11-08 NOTE — ED Notes (Signed)
Pt reports she has been having trouble urinating and when she is able to it hurts, ongoing for the past 3 days and she reports her lower abdomen feels swollen. She has a urethral dilation scheduled for Friday. Pain to lower abdomen to where she cant sit or stand comfortably.

## 2015-11-08 NOTE — ED Provider Notes (Signed)
CSN: [id number]     Arrival date & time 11/08/15  2027 History   First MD Initiated Contact with Patient 11/08/15 2339     Chief Complaint  Patient presents with  . Dysuria     (Consider location/radiation/quality/duration/timing/severity/associated sxs/prior Treatment) Patient is a 30 y.o. female presenting with dysuria. The history is provided by the patient. No language interpreter was used.  Dysuria Pain quality:  Burning Pain severity:  Moderate Onset quality:  Gradual Duration:  3 days Timing:  Constant Associated symptoms: nausea   Associated symptoms: no fever, no flank pain and no vomiting   Associated symptoms comment:  Patient with a history of UTI and urethral stricture requiring dilation presents with urinary frequency, burning with urination and suprapubic discomfort for 3 days. She started using her pyridium yesterday and reports that the burning is improved. No flank pain or fever. She has had nausea without vomiting. She is scheduled for another urethral dilation on 11/15/15.   Past Medical History  Diagnosis Date  . Anxiety   . Bladder pain   . History of pelvic inflammatory disease   . History of gastritis     mild per EGD 03-26-2015  . Major depressive disorder (HCC)   . History of panic attacks   . Urgency of urination   . GERD (gastroesophageal reflux disease)   . Vitamin D deficiency   . Thyroid disease     "TSH sometimes goes down" per pt   Past Surgical History  Procedure Laterality Date  . Esophagogastroduodenoscopy  03-26-2015  . Cystoscopy with hydrodistension and biopsy N/A 06/27/2015    Procedure: CYSTOSCOPY/BLADDER BIOPSY/HYDRODISTENSION AND  INSTILLATION OF MARCAINE AND PYRIDIUM;  Surgeon: Ihor Gully, MD;  Location: [facility];  Service: Urology;  Laterality: N/A;   Family History  Problem Relation Age of Onset  . Colon cancer Neg Hx   . Esophageal cancer Neg Hx   . Rectal cancer Neg Hx   . Stomach cancer Neg Hx   .  Kidney disease Maternal Aunt   . Heart disease Maternal Aunt   . Gallbladder disease Maternal Aunt    Social History  Substance Use Topics  . Smoking status: Never Smoker   . Smokeless tobacco: Never Used  . Alcohol Use: No   OB History    No data available     Review of Systems  Constitutional: Negative for fever and chills.  Gastrointestinal: Positive for nausea. Negative for vomiting.  Genitourinary: Positive for dysuria and frequency. Negative for hematuria and flank pain.  Neurological: Negative for weakness.      Allergies  Almond oil; Doxycycline; and Peanuts  Home Medications   Prior to Admission medications   Medication Sig Start Date End Date Taking? Authorizing Provider  dicyclomine (BENTYL) 10 MG capsule Take one twice daily for bowels Patient not taking: Reported on 09/17/2015 07/21/15   Peyton Najjar, MD  HYDROcodone-acetaminophen [facility]) 10-325 MG tablet Take 1 tablet by mouth every 12 (twelve) hours as needed (pain).    Historical Provider, MD  ibuprofen (ADVIL,MOTRIN) 200 MG tablet Take 200 mg by mouth every 6 (six) hours as needed (pain).    Historical Provider, MD  lactulose (CHRONULAC) 10 GM/15ML solution Take 15 mLs (10 g total) by mouth 2 (two) times daily as needed for mild constipation. Patient not taking: Reported on 10/18/2015 09/17/15   Jaclyn Shaggy, MD  LORazepam (ATIVAN) 1 MG tablet Take 1 tablet (1 mg total) by mouth 2 (two) times daily. Prn anxiety Patient  not taking: Reported on 09/17/2015 08/04/15   Linna HoffJames D Kindl, MD  pantoprazole (PROTONIX) 20 MG tablet Take 1 tablet (20 mg total) by mouth daily. Patient not taking: Reported on 10/18/2015 09/17/15   Jaclyn ShaggyEnobong Amao, MD  zolpidem (AMBIEN) 10 MG tablet Take 1 tablet (10 mg total) by mouth at bedtime as needed for sleep. 08/04/15 09/03/15  Linna HoffJames D Kindl, MD   BP 106/65 mmHg  Pulse 84  Temp(Src) 98.1 F (36.7 C) (Oral)  Resp 16  SpO2 100%  LMP 10/12/2015 Physical Exam  Constitutional: She is  oriented to person, place, and time. She appears well-developed and well-nourished.  Neck: Normal range of motion.  Pulmonary/Chest: Effort normal.  Abdominal: She exhibits no mass.  Suprapubic tenderness without distention.  Musculoskeletal: Normal range of motion.  Neurological: She is alert and oriented to person, place, and time.  Skin: Skin is warm and dry.    ED Course  Procedures (including critical care time) Labs Review Labs Reviewed  COMPREHENSIVE METABOLIC PANEL - Abnormal; Notable for the following:    Calcium 8.8 (*)    AST 44 (*)    ALT 58 (*)    All other components within normal limits  CBC - Abnormal; Notable for the following:    HCT 35.7 (*)    All other components within normal limits  URINALYSIS, ROUTINE W REFLEX MICROSCOPIC (NOT AT [facility]) - Abnormal; Notable for the following:    Color, Urine ORANGE (*)    Bilirubin Urine SMALL (*)    Ketones, ur 15 (*)    Nitrite POSITIVE (*)    Leukocytes, UA SMALL (*)    All other components within normal limits  URINE MICROSCOPIC-ADD ON - Abnormal; Notable for the following:    Squamous Epithelial / LPF 0-5 (*)    All other components within normal limits  LIPASE, BLOOD  I-STAT BETA HCG BLOOD, ED ([facility], [facility], [facility] ONLY)   Results for orders placed or performed during the hospital encounter of 11/08/15  Lipase, blood  Result Value Ref Range   Lipase 43 11 - 51 U/L  Comprehensive metabolic panel  Result Value Ref Range   Sodium 139 135 - 145 mmol/L   Potassium 3.9 3.5 - 5.1 mmol/L   Chloride 105 101 - 111 mmol/L   CO2 26 22 - 32 mmol/L   Glucose, Bld 85 65 - 99 mg/dL   BUN 11 6 - 20 mg/dL   Creatinine, Ser 1.610.64 0.44 - 1.00 mg/dL   Calcium 8.8 (L) 8.9 - 10.3 mg/dL   Total Protein 6.6 6.5 - 8.1 g/dL   Albumin 4.0 3.5 - 5.0 g/dL   AST 44 (H) 15 - 41 U/L   ALT 58 (H) 14 - 54 U/L   Alkaline Phosphatase 73 38 - 126 U/L   Total Bilirubin 1.0 0.3 - 1.2 mg/dL   GFR calc non Af Amer >60 >60 mL/min   GFR calc Af Amer >60  >60 mL/min   Anion gap 8 5 - 15  CBC  Result Value Ref Range   WBC 5.8 4.0 - 10.5 K/uL   RBC 3.96 3.87 - 5.11 MIL/uL   Hemoglobin 12.0 12.0 - 15.0 g/dL   HCT 09.635.7 (L) 04.536.0 - 40.946.0 %   MCV 90.2 78.0 - 100.0 fL   MCH 30.3 26.0 - 34.0 pg   MCHC 33.6 30.0 - 36.0 g/dL   RDW 81.112.0 91.411.5 - 78.215.5 %   Platelets 246 150 - 400 K/uL  Urinalysis, Routine w reflex  microscopic (not at [facility])  Result Value Ref Range   Color, Urine ORANGE (A) YELLOW   APPearance CLEAR CLEAR   Specific Gravity, Urine 1.013 1.005 - 1.030   pH 6.5 5.0 - 8.0   Glucose, UA NEGATIVE NEGATIVE mg/dL   Hgb urine dipstick NEGATIVE NEGATIVE   Bilirubin Urine SMALL (A) NEGATIVE   Ketones, ur 15 (A) NEGATIVE mg/dL   Protein, ur NEGATIVE NEGATIVE mg/dL   Nitrite POSITIVE (A) NEGATIVE   Leukocytes, UA SMALL (A) NEGATIVE  Urine microscopic-add on  Result Value Ref Range   Squamous Epithelial / LPF 0-5 (A) NONE SEEN   WBC, UA 0-5 0 - 5 WBC/hpf   RBC / HPF NONE SEEN 0 - 5 RBC/hpf   Bacteria, UA NONE SEEN NONE SEEN  I-Stat beta hCG blood, ED ([facility], [facility], [facility] only)  Result Value Ref Range   I-stat hCG, quantitative <5.0 <5 mIU/mL   Comment 3             Imaging Review No results found. I have personally reviewed and evaluated these images and lab results as part of my medical decision-making.   EKG Interpretation None      MDM   Final diagnoses:  None    1. UTI  Patient has nitrite positive urinalysis with symptoms supporting UTI. Bladder scan performed to insure she was not retaining urine and post-void scan shows less than 50 cc urine in bladder. She is non-toxic appearing, normal vital signs without history of fever or flank pain. She has outpatient follow up with urology later this week. Culture on UA pending for follow up with urology. She is felt stable for discharge. Keflex, pyridium Rx's provided.     Elpidio Anis, PA-C 11/09/15 0120  Zadie Rhine, MD 11/09/15 [phone]

## 2015-11-08 NOTE — Discharge Instructions (Signed)

## 2015-11-09 ENCOUNTER — Ambulatory Visit (INDEPENDENT_AMBULATORY_CARE_PROVIDER_SITE_OTHER): Payer: No Typology Code available for payment source | Admitting: Family Medicine

## 2015-11-09 VITALS — BP 102/60 | HR 84 | Temp 97.7°F | Resp 16 | Ht 60.0 in | Wt 85.6 lb

## 2015-11-09 DIAGNOSIS — N9489 Other specified conditions associated with female genital organs and menstrual cycle: Secondary | ICD-10-CM

## 2015-11-09 DIAGNOSIS — R102 Pelvic and perineal pain: Secondary | ICD-10-CM

## 2015-11-09 DIAGNOSIS — N760 Acute vaginitis: Secondary | ICD-10-CM

## 2015-11-09 LAB — POC MICROSCOPIC URINALYSIS (UMFC)

## 2015-11-09 LAB — POCT URINALYSIS DIP (MANUAL ENTRY)
Bilirubin, UA: NEGATIVE
Blood, UA: NEGATIVE
Glucose, UA: NEGATIVE
Ketones, POC UA: NEGATIVE
Leukocytes, UA: NEGATIVE
Nitrite, UA: NEGATIVE
Protein Ur, POC: NEGATIVE
Spec Grav, UA: >=1.03
Urobilinogen, UA: 0.2
pH, UA: 5.5

## 2015-11-09 LAB — POCT WET + KOH PREP
Trich by wet prep: ABSENT
Yeast by KOH: ABSENT
Yeast by wet prep: ABSENT

## 2015-11-09 MED ORDER — FLUCONAZOLE 150 MG PO TABS: 150.0000 mg | ORAL_TABLET | Freq: Once | ORAL | Status: DC

## 2015-11-09 MED ORDER — PHENAZOPYRIDINE HCL 200 MG PO TABS: 200.0000 mg | ORAL_TABLET | Freq: Three times a day (TID) | ORAL | Status: DC

## 2015-11-09 NOTE — Patient Instructions (Signed)
Your pelvic exam is normal. He did not have any sign of PID. You do not have any sign of a urinary tract infection. The on antibiotic predisposes you to having a yeast infection, and although we do not see definite signs of a yeast infection, the vaginal itching is certainly suggestive of a beginning use infection.  Therefore I'll you need to do is take 1 Diflucan. I'm referring her to the gynecologist for further evaluation of the pelvic discomfort.

## 2015-11-09 NOTE — Progress Notes (Signed)
This chart was scribed for [name], MD by [facility] Hospital[facility]adim Abu Hashem, medical scribe at [facility].The patient was seen in exam room 08 and the patient's care was started at 4:03 PM.  Patient ID: Dawn Price MRN: [id number], DOB: 1986-06-28, 30 y.o. Date of Encounter: 11/09/2015  Primary Physician: Janace HoardHOPPER,DAVID, MD  Chief Complaint:  Chief Complaint  Patient presents with  . Vaginal Itching    and swelling  . Abdominal Pain    x 2 days    HPI:  Dawn Price is a 30 y.o. female who presents to [facility] complaining of vaginal itching and swelling. In addition she has had pelvic and lower abdominal pain for the past two days. She has Hx of PID, and has not had intercourse since October. Swelling in the right axilla which has been uncomfortable for the past two weeks.   Past Medical History  Diagnosis Date  . Anxiety   . Bladder pain   . History of pelvic inflammatory disease   . History of gastritis     mild per EGD 03-26-2015  . Major depressive disorder (HCC)   . History of panic attacks   . Urgency of urination   . GERD (gastroesophageal reflux disease)   . Vitamin D deficiency   . Thyroid disease     "TSH sometimes goes down" per pt     Home Meds: Prior to Admission medications   Medication Sig Start Date End Date Taking? Authorizing Provider  cephALEXin (KEFLEX) 500 MG capsule Take 1 capsule (500 mg total) by mouth 4 (four) times daily. 11/08/15  Yes Shari Upstill, PA-C  pantoprazole (PROTONIX) 20 MG tablet Take 1 tablet (20 mg total) by mouth daily. 09/17/15  Yes Jaclyn ShaggyEnobong Amao, MD  dicyclomine (BENTYL) 10 MG capsule Take one twice daily for bowels Patient not taking: Reported on 09/17/2015 07/21/15   Peyton Najjaravid H Hopper, MD  HYDROcodone-acetaminophen [facility](NORCO) 10-325 MG tablet Take 1 tablet by mouth every 12 (twelve) hours as needed (pain). Reported on 11/09/2015    Historical Provider, MD  ibuprofen (ADVIL,MOTRIN) 200 MG tablet  Take 200 mg by mouth every 6 (six) hours as needed (pain). Reported on 11/09/2015    Historical Provider, MD  lactulose (CHRONULAC) 10 GM/15ML solution Take 15 mLs (10 g total) by mouth 2 (two) times daily as needed for mild constipation. Patient not taking: Reported on 10/18/2015 09/17/15   Jaclyn ShaggyEnobong Amao, MD  LORazepam (ATIVAN) 1 MG tablet Take 1 tablet (1 mg total) by mouth 2 (two) times daily. Prn anxiety Patient not taking: Reported on 09/17/2015 08/04/15   Linna HoffJames D Kindl, MD  phenazopyridine (PYRIDIUM) 200 MG tablet Take 1 tablet (200 mg total) by mouth 3 (three) times daily. Patient not taking: Reported on 11/09/2015 11/09/15   Elpidio AnisShari Upstill, PA-C  zolpidem (AMBIEN) 10 MG tablet Take 1 tablet (10 mg total) by mouth at bedtime as needed for sleep. 08/04/15 09/03/15  Linna HoffJames D Kindl, MD    Allergies:  Allergies  Allergen Reactions  . Almond Oil Rash  . Doxycycline Nausea Only  . Peanuts [Peanut Oil] Rash    Social History   Social History  . Marital Status: Single    Spouse Name: N/A  . Number of Children: 0  . Years of Education: N/A   Occupational History  . Doctor-PHD [employer]    Social History Main Topics  . Smoking status: Never Smoker   . Smokeless tobacco: Never Used  . Alcohol Use: No  .  Drug Use: No  . Sexual Activity: Not on file   Other Topics Concern  . Not on file   Social History Narrative   Marital status: engaged      Children: none      Lives: alone      Employment: completed PhD; working at [employer]; postdoctorate degree      Tobacco: none      Alcohol: socially      Exercise:  Not currently; before twice per week.      Review of Systems: Constitutional: negative for chills, fever, night sweats, weight changes, or fatigue  HEENT: negative for vision changes, hearing loss, congestion, rhinorrhea, ST, epistaxis, or sinus pressure Cardiovascular: negative for chest pain or palpitations Respiratory: negative for hemoptysis, wheezing, shortness of breath, or  cough Abdominal: negative for nausea, vomiting, diarrhea, or constipation. Positive for abdominal pain. Genitourinary: Positive for vaginal itching and swelling. Dermatological: negative for rash Neurologic: negative for headache, dizziness, or syncope All other systems reviewed and are otherwise negative with the exception to those above and in the HPI.  Physical Exam: Blood pressure 102/60, pulse 84, temperature 97.7 F (36.5 C), temperature source Oral, resp. rate 16, height 5' (1.524 m), weight 85 lb 9.6 oz (38.828 kg), last menstrual period 10/12/2015, SpO2 98 %., Body mass index is 16.72 kg/(m^2). General: Well developed, well nourished, in no acute distress. Head: Normocephalic, atraumatic, eyes without discharge, sclera non-icteric, nares are without discharge. Bilateral auditory canals clear, TM's are without perforation, pearly grey and translucent with reflective cone of light bilaterally. Oral cavity moist, posterior pharynx without exudate, erythema, peritonsillar abscess, or post nasal drip.  Neck: Supple. No thyromegaly. Full ROM. No lymphadenopathy. Lungs: Clear bilaterally to auscultation without wheezes, rales, or rhonchi. Breathing is unlabored. Heart: RRR with S1 S2. No murmurs, rubs, or gallops appreciated. Abdomen: Soft, non-tender, non-distended with normoactive bowel sounds. No hepatomegaly. No rebound/guarding. No obvious abdominal masses. Msk:  Strength and tone normal for age. Extremities/Skin: Warm and dry. No clubbing or cyanosis. No edema. No rashes or suspicious lesions. Neuro: Alert and oriented X 3. Moves all extremities spontaneously. Gait is normal. CNII-XII grossly in tact. Psych:  Responds to questions appropriately with a normal affect.  Pelvic exam: Normal external genitalia. Hypersensitive introitus, normal vagina and cervix, normal sized and shaped uterus, no adnexal masses Labs: Results for orders placed or performed in visit on 11/09/15  POCT  urinalysis dipstick  Result Value Ref Range   Color, UA yellow yellow   Clarity, UA clear clear   Glucose, UA negative negative   Bilirubin, UA negative negative   Ketones, POC UA negative negative   Spec Grav, UA >=1.030    Blood, UA negative negative   pH, UA 5.5    Protein Ur, POC negative negative   Urobilinogen, UA 0.2    Nitrite, UA Negative Negative   Leukocytes, UA Negative Negative  POCT Microscopic Urinalysis ([facility])  Result Value Ref Range   WBC,UR,HPF,POC None None WBC/hpf   RBC,UR,HPF,POC None None RBC/hpf   Bacteria Few (A) None, Too numerous to count   Mucus Present (A) Absent   Epithelial Cells, UR Per Microscopy Few (A) None, Too numerous to count cells/hpf  POCT Wet + KOH Prep  Result Value Ref Range   Yeast by KOH Absent Present, Absent   Yeast by wet prep Absent Present, Absent   WBC by wet prep None None, Few, Too numerous to count   Clue Cells Wet Prep HPF POC None None, Too  numerous to count   Trich by wet prep Absent Present, Absent   Bacteria Wet Prep HPF POC Few None, Few, Too numerous to count   Epithelial Cells By [employer] Pref ([facility]) Few None, Few, Too numerous to count   RBC,UR,HPF,POC None None RBC/hpf     ASSESSMENT AND PLAN:  30 y.o. year old female with symptoms of a yeast infection without definite proof on wet prep. No sign of urinary tract infection at this point. I will treat for yeast infection but I think patient has chronic pelvic pain which may be related to her anxiety state. I think a gynecology referral will help relieve her of any concerns about PID.  By signing my name below, I, Nadim Abuhashem, attest that this documentation has been prepared under the direction and in the presence of Elvina Sidle, MD.  Electronically Signed: Conchita Paris, medical scribe. 11/09/2015 4:16 PM.  This chart was scribed in my presence and reviewed by me personally.    ICD-9-CM ICD-10-CM   1. Vaginitis and vulvovaginitis 616.10 N76.0 POCT urinalysis  dipstick     POCT Microscopic Urinalysis ([facility])     GC/Chlamydia Probe Amp     POCT Wet + KOH Prep     Ambulatory referral to Gynecology     fluconazole (DIFLUCAN) 150 MG tablet  2. Pelvic pressure in female 625.8 N94.89 POCT urinalysis dipstick     POCT Microscopic Urinalysis ([facility])     GC/Chlamydia Probe Amp     POCT Wet + KOH Prep     Ambulatory referral to Gynecology     Signed, Elvina Sidle, MD

## 2015-11-10 ENCOUNTER — Encounter (HOSPITAL_BASED_OUTPATIENT_CLINIC_OR_DEPARTMENT_OTHER): Payer: Self-pay | Admitting: *Deleted

## 2015-11-10 LAB — URINE CULTURE: SPECIAL REQUESTS: NORMAL

## 2015-11-10 NOTE — Progress Notes (Addendum)
NPO AFTER MN.  ARRIVE AT 0745  CURRENT LAB RESULTS IN CHART AND EPIC.

## 2015-11-11 LAB — GC/CHLAMYDIA PROBE AMP
CT Probe RNA: NOT DETECTED
GC Probe RNA: NOT DETECTED

## 2015-11-14 ENCOUNTER — Ambulatory Visit (HOSPITAL_BASED_OUTPATIENT_CLINIC_OR_DEPARTMENT_OTHER): Payer: PRIVATE HEALTH INSURANCE | Admitting: Anesthesiology

## 2015-11-14 ENCOUNTER — Encounter (HOSPITAL_BASED_OUTPATIENT_CLINIC_OR_DEPARTMENT_OTHER)
Admission: RE | Disposition: A | Discharge status: Home / Self Care | Payer: Self-pay | Source: Ambulatory Visit | Attending: Urology

## 2015-11-14 ENCOUNTER — Ambulatory Visit (HOSPITAL_BASED_OUTPATIENT_CLINIC_OR_DEPARTMENT_OTHER)
Admission: RE | Admit: 2015-11-14 | Discharge: 2015-11-14 | Disposition: A | Discharge status: Home / Self Care | Payer: PRIVATE HEALTH INSURANCE | Source: Ambulatory Visit | Attending: Urology | Admitting: Urology

## 2015-11-14 ENCOUNTER — Encounter (HOSPITAL_BASED_OUTPATIENT_CLINIC_OR_DEPARTMENT_OTHER): Payer: Self-pay | Admitting: Anesthesiology

## 2015-11-14 DIAGNOSIS — E039 Hypothyroidism, unspecified: Secondary | ICD-10-CM | POA: Diagnosis not present

## 2015-11-14 DIAGNOSIS — Z791 Long term (current) use of non-steroidal anti-inflammatories (NSAID): Secondary | ICD-10-CM | POA: Diagnosis not present

## 2015-11-14 DIAGNOSIS — K219 Gastro-esophageal reflux disease without esophagitis: Secondary | ICD-10-CM | POA: Diagnosis not present

## 2015-11-14 DIAGNOSIS — Z79899 Other long term (current) drug therapy: Secondary | ICD-10-CM | POA: Diagnosis not present

## 2015-11-14 DIAGNOSIS — N359 Urethral stricture, unspecified: Secondary | ICD-10-CM | POA: Insufficient documentation

## 2015-11-14 DIAGNOSIS — Z79891 Long term (current) use of opiate analgesic: Secondary | ICD-10-CM | POA: Insufficient documentation

## 2015-11-14 DIAGNOSIS — Z87440 Personal history of urinary (tract) infections: Secondary | ICD-10-CM | POA: Diagnosis not present

## 2015-11-14 DIAGNOSIS — IMO0002 Reserved for concepts with insufficient information to code with codable children: Secondary | ICD-10-CM

## 2015-11-14 HISTORY — DX: Dysuria: R30.0

## 2015-11-14 HISTORY — DX: Reserved for concepts with insufficient information to code with codable children: IMO0002

## 2015-11-14 HISTORY — DX: Candidiasis, unspecified: B37.9

## 2015-11-14 HISTORY — PX: CYSTOSCOPY WITH URETHRAL DILATATION: SHX5125

## 2015-11-14 HISTORY — DX: Urinary tract infection, site not specified: N39.0

## 2015-11-14 SURGERY — CYSTOSCOPY, WITH URETHRAL DILATION
Anesthesia: Monitor Anesthesia Care | Site: Urethra

## 2015-11-14 MED ORDER — IOHEXOL 350 MG/ML SOLN
INTRAVENOUS | Status: DC | PRN
  Administered 2015-11-14: 5 mL

## 2015-11-14 MED ORDER — IBUPROFEN 200 MG PO TABS
ORAL_TABLET | ORAL | Status: AC
  Filled 2015-11-14: qty 1

## 2015-11-14 MED ORDER — LIDOCAINE HCL (CARDIAC) 20 MG/ML IV SOLN
INTRAVENOUS | Status: AC
  Filled 2015-11-14: qty 5

## 2015-11-14 MED ORDER — PROPOFOL 500 MG/50ML IV EMUL
INTRAVENOUS | Status: AC
  Filled 2015-11-14: qty 50

## 2015-11-14 MED ORDER — PHENAZOPYRIDINE HCL 100 MG PO TABS
ORAL_TABLET | ORAL | Status: AC
  Filled 2015-11-14: qty 2

## 2015-11-14 MED ORDER — CIPROFLOXACIN IN D5W 200 MG/100ML IV SOLN
INTRAVENOUS | Status: AC
  Filled 2015-11-14: qty 100

## 2015-11-14 MED ORDER — PROPOFOL 10 MG/ML IV BOLUS
INTRAVENOUS | Status: DC | PRN
  Administered 2015-11-14: 40 mg via INTRAVENOUS

## 2015-11-14 MED ORDER — ONDANSETRON HCL 4 MG/2ML IJ SOLN
INTRAMUSCULAR | Status: AC
  Filled 2015-11-14: qty 2

## 2015-11-14 MED ORDER — PROPOFOL 10 MG/ML IV BOLUS
INTRAVENOUS | Status: AC
  Filled 2015-11-14: qty 20

## 2015-11-14 MED ORDER — IBUPROFEN 200 MG PO TABS
200.0000 mg | ORAL_TABLET | Freq: Four times a day (QID) | ORAL | Status: DC | PRN
  Administered 2015-11-14: 200 mg via ORAL
  Filled 2015-11-14: qty 1

## 2015-11-14 MED ORDER — PROMETHAZINE HCL 25 MG/ML IJ SOLN
6.2500 mg | INTRAMUSCULAR | Status: DC | PRN
  Filled 2015-11-14: qty 1

## 2015-11-14 MED ORDER — PROPOFOL 500 MG/50ML IV EMUL
INTRAVENOUS | Status: DC | PRN
  Administered 2015-11-14: 25 ug/kg/min via INTRAVENOUS

## 2015-11-14 MED ORDER — HYDROMORPHONE HCL 1 MG/ML IJ SOLN
0.2500 mg | INTRAMUSCULAR | Status: DC | PRN
  Filled 2015-11-14: qty 1

## 2015-11-14 MED ORDER — PHENAZOPYRIDINE HCL 200 MG PO TABS
200.0000 mg | ORAL_TABLET | Freq: Three times a day (TID) | ORAL | Status: DC
  Administered 2015-11-14: 200 mg via ORAL
  Filled 2015-11-14: qty 1

## 2015-11-14 MED ORDER — FENTANYL CITRATE (PF) 100 MCG/2ML IJ SOLN
INTRAMUSCULAR | Status: AC
  Filled 2015-11-14: qty 2

## 2015-11-14 MED ORDER — DEXAMETHASONE SODIUM PHOSPHATE 4 MG/ML IJ SOLN
INTRAMUSCULAR | Status: DC | PRN
  Administered 2015-11-14: 10 mg via INTRAVENOUS

## 2015-11-14 MED ORDER — PHENAZOPYRIDINE HCL 200 MG PO TABS: 200.0000 mg | ORAL_TABLET | Freq: Three times a day (TID) | ORAL | Status: DC | PRN

## 2015-11-14 MED ORDER — DEXAMETHASONE SODIUM PHOSPHATE 10 MG/ML IJ SOLN
INTRAMUSCULAR | Status: AC
  Filled 2015-11-14: qty 1

## 2015-11-14 MED ORDER — LACTATED RINGERS IV SOLN
INTRAVENOUS | Status: DC
  Administered 2015-11-14: 09:00:00 via INTRAVENOUS
  Filled 2015-11-14: qty 1000

## 2015-11-14 MED ORDER — HYDROCODONE-ACETAMINOPHEN 10-325 MG PO TABS: 1.0000 | ORAL_TABLET | ORAL | Status: DC | PRN

## 2015-11-14 MED ORDER — STERILE WATER FOR IRRIGATION IR SOLN
Status: DC | PRN
  Administered 2015-11-14: 3000 mL

## 2015-11-14 MED ORDER — CIPROFLOXACIN IN D5W 200 MG/100ML IV SOLN
200.0000 mg | INTRAVENOUS | Status: AC
  Administered 2015-11-14 (×2): 200 mg via INTRAVENOUS
  Filled 2015-11-14: qty 100

## 2015-11-14 MED ORDER — MIDAZOLAM HCL 2 MG/2ML IJ SOLN
INTRAMUSCULAR | Status: AC
  Filled 2015-11-14: qty 2

## 2015-11-14 MED ORDER — ONDANSETRON HCL 4 MG/2ML IJ SOLN
INTRAMUSCULAR | Status: DC | PRN
  Administered 2015-11-14: 4 mg via INTRAVENOUS

## 2015-11-14 MED ORDER — LIDOCAINE HCL (CARDIAC) 20 MG/ML IV SOLN
INTRAVENOUS | Status: DC | PRN
  Administered 2015-11-14: 50 mg via INTRAVENOUS

## 2015-11-14 MED ORDER — MIDAZOLAM HCL 5 MG/5ML IJ SOLN
INTRAMUSCULAR | Status: DC | PRN
  Administered 2015-11-14: 1 mg via INTRAVENOUS

## 2015-11-14 MED ORDER — FENTANYL CITRATE (PF) 100 MCG/2ML IJ SOLN
INTRAMUSCULAR | Status: DC | PRN
  Administered 2015-11-14: 50 ug via INTRAVENOUS

## 2015-11-14 SURGICAL SUPPLY — 31 items
BAG DRAIN URO-CYSTO SKYTR STRL (DRAIN) ×2 IMPLANT
BALLN NEPHROSTOMY (BALLOONS) ×2
BALLOON NEPHROSTOMY (BALLOONS) ×1 IMPLANT
CANISTER SUCT LVC 12 LTR MEDI- (MISCELLANEOUS) IMPLANT
CATH FOLEY 2WAY SLVR  5CC 16FR (CATHETERS)
CATH FOLEY 2WAY SLVR 5CC 16FR (CATHETERS) IMPLANT
CLOTH BEACON ORANGE TIMEOUT ST (SAFETY) ×2 IMPLANT
ELECT REM PT RETURN 9FT ADLT (ELECTROSURGICAL)
ELECTRODE REM PT RTRN 9FT ADLT (ELECTROSURGICAL) IMPLANT
GLOVE BIO SURGEON STRL SZ 6.5 (GLOVE) ×2 IMPLANT
GLOVE BIO SURGEON STRL SZ8 (GLOVE) ×2 IMPLANT
GLOVE BIOGEL PI IND STRL 7.5 (GLOVE) ×1 IMPLANT
GLOVE BIOGEL PI INDICATOR 7.5 (GLOVE) ×1
GLOVE INDICATOR 6.5 STRL GRN (GLOVE) ×2 IMPLANT
GLOVE INDICATOR 7.5 STRL GRN (GLOVE) ×2 IMPLANT
GOWN STRL REUS W/ TWL XL LVL3 (GOWN DISPOSABLE) ×3 IMPLANT
GOWN STRL REUS W/TWL XL LVL3 (GOWN DISPOSABLE) ×3
GOWN XL W/COTTON TOWEL STD (GOWNS) ×2 IMPLANT
GUIDEWIRE STR DUAL SENSOR (WIRE) ×2 IMPLANT
IV NS IRRIG 3000ML ARTHROMATIC (IV SOLUTION) IMPLANT
KIT ROOM TURNOVER WOR (KITS) ×2 IMPLANT
MANIFOLD NEPTUNE II (INSTRUMENTS) IMPLANT
NDL SAFETY ECLIPSE 18X1.5 (NEEDLE) IMPLANT
NEEDLE HYPO 18GX1.5 SHARP (NEEDLE)
NEEDLE HYPO 22GX1.5 SAFETY (NEEDLE) IMPLANT
NS IRRIG 500ML POUR BTL (IV SOLUTION) IMPLANT
PACK CYSTO (CUSTOM PROCEDURE TRAY) ×2 IMPLANT
SYR 20CC LL (SYRINGE) IMPLANT
SYR 30ML LL (SYRINGE) IMPLANT
TUBE CONNECTING 12X1/4 (SUCTIONS) ×2 IMPLANT
WATER STERILE IRR 3000ML UROMA (IV SOLUTION) ×2 IMPLANT

## 2015-11-14 NOTE — Anesthesia Procedure Notes (Signed)
Procedure Name: MAC Performed by: Cadey Bazile T Oxygen Delivery Method: Nasal cannula Placement Confirmation: positive ETCO2     

## 2015-11-14 NOTE — H&P (Signed)
Dawn Price is a 30 year old female with a history of urethral stricture/stenosis.  History of Present Illness     OAB: The patient has undergone extensive evaluation of persistent suprapubic pain. She has been seen in the ER and been treated with antibiotics for possible PID. Pelvic ultrasound as well as a CT scan done in 4/16 revealed no abnormality. The pain is in the suprapubic region but not associated with any dysuria. In the daytime she reported she does have some frequency. She has suprapubic discomfort that does not occur every day but when it does occur she said it radiates into both of her legs. In addition she said over the past year she has had 4 UTIs that are associated with dysuria and cramping in area of the bladder. She does have some difficulty with constipation and gets some relief after having a bowel movement. She also reports that when her bladder fills she does not have pain but does experience increased pressure.  OAB symptoms score 24/40 with her primary symptoms being those of frequency and urgency.   A trial of Myrbetriq 25 mg and 50 mg resulted in no decrease in her voiding symptoms.    Interval history: I found at the time of surgery initially that she had fairly marked urethral stenosis that required dilation from 12 [place] up to 26 [place] without difficulty. Her bladder capacity under anesthesia was found to be normal at 700 cc with no significant petechial hemorrhages or glomerulations noted after hydrodistention. Bladder biopsies were found to be negative. She was seen here in the office on 07/02/15 with nonspecific complaints of decreased appetite, shortness of breath that seemed to be worsened when lying flat in bed and improved by elevation of her head.   She reports that after her surgery she noted marked incomplete resolution of all of her urinary symptoms. She continued to have persistent lower abdominal swelling/bloating and underwent repeat renal ultrasound.  Over time her symptoms have recurred and she said they actually have now become worse than they were before. She's had immediately after the surgery though her urination was back to what she would consider normal. She has a feeling of incomplete emptying. She also has suprapubic bloating and discomfort in the suprapubic region.   Past Medical History Problems  1. History of depression (Z86.59) 2. History of hypothyroidism (Z86.39)  Surgical History Problems  1. History of Bladder Irrigation 2. History of Cystoscopy For Urethral Stricture 3. History of Cystoscopy With Biopsy 4. History of Cystoscopy With Dilation Of Bladder 5. History of No Surgical Problems  Current Meds 1. Cephalexin 500 MG Oral Capsule; TAKE 1 CAPSULE TWICE DAILY;  Therapy: 08Sep2016 to (Evaluate:15Sep2016)  Requested for: 08Sep2016; Last  Rx:08Sep2016 Ordered 2. Hydrocodone-Acetaminophen TABS;  Therapy: (Recorded:07Sep2016) to Recorded 3. Ibuprofen TABS;  Therapy: (Recorded:07Sep2016) to Recorded 4. Pyridium TABS;  Therapy: (Recorded:07Sep2016) to Recorded  Allergies Medication  1. Doxycycline Hyclate CAPS Non-Medication  2. Almonds 3. Peanuts  Family History Problems  1. No pertinent family history : Mother  Social History Problems  1. Alcohol use (Z78.9) 2. Caffeine use (F15.90) 3. Never a smoker    Vitals Vital Signs  Blood Pressure: 99 / 61 Temperature: 98 F Heart Rate: 83  Review of Systems Genitourinary, constitutional, skin, eye, otolaryngeal, hematologic/lymphatic, cardiovascular, pulmonary, endocrine, musculoskeletal, gastrointestinal, neurological and psychiatric system(s) were reviewed and pertinent findings if present are noted and are otherwise negative.  Genitourinary: urinary frequency, urinary urgency, dysuria, nocturia, incontinence, urinary hesitancy and suprapubic pain.  Gastrointestinal: nausea  and constipation.  Constitutional: fever, feeling tired (fatigue) and recent  weight loss.  Musculoskeletal: back pain.  Neurological: dizziness.  Psychiatric: depression and anxiety.    Physical Exam Constitutional: Well nourished and well developed . No acute distress.  ENT:. The ears and nose are normal in appearance.  Neck: The appearance of the neck is normal and no neck mass is present.  Pulmonary: No respiratory distress and normal respiratory rhythm and effort.  Cardiovascular: Heart rate and rhythm are normal . No peripheral edema.  Abdomen: The abdomen is soft and nontender. No masses are palpated. No CVA tenderness. No hernias are palpable. No hepatosplenomegaly noted.  Genitourinary:  Chaperone Present: .  Examination of the external genitalia shows normal female external genitalia and no lesions. The urethra is normal in appearance and not tender. There is no urethral mass. Vaginal exam demonstrates no abnormalities. The adnexa are palpably normal. The bladder is non tender and not distended. The anus is normal on inspection. The perineum is normal on inspection.  Lymphatics: The femoral and inguinal nodes are not enlarged or tender.  Skin: Normal skin turgor, no visible rash and no visible skin lesions.  Neuro/Psych:. Mood and affect are appropriate.    Results/Data Urine  COLOR YELLOW  APPEARANCE CLEAR  SPECIFIC GRAVITY >1.030  pH 5.0  GLUCOSE NEGATIVE  BILIRUBIN NEGATIVE  KETONE NEGATIVE  BLOOD NEGATIVE  PROTEIN NEGATIVE  NITRITE NEGATIVE  LEUKOCYTE ESTERASE NEGATIVE   Old records or history reviewed: ER notes as above.  The following images/tracing/specimen were independently visualized:  Ultrasound PVR as below.  The following clinical lab reports were reviewed:  UA: Clear.   PVR: Ultrasound PVR 134 ml. She is not emptying her bladder completely.    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 In addition I obtained bladder biopsies which revealed no evidence of malignancy or other significant abnormality. Because she responded so dramatically to the urethral dilatation of what appeared to be a very marked meatal stenosis I suggested, especially because she is not emptying her bladder and quite symptomatic, that we proceed with dilation/calibration of her urethra in the office although she did not want to proceed in that fashion. I therefore have discussed reevaluation under anesthesia with dilation of urethra. I suspect she has redeveloped urethral stenosis. We discussed the fact that there is no surgical treatment for this other than dilatation and I can't recommend anesthetic dilatation every 6 months and a 30 year old. I therefore have informed her that I will perform urethral dilatation and then we'll have her come back to the office to learn how to perform self-catheterization. This will dilate and maintain her urethral patency and likely prevent her from needing any form of further surgical intervention.   Plan  Urethral dilatation under anesthesia.

## 2015-11-14 NOTE — Discharge Instructions (Signed)
Cystoscopy & urethral dilatation patient instructions  Following a cystoscopy, a catheter (a flexible rubber tube) is sometimes left in place to empty the bladder. This may cause some discomfort or a feeling that you need to urinate. Your doctor determines the period of time that the catheter will be left in place. You may have bloody urine for two to three days (Call your doctor if the amount of bleeding increases or does not subside).  You may pass blood clots in your urine, especially if you had a biopsy. It is not unusual to pass small blood clots and have some bloody urine a couple of weeks after your cystoscopy. Again, call your doctor if the bleeding does not subside. You may have: Dysuria (painful urination) Frequency (urinating often) Urgency (strong desire to urinate)  These symptoms are common especially if medicine is instilled into the bladder or a ureteral stent is placed. Avoiding alcohol and caffeine, such as coffee, tea, and chocolate, may help relieve these symptoms. Drink plenty of water, unless otherwise instructed. Your doctor may also prescribe an antibiotic or other medicine to reduce these symptoms.  Cystoscopy results are available soon after the procedure; biopsy results usually take two to four days. Your doctor will discuss the results of your exam with you. Before you go home, you will be given specific instructions for follow-up care. Special Instructions:  1 If you are going home with a catheter in place do not take a tub bath until removed by your doctor.  2 You may resume your normal activities.  3 Do not drive or operate machinery if you are taking narcotic pain medicine.  4 Be sure to keep all follow-up appointments with your doctor.   5 Call Your Doctor If:          You have severe pain  You are unable to urinate  You have a fever over 101  You have severe bleeding            Post Anesthesia Home Care Instructions  Activity: Get plenty of rest  for the remainder of the day. A responsible adult should stay with you for 24 hours following the procedure.  For the next 24 hours, DO NOT: -Drive a car -Advertising copywriter -Drink alcoholic beverages -Take any medication unless instructed by your physician -Make any legal decisions or sign important papers.  Meals: Start with liquid foods such as gelatin or soup. Progress to regular foods as tolerated. Avoid greasy, spicy, heavy foods. If nausea and/or vomiting occur, drink only clear liquids until the nausea and/or vomiting subsides. Call your physician if vomiting continues.  Special Instructions/Symptoms: Your throat may feel dry or sore from the anesthesia or the breathing tube placed in your throat during surgery. If this causes discomfort, gargle with warm salt water. The discomfort should disappear within 24 hours.  If you had a scopolamine patch placed behind your ear for the management of post- operative nausea and/or vomiting:  1. The medication in the patch is effective for 72 hours, after which it should be removed.  Wrap patch in a tissue and discard in the trash. Wash hands thoroughly with soap and water. 2. You may remove the patch earlier than 72 hours if you experience unpleasant side effects which may include dry mouth, dizziness or visual disturbances. 3. Avoid touching the patch. Wash your hands with soap and water after contact with the patch.

## 2015-11-14 NOTE — Op Note (Signed)
PATIENT:  Dawn Price  PRE-OPERATIVE DIAGNOSIS: 1. Bladder pressure 2. Incomplete bladder emptying 3. History of urethral/meatal stenosis.  POST-OPERATIVE DIAGNOSIS: Same  PROCEDURE: 1. Urethral calibration 2. Balloon dilatation of urethra. 3. Cystoscopy  SURGEON:  Claybon Jabs  INDICATION: Dawn Price is a 30 year old female who has been having difficulty with suprapubic discomfort and had symptoms that suggested possible interstitial cystitis. I therefore brought her to the operating room previously and had planned to perform cystoscopy with hydrodistention but found marked narrowing of the urethra which required dilation. She was found to have a normal capacity under anesthesia with no signs to suggest interstitial cystitis and bladder biopsies that were negative. She did well for several months after the procedure but then redeveloped suprapubic pressure and also was found to have incomplete emptying of her bladder. I suggested calibration/dilation in my office but she declined due to discomfort and therefore she is brought to the operating room for this procedure.  ANESTHESIA:  LMA/intravenous sedation  EBL: None  DRAINS: None  LOCAL MEDICATIONS USED:  None  SPECIMEN: None   Description of procedure: After informed consent the patient was taken to the operating room and placed on the table in a supine position. General anesthesia was then administered. Once fully anesthetized the patient was moved to the dorsal lithotomy position and the genitalia were sterilely prepped and draped in standard fashion. An official timeout was then performed.  I passed a 72 [place] female sound without difficulty and proceeded to calibrate further but found she was slightly tight to 18 [place].  A 0.038 inch sensor guidewire was passed into the bladder through the urethra and the UroMax nephrostomy dilating balloon was passed over the guidewire and through the urethra. I inflated the  UroMax which dilated the urethra to 24 [place]. I deflated the balloon and remove the guidewire.  Cystoscopy was performed using a 17 French cystoscope and 30 lens. As I passed the scope through the urethra I did not note any areas of bleeding nor were there any areas of obvious scar tissue or areas of obvious scar or stricture. The bladder was entered and was noted to have 1+ trabeculation but was free of any tumors, stones or inflammatory lesions.  I removed the cystoscope and passed a 59 [place] female sound easily through the urethra. The 67 [place] female sound met with some resistance and therefore I did not pass this through the urethra. I drained her bladder and she was awakened and taken recovery room in stable and satisfactory condition.  PLAN OF CARE: Discharge to home after PACU  PATIENT DISPOSITION:  PACU - hemodynamically stable.

## 2015-11-14 NOTE — Anesthesia Postprocedure Evaluation (Signed)
Anesthesia Post Note  Patient: Dawn Price  Procedure(s) Performed: Procedure(s) (LRB): CYSTOSCOPY WITH Balloon URETHRAL DILATATION (N/A)  Patient location during evaluation: PACU Anesthesia Type: MAC Level of consciousness: sedated and awake Pain management: pain level controlled Vital Signs Assessment: post-procedure vital signs reviewed and stable Respiratory status: spontaneous breathing and respiratory function stable Cardiovascular status: stable Anesthetic complications: no    Last Vitals:  Filed Vitals:   11/14/15 0950 11/14/15 1000  BP:  110/68  Pulse: 68 72  Temp:    Resp: 14 13    Last Pain:  Filed Vitals:   11/14/15 1004  PainSc: 5                  Kolin Erdahl DANIEL

## 2015-11-14 NOTE — Addendum Note (Signed)
Addendum  created 11/14/15 1109 by Heather Roberts, MD   Modules edited: Orders, PRL Based Order Sets

## 2015-11-14 NOTE — Transfer of Care (Signed)
Immediate Anesthesia Transfer of Care Note  Patient: Dawn Price  Procedure(s) Performed: Procedure(s): CYSTOSCOPY WITH Balloon URETHRAL DILATATION (N/A)  Patient Location: PACU  Anesthesia Type:MAC  Level of Consciousness: sedated and responds to stimulation  Airway & Oxygen Therapy: Patient Spontanous Breathing and Patient connected to nasal cannula oxygen  Post-op Assessment: Report given to RN  Post vital signs: Reviewed and stable  Last Vitals: 105/68, 75 100% Filed Vitals:   11/14/15 0828 11/14/15 0940  BP: 101/62   Pulse: 84   Temp: 36.7 C 36.6 C  Resp: 14     Complications: No apparent anesthesia complications

## 2015-11-14 NOTE — Anesthesia Preprocedure Evaluation (Addendum)
Anesthesia Evaluation  Patient identified by MRN, date of birth, ID band Patient awake    Reviewed: Allergy & Precautions, H&P , Patient's Chart, lab work & pertinent test results  Airway Mallampati: II  TM Distance: >3 FB Neck ROM: Full    Dental  (+) Teeth Intact, Dental Advisory Given   Pulmonary neg pulmonary ROS,    Pulmonary exam normal        Cardiovascular negative cardio ROS Normal cardiovascular exam     Neuro/Psych PSYCHIATRIC DISORDERS negative neurological ROS     GI/Hepatic Neg liver ROS, GERD  Medicated,  Endo/Other  negative endocrine ROS  Renal/GU negative Renal ROS     Musculoskeletal   Abdominal   Peds  Hematology   Anesthesia Other Findings   Reproductive/Obstetrics negative OB ROS                            Anesthesia Physical Anesthesia Plan  ASA: II  Anesthesia Plan: MAC   Post-op Pain Management:    Induction:   Airway Management Planned: Simple Face Mask  Additional Equipment:   Intra-op Plan:   Post-operative Plan:   Informed Consent: I have reviewed the patients History and Physical, chart, labs and discussed the procedure including the risks, benefits and alternatives for the proposed anesthesia with the patient or authorized representative who has indicated his/her understanding and acceptance.   Dental advisory given  Plan Discussed with:   Anesthesia Plan Comments:        Anesthesia Quick Evaluation

## 2015-11-18 ENCOUNTER — Ambulatory Visit (INDEPENDENT_AMBULATORY_CARE_PROVIDER_SITE_OTHER): Payer: No Typology Code available for payment source | Admitting: Family Medicine

## 2015-11-18 ENCOUNTER — Encounter (HOSPITAL_BASED_OUTPATIENT_CLINIC_OR_DEPARTMENT_OTHER): Payer: Self-pay | Admitting: Urology

## 2015-11-18 VITALS — BP 102/64 | HR 82 | Temp 97.7°F | Resp 16 | Ht 60.5 in | Wt 87.4 lb

## 2015-11-18 DIAGNOSIS — N390 Urinary tract infection, site not specified: Secondary | ICD-10-CM

## 2015-11-18 DIAGNOSIS — R1030 Lower abdominal pain, unspecified: Secondary | ICD-10-CM

## 2015-11-18 DIAGNOSIS — R112 Nausea with vomiting, unspecified: Secondary | ICD-10-CM | POA: Diagnosis not present

## 2015-11-18 DIAGNOSIS — R8281 Pyuria: Secondary | ICD-10-CM

## 2015-11-18 LAB — POCT CBC
Granulocyte percent: 57.4 %G (ref 37–80)
HCT, POC: 38.2 % (ref 37.7–47.9)
Hemoglobin: 13 g/dL (ref 12.2–16.2)
Lymph, poc: 2.5 (ref 0.6–3.4)
MCH, POC: 30.3 pg (ref 27–31.2)
MCHC: 34.1 g/dL (ref 31.8–35.4)
MCV: 89 fL (ref 80–97)
MID (cbc): 0.6 (ref 0–0.9)
MPV: 6 fL (ref 0–99.8)
POC Granulocyte: 4.1 (ref 2–6.9)
POC LYMPH PERCENT: 34.5 %L (ref 10–50)
POC MID %: 8.1 %M (ref 0–12)
Platelet Count, POC: 370 10*3/uL (ref 142–424)
RBC: 4.29 M/uL (ref 4.04–5.48)
RDW, POC: 13 %
WBC: 7.2 10*3/uL (ref 4.6–10.2)

## 2015-11-18 LAB — POC MICROSCOPIC URINALYSIS (UMFC): Mucus: ABSENT

## 2015-11-18 LAB — POCT URINALYSIS DIP (MANUAL ENTRY)
Bilirubin, UA: NEGATIVE
Blood, UA: NEGATIVE
Glucose, UA: 100 — AB
Ketones, POC UA: NEGATIVE
Leukocytes, UA: NEGATIVE
Nitrite, UA: POSITIVE — AB
Spec Grav, UA: 1.015
Urobilinogen, UA: 1
pH, UA: 7.5

## 2015-11-18 MED ORDER — CIPROFLOXACIN HCL 500 MG PO TABS: 500.0000 mg | ORAL_TABLET | Freq: Two times a day (BID) | ORAL | Status: DC

## 2015-11-18 MED ORDER — NITROFURANTOIN MONOHYD MACRO 100 MG PO CAPS: 100.0000 mg | ORAL_CAPSULE | Freq: Two times a day (BID) | ORAL | Status: DC

## 2015-11-18 NOTE — Addendum Note (Signed)
Addended by: Elvina Sidle on: 11/18/2015 06:47 PM   Modules accepted: Orders

## 2015-11-18 NOTE — Patient Instructions (Signed)
Uses little as possible of the hydrocodone because it will cause nausea.  You do appear to have a urinary infection and so called an antibiotic for you. Please let us know if the symptoms persist

## 2015-11-18 NOTE — Progress Notes (Signed)
By signing my name below, I, Stann Ore, attest that this documentation has been prepared under the direction and in the presence of Elvina Sidle, MD. Electronically Signed: Stann Ore, Scribe. 11/18/2015 , 6:06 PM .  Patient was seen in room 13 .   Patient ID: Dawn Price MRN: [id number], DOB: [birth year], 30 y.o. Date of Encounter: 11/18/2015  Primary Physician: Janace Hoard, MD  Chief Complaint:  Chief Complaint  Patient presents with  . Nausea    since surgery  . Emesis  . Other    Had surgery friday cystoscopy, urethrodilation?    HPI:  Dawn Price is a 30 y.o. female who presents to [facility] complaining of nausea with emesis and lower abdominal pain since her surgery (done by Dr. Vernie Ammons) 4 days ago. She had a cystoscopy with balloon urethral dilatation. She felt really fatigue this morning. She was given hydrocodone for the pain, her last dose was half a pill an hour before coming into the office today. Her last episode of vomiting was this morning. She's able to keep fluids down. She's also been constipated due to the medication.   Past Medical History  Diagnosis Date  . Anxiety   . Bladder pain   . History of pelvic inflammatory disease   . History of gastritis     mild per EGD 03-26-2015  . Major depressive disorder (HCC)   . History of panic attacks   . Urgency of urination   . GERD (gastroesophageal reflux disease)   . Vitamin D deficiency   . UTI (lower urinary tract infection)     dx 11-08-2015  TREATED W/ KEFLEX  . Yeast infection     dx 11-08-2015  TREATED W/ DIFLUCAN  . Dysuria   . Meatal stenosis      Home Meds: Prior to Admission medications   Medication Sig Start Date End Date Taking? Authorizing Provider  cephALEXin (KEFLEX) 500 MG capsule Take 1 capsule (500 mg total) by mouth 4 (four) times daily. 11/08/15   Elpidio Anis, PA-C  HYDROcodone-acetaminophen (NORCO) 10-325 MG tablet Take 1 tablet by mouth  every 12 (twelve) hours as needed (pain). Reported on 11/09/2015    Historical Provider, MD  HYDROcodone-acetaminophen (NORCO) 10-325 MG tablet Take 1-2 tablets by mouth every 4 (four) hours as needed for moderate pain. Maximum dose per 24 hours - 8 pills 11/14/15   Ihor Gully, MD  ibuprofen (ADVIL,MOTRIN) 200 MG tablet Take 200 mg by mouth every 6 (six) hours as needed (pain). Reported on 11/09/2015    Historical Provider, MD  phenazopyridine (PYRIDIUM) 200 MG tablet Take 1 tablet (200 mg total) by mouth 3 (three) times daily. 11/09/15   Elpidio Anis, PA-C  phenazopyridine (PYRIDIUM) 200 MG tablet Take 1 tablet (200 mg total) by mouth 3 (three) times daily as needed for pain. 11/14/15   Ihor Gully, MD    Allergies:  Allergies  Allergen Reactions  . Almond Oil Rash  . Doxycycline Nausea Only  . Peanuts [Peanut Oil] Rash    Social History   Social History  . Marital Status: Single    Spouse Name: N/A  . Number of Children: 0  . Years of Education: N/A   Occupational History  . Doctor-PHD [employer]    Social History Main Topics  . Smoking status: Never Smoker   . Smokeless tobacco: Never Used  . Alcohol Use: No  . Drug Use: No  . Sexual Activity: Not on file   Other Topics Concern  .  Not on file   Social History Narrative   Marital status: engaged      Children: none      Lives: alone      Employment: completed PhD; working at [employer]; postdoctorate degree      Tobacco: none      Alcohol: socially      Exercise:  Not currently; before twice per week.       Review of Systems: Constitutional: negative for fever, night sweats, weight changes; positive for chills, fatigue HEENT: negative for vision changes, hearing loss, congestion, rhinorrhea, ST, epistaxis, or sinus pressure Cardiovascular: negative for chest pain or palpitations Respiratory: negative for hemoptysis, wheezing, shortness of breath, or cough Abdominal: negative for diarrhea; positive for constipation,  nausea, vomiting, abd pain (lower) Dermatological: negative for rash Neurologic: negative for headache, dizziness, or syncope All other systems reviewed and are otherwise negative with the exception to those above and in the HPI.  Physical Exam: Blood pressure 102/64, pulse 82, temperature 97.7 F (36.5 C), temperature source Oral, resp. rate 16, height 5' 0.5" (1.537 m), weight 87 lb 6.4 oz (39.644 kg), last menstrual period 11/18/2015, SpO2 93 %., Body mass index is 16.78 kg/(m^2). General: Well developed, well nourished, in no acute distress. Head: Normocephalic, atraumatic, eyes without discharge, sclera non-icteric, nares are without discharge. Bilateral auditory canals clear, TM's are without perforation, pearly grey and translucent with reflective cone of light bilaterally. Oral cavity moist, posterior pharynx without exudate, erythema, peritonsillar abscess, or post nasal drip.  Neck: Supple. No thyromegaly. Full ROM. No lymphadenopathy. Lungs: Clear bilaterally to auscultation without wheezes, rales, or rhonchi. Breathing is unlabored. Heart: RRR with S1 S2. No murmurs, rubs, or gallops appreciated. Abdomen: Soft, non-tender, non-distended with normoactive bowel sounds. No hepatomegaly. No rebound/guarding. No obvious abdominal masses. Msk:  Strength and tone normal for age. Extremities/Skin: Warm and dry. No clubbing or cyanosis. No edema. No rashes or suspicious lesions. Neuro: Alert and oriented X 3. Moves all extremities spontaneously. Gait is normal. CNII-XII grossly in tact. Psych:  Responds to questions appropriately with a normal affect.   Labs: Results for orders placed or performed in visit on 11/18/15  POCT CBC  Result Value Ref Range   WBC 7.2 4.6 - 10.2 K/uL   Lymph, poc 2.5 0.6 - 3.4   POC LYMPH PERCENT 34.5 10 - 50 %L   MID (cbc) 0.6 0 - 0.9   POC MID % 8.1 0 - 12 %M   POC Granulocyte 4.1 2 - 6.9   Granulocyte percent 57.4 37 - 80 %G   RBC 4.29 4.04 - 5.48 M/uL     Hemoglobin 13.0 12.2 - 16.2 g/dL   HCT, POC 16.1 09.6 - 47.9 %   MCV 89.0 80 - 97 fL   MCH, POC 30.3 27 - 31.2 pg   MCHC 34.1 31.8 - 35.4 g/dL   RDW, POC 04.5 %   Platelet Count, POC 370 142 - 424 K/uL   MPV 6.0 0 - 99.8 fL  POCT urinalysis dipstick  Result Value Ref Range   Color, UA orange (A) yellow   Clarity, UA hazy (A) clear   Glucose, UA =100 (A) negative   Bilirubin, UA negative negative   Ketones, POC UA negative negative   Spec Grav, UA 1.015    Blood, UA negative negative   pH, UA 7.5    Protein Ur, POC trace (A) negative   Urobilinogen, UA 1.0    Nitrite, UA Positive (A) Negative  Leukocytes, UA Negative Negative  POCT Microscopic Urinalysis ([facility])  Result Value Ref Range   WBC,UR,HPF,POC Few (A) None WBC/hpf   RBC,UR,HPF,POC None None RBC/hpf   Bacteria None None, Too numerous to count   Mucus Absent Absent   Epithelial Cells, UR Per Microscopy None None, Too numerous to count cells/hpf     ASSESSMENT AND PLAN:  30 y.o. year old female with presumed UTI  This chart was scribed in my presence and reviewed by me personally.    ICD-9-CM ICD-10-CM   1. Nausea and vomiting, intractability of vomiting not specified, unspecified vomiting type 787.01 R11.2 POCT CBC     POCT urinalysis dipstick     POCT Microscopic Urinalysis ([facility])     Urine culture     ciprofloxacin (CIPRO) 500 MG tablet  2. Suprapubic abdominal pain, unspecified laterality 789.09 R10.30 POCT CBC     POCT urinalysis dipstick     POCT Microscopic Urinalysis ([facility])     Urine culture     ciprofloxacin (CIPRO) 500 MG tablet     Signed, Elvina Sidle, MD 11/18/2015 6:06 PM

## 2015-11-19 ENCOUNTER — Emergency Department (HOSPITAL_COMMUNITY)
Admission: EM | Admit: 2015-11-19 | Discharge: 2015-11-19 | Disposition: A | Discharge status: Home / Self Care | Payer: No Typology Code available for payment source | Attending: Emergency Medicine | Admitting: Emergency Medicine

## 2015-11-19 ENCOUNTER — Encounter (HOSPITAL_COMMUNITY): Payer: Self-pay | Admitting: Emergency Medicine

## 2015-11-19 DIAGNOSIS — R61 Generalized hyperhidrosis: Secondary | ICD-10-CM | POA: Insufficient documentation

## 2015-11-19 DIAGNOSIS — Z8742 Personal history of other diseases of the female genital tract: Secondary | ICD-10-CM | POA: Insufficient documentation

## 2015-11-19 DIAGNOSIS — N390 Urinary tract infection, site not specified: Secondary | ICD-10-CM | POA: Diagnosis not present

## 2015-11-19 DIAGNOSIS — R55 Syncope and collapse: Secondary | ICD-10-CM | POA: Insufficient documentation

## 2015-11-19 DIAGNOSIS — R319 Hematuria, unspecified: Secondary | ICD-10-CM

## 2015-11-19 DIAGNOSIS — Z8719 Personal history of other diseases of the digestive system: Secondary | ICD-10-CM | POA: Diagnosis not present

## 2015-11-19 DIAGNOSIS — Z3202 Encounter for pregnancy test, result negative: Secondary | ICD-10-CM | POA: Insufficient documentation

## 2015-11-19 DIAGNOSIS — Z8619 Personal history of other infectious and parasitic diseases: Secondary | ICD-10-CM | POA: Diagnosis not present

## 2015-11-19 DIAGNOSIS — R1030 Lower abdominal pain, unspecified: Secondary | ICD-10-CM | POA: Diagnosis present

## 2015-11-19 DIAGNOSIS — Z792 Long term (current) use of antibiotics: Secondary | ICD-10-CM | POA: Diagnosis not present

## 2015-11-19 DIAGNOSIS — Z8659 Personal history of other mental and behavioral disorders: Secondary | ICD-10-CM | POA: Insufficient documentation

## 2015-11-19 DIAGNOSIS — Z8639 Personal history of other endocrine, nutritional and metabolic disease: Secondary | ICD-10-CM | POA: Diagnosis not present

## 2015-11-19 LAB — COMPREHENSIVE METABOLIC PANEL
ALT: 116 U/L — AB (ref 14–54)
ANION GAP: 7 (ref 5–15)
AST: 67 U/L — ABNORMAL HIGH (ref 15–41)
Albumin: 3.6 g/dL (ref 3.5–5.0)
Alkaline Phosphatase: 65 U/L (ref 38–126)
BUN: 15 mg/dL (ref 6–20)
CALCIUM: 8.2 mg/dL — AB (ref 8.9–10.3)
CHLORIDE: 108 mmol/L (ref 101–111)
CO2: 24 mmol/L (ref 22–32)
CREATININE: 0.56 mg/dL (ref 0.44–1.00)
Glucose, Bld: 94 mg/dL (ref 65–99)
Potassium: 4.2 mmol/L (ref 3.5–5.1)
SODIUM: 139 mmol/L (ref 135–145)
Total Bilirubin: 0.8 mg/dL (ref 0.3–1.2)
Total Protein: 6.1 g/dL — ABNORMAL LOW (ref 6.5–8.1)

## 2015-11-19 LAB — CBC WITH DIFFERENTIAL/PLATELET
BASOS PCT: 0 %
Basophils Absolute: 0 10*3/uL (ref 0.0–0.1)
EOS PCT: 2 %
Eosinophils Absolute: 0.1 10*3/uL (ref 0.0–0.7)
HEMATOCRIT: 36.9 % (ref 36.0–46.0)
Hemoglobin: 12 g/dL (ref 12.0–15.0)
Lymphocytes Relative: 22 %
Lymphs Abs: 1.7 10*3/uL (ref 0.7–4.0)
MCH: 30.3 pg (ref 26.0–34.0)
MCHC: 32.5 g/dL (ref 30.0–36.0)
MCV: 93.2 fL (ref 78.0–100.0)
MONO ABS: 0.4 10*3/uL (ref 0.1–1.0)
MONOS PCT: 6 %
NEUTROS ABS: 5.2 10*3/uL (ref 1.7–7.7)
Neutrophils Relative %: 70 %
Platelets: 261 10*3/uL (ref 150–400)
RBC: 3.96 MIL/uL (ref 3.87–5.11)
RDW: 12.2 % (ref 11.5–15.5)
WBC: 7.5 10*3/uL (ref 4.0–10.5)

## 2015-11-19 LAB — URINALYSIS, ROUTINE W REFLEX MICROSCOPIC
BILIRUBIN URINE: NEGATIVE
GLUCOSE, UA: NEGATIVE mg/dL
Hgb urine dipstick: NEGATIVE
KETONES UR: NEGATIVE mg/dL
NITRITE: POSITIVE — AB
PH: 6.5 (ref 5.0–8.0)
PROTEIN: NEGATIVE mg/dL
Specific Gravity, Urine: 1.015 (ref 1.005–1.030)

## 2015-11-19 LAB — WET PREP, GENITAL
CLUE CELLS WET PREP: NONE SEEN
SPERM: NONE SEEN
TRICH WET PREP: NONE SEEN
Yeast Wet Prep HPF POC: NONE SEEN

## 2015-11-19 LAB — I-STAT CG4 LACTIC ACID, ED
LACTIC ACID, VENOUS: 1.19 mmol/L (ref 0.5–2.0)
LACTIC ACID, VENOUS: 1.12 mmol/L (ref 0.5–2.0)

## 2015-11-19 LAB — I-STAT BETA HCG BLOOD, ED (MC, WL, AP ONLY)

## 2015-11-19 LAB — URINE MICROSCOPIC-ADD ON: RBC / HPF: NONE SEEN RBC/hpf (ref 0–5)

## 2015-11-19 LAB — LIPASE, BLOOD: LIPASE: 27 U/L (ref 11–51)

## 2015-11-19 MED ORDER — SODIUM CHLORIDE 0.9 % IV BOLUS (SEPSIS)
1000.0000 mL | Freq: Once | INTRAVENOUS | Status: AC
  Administered 2015-11-19: 1000 mL via INTRAVENOUS

## 2015-11-19 MED ORDER — SODIUM CHLORIDE 0.9 % IV BOLUS (SEPSIS)
500.0000 mL | Freq: Once | INTRAVENOUS | Status: AC
  Administered 2015-11-19: 500 mL via INTRAVENOUS

## 2015-11-19 MED ORDER — PROMETHAZINE HCL 25 MG/ML IJ SOLN
25.0000 mg | Freq: Once | INTRAMUSCULAR | Status: AC
  Administered 2015-11-19: 25 mg via INTRAVENOUS
  Filled 2015-11-19: qty 1

## 2015-11-19 MED ORDER — ONDANSETRON 4 MG PO TBDP: 4.0000 mg | ORAL_TABLET | Freq: Three times a day (TID) | ORAL | Status: DC | PRN

## 2015-11-19 MED ORDER — ONDANSETRON HCL 4 MG/2ML IJ SOLN
4.0000 mg | Freq: Once | INTRAMUSCULAR | Status: AC
  Administered 2015-11-19: 4 mg via INTRAVENOUS
  Filled 2015-11-19: qty 2

## 2015-11-19 MED ORDER — DEXTROSE 5 % IV SOLN
1.0000 g | Freq: Once | INTRAVENOUS | Status: AC
  Administered 2015-11-19: 1 g via INTRAVENOUS
  Filled 2015-11-19: qty 10

## 2015-11-19 NOTE — Discharge Instructions (Signed)
You were seen in the emergency room today for evaluation following a near-syncopal episode. Your tests show continued evidence of urinary tract infection but were otherwise unremarkable. We were able to control your nausea and vomiting. Please continue taking your antibiotic (Macrobid) as prescribed. I will give you a prescription for nausea medicine to take as needed. Please call your primary care provider and urologist to schedule a follow up appointment for later this week. Return to the ER for new or worsening symptoms.

## 2015-11-19 NOTE — ED Provider Notes (Signed)
Sign out received from PA-C T. Neva Seat. Dawn Price is an 30 y.o. female presenting to the ED following syncopal episode. Pt notably had urethral dilation done five days ago due to urinary retention. She reportedly has not had normal urination since then and has had progressive suprapubic pain. Saw PCP yesterday and found to have nitrite positive urine, started on macrobid for presumed UTI. Last night had a syncopal episode in the rest room and in the ED has continued to be nauseated, lightheaded, and weak-feeling. Outgoing team had consulted with urology. We will re-check a UA. Will also do a post void residual bladder scan. If residual is <300 and pt can tolerate PO we will treat this as a simple UTI. If residual >300 or pt continues to have intractable vomiting will have to admit. If UA comes back clean and pt still symptomatic will consider abdominal US to r/o other acute intra-abdominal pathology, especially given pt's increased AST/ALT, though pt has been taking Norco intermittently since her procedure.  Physical Exam  BP 99/70 mmHg  Pulse 82  Temp(Src) 97.4 F (36.3 C) (Oral)  SpO2 100%  LMP 11/18/2015  Physical Exam  Constitutional: She is oriented to person, place, and time.  Thin, appears fatigued but NAD.   HENT:  Right Ear: External ear normal.  Left Ear: External ear normal.  Nose: Nose normal.  Mouth/Throat: Oropharynx is clear and moist. No oropharyngeal exudate.  Eyes: Conjunctivae and EOM are normal. Pupils are equal, round, and reactive to light.  Neck: Normal range of motion. Neck supple.  Cardiovascular: Normal rate, regular rhythm, normal heart sounds and intact distal pulses.   Pulmonary/Chest: Effort normal and breath sounds normal. No respiratory distress. She exhibits no tenderness.  Abdominal: Soft. Bowel sounds are normal. She exhibits no distension. There is tenderness in the suprapubic area. There is no rigidity, no rebound, no guarding and no CVA  tenderness.  Musculoskeletal: She exhibits no edema.  Neurological: She is alert and oriented to person, place, and time. No cranial nerve deficit.  Skin: Skin is warm and dry.  Psychiatric: She has a normal mood and affect.  Nursing note and vitals reviewed.    Filed Vitals:   11/19/15 0500 11/19/15 0600 11/19/15 0614 11/19/15 0700  BP: 99/65 106/63 102/64 94/57  Pulse: 79 77 77 73  Temp:      TempSrc:      Resp: SpO2: 100% 100% 99% 98%     ED Course  Procedures  Urinalysis    Component Value Date/Time   COLORURINE ORANGE* 11/19/2015 0530   APPEARANCEUR CLEAR 11/19/2015 0530   LABSPEC 1.015 11/19/2015 0530   PHURINE 6.5 11/19/2015 0530   GLUCOSEU NEGATIVE 11/19/2015 0530   HGBUR NEGATIVE 11/19/2015 0530   BILIRUBINUR NEGATIVE 11/19/2015 0530   BILIRUBINUR negative 11/18/2015 1832   BILIRUBINUR neg 03/27/2015 2012   KETONESUR NEGATIVE 11/19/2015 0530   KETONESUR negative 11/18/2015 1832   PROTEINUR NEGATIVE 11/19/2015 0530   PROTEINUR neg 03/27/2015 2012   UROBILINOGEN 1.0 11/18/2015 1832   UROBILINOGEN 0.2 07/03/2015 1729   NITRITE POSITIVE* 11/19/2015 0530   NITRITE Positive* 11/18/2015 1832   NITRITE neg 03/27/2015 2012   LEUKOCYTESUR TRACE* 11/19/2015 0530     MDM Post-void residual on bladder scan 83mL. UA shows nitrite positive, trace leuks, and few bacteria. Will send for culture as well. BP has been soft but this appears chronic for pt. Phenergan given in addition to zofran due to continued nausea. Will PO  challenge.   Pt reports mild nausea initially after PO intake but that resolved quickly. She has had no further episodes of emesis. However, pt states she felt "shaky" upon ambulation to the restroom. Orthostatics negative, she has an intact neuro exam for me. She has mild suprapubic tenderness though e/o UTI on UA. She does look a little dry on exam. Will give another 500 cc fluids and reassess.   Pt reports improvement in lightheadedness as  bolus going in. HOwever now has complaints of vaginal irritation/itching and feeling like she has discharge about to come out. Concerned about poss yeast infection. Will do pelvic exam with wet prep and gc/chlamydia.  Wet prep shows some WBC otherwise unrevealing. GC/chlamydia sent. No e/o yeast at this time. Pt is tolerating PO with improvement in her symptoms. Instructed to finish rx for macrobid. Rx given for zofran. ER return precautions given.   Carlene Coria, PA-C 11/19/15 1615  Geoffery Lyons, MD 11/24/15 [phone]

## 2015-11-19 NOTE — ED Provider Notes (Signed)
CSN: [id number]     Arrival date & time 11/19/15  0351 History   First MD Initiated Contact with Patient 11/19/15 0356     No chief complaint on file.    (Consider location/radiation/quality/duration/timing/severity/associated sxs/prior Treatment) HPI  Patient presents to the emergency department for evaluation after syncopal episode. She had a urethral dilatation done this past Friday due to urinary retention. She states she has not urinated normally after the procedure. A few days ago she reports developing worsened urinary retention, dysuria and full sensation with suprapubic pain. She then developed nausea and vomiting. She has been on her menstrual cycle since January 19. She saw her primary care doctor yesterday who did a urinalysis in which in she is nitrite positive with no bacteria. He diagnosed her with presumed UTI and started her on Macrobid. The patient states that the pain and the vomiting continued to get worse, she needed to go to the bathroom this evening's Alonna Buckler helped her and when she went to urinate she reports her vision going black for a few seconds passing out. She did not follow-up the toilet, hit her head, injure her neck or have any other injuries. She presents continuing to be nauseous, still weak, is shaking because she said she feels cold, and mildly diaphoretic.  Past Medical History  Diagnosis Date  . Anxiety   . Bladder pain   . History of pelvic inflammatory disease   . History of gastritis     mild per EGD 03-26-2015  . Major depressive disorder (HCC)   . History of panic attacks   . Urgency of urination   . GERD (gastroesophageal reflux disease)   . Vitamin D deficiency   . UTI (lower urinary tract infection)     dx 11-08-2015  TREATED W/ KEFLEX  . Yeast infection     dx 11-08-2015  TREATED W/ DIFLUCAN  . Dysuria   . Meatal stenosis    Past Surgical History  Procedure Laterality Date  . Esophagogastroduodenoscopy  03-26-2015  . Cystoscopy with  hydrodistension and biopsy N/A 06/27/2015    Procedure: CYSTOSCOPY/BLADDER BIOPSY/HYDRODISTENSION AND  INSTILLATION OF MARCAINE AND PYRIDIUM;  Surgeon: Ihor Gully, MD;  [facility];  Service: Urology;  Laterality: N/A;  . Cystoscopy with urethral dilatation N/A 11/14/2015    Procedure: CYSTOSCOPY WITH Balloon URETHRAL DILATATION;  Surgeon: Ihor Gully, MD;  Location: [facility];  Service: Urology;  Laterality: N/A;   Family History  Problem Relation Age of Onset  . Colon cancer Neg Hx   . Esophageal cancer Neg Hx   . Rectal cancer Neg Hx   . Stomach cancer Neg Hx   . Kidney disease Maternal Aunt   . Heart disease Maternal Aunt   . Gallbladder disease Maternal Aunt    Social History  Substance Use Topics  . Smoking status: Never Smoker   . Smokeless tobacco: Never Used  . Alcohol Use: No   OB History    No data available     Review of Systems  Review of Systems All other systems negative except as documented in the HPI. All pertinent positives and negatives as reviewed in the HPI.   Allergies  Almond oil; Doxycycline; and Peanuts  Home Medications   Prior to Admission medications   Medication Sig Start Date End Date Taking? Authorizing Provider  HYDROcodone-acetaminophen (NORCO) 10-325 MG tablet Take 1-2 tablets by mouth every 4 (four) hours as needed for moderate pain. Maximum dose per 24 hours - 8  pills 11/14/15  Yes Ihor Gully, MD  ibuprofen (ADVIL,MOTRIN) 200 MG tablet Take 200 mg by mouth every 6 (six) hours as needed (pain). Reported on 11/18/2015   Yes Historical Provider, MD  nitrofurantoin, macrocrystal-monohydrate, (MACROBID) 100 MG capsule Take 1 capsule (100 mg total) by mouth 2 (two) times daily. 11/18/15  Yes Elvina Sidle, MD  phenazopyridine (PYRIDIUM) 200 MG tablet Take 1 tablet (200 mg total) by mouth 3 (three) times daily as needed for pain. 11/14/15  Yes Ihor Gully, MD  phenazopyridine (PYRIDIUM) 200 MG tablet Take  1 tablet (200 mg total) by mouth 3 (three) times daily. Patient not taking: Reported on 11/19/2015 11/09/15   Elpidio Anis, PA-C   BP 99/70 mmHg  Pulse 82  Temp(Src) 97.4 F (36.3 C) (Oral)  SpO2 100%  LMP 11/18/2015 Physical Exam  Constitutional: She appears well-developed and well-nourished. She appears ill. No distress.  HENT:  Head: Normocephalic and atraumatic.  Right Ear: Tympanic membrane and ear canal normal.  Left Ear: Tympanic membrane and ear canal normal.  Nose: Nose normal.  Mouth/Throat: Uvula is midline, oropharynx is clear and moist and mucous membranes are normal.  Eyes: Pupils are equal, round, and reactive to light.  Neck: Normal range of motion. Neck supple.  Cardiovascular: Normal rate and regular rhythm.   Pulmonary/Chest: Effort normal.  Abdominal: Soft. Bowel sounds are normal. She exhibits no distension. There is tenderness in the suprapubic area. There is no rigidity, no rebound and no guarding.  No signs of abdominal distention  Musculoskeletal:  No LE swelling  Neurological: She is alert.  Acting at baseline  Skin: Skin is warm. No rash noted. She is diaphoretic (pt shivering).  Nursing note and vitals reviewed.   ED Course  Procedures (including critical care time) Labs Review Labs Reviewed  URINE CULTURE  CULTURE, BLOOD (ROUTINE X 2)  CULTURE, BLOOD (ROUTINE X 2)  CBC WITH DIFFERENTIAL/PLATELET  COMPREHENSIVE METABOLIC PANEL  LIPASE, BLOOD  I-STAT BETA HCG BLOOD, ED ([facility], [facility], [facility] ONLY)  I-STAT CG4 LACTIC ACID, ED    Imaging Review No results found. I have personally reviewed and evaluated these images and lab results as part of my medical decision-making.   EKG Interpretation   Date/Time:  Wednesday November 19 2015 04:05:26 EST Ventricular Rate:  68 PR Interval:  146 QRS Duration: 85 QT Interval:  429 QTC Calculation: 456 R Axis:   72 Text Interpretation:  Sinus rhythm Normal ECG Confirmed by DELO  MD,  DOUGLAS ([id number]) on  11/19/2015 4:36:38 AM      MDM   Final diagnoses:  None   Pt is concerned that her pain medication dose may be too strong. She was prescribed norco 10's. She was having pain, nausea and vomiting before she started taking the medications a few days ago.  As a precaution, due to possible complicated UTI, IV rocephin started early in patient care. She was also given fluids and blood cultures and urine cultures taken as well.   At end of shift patient hand off to S. Sem, PA-C. Will need to obtain patients urinalysis  I spoke with on-call Urology, pt can be treated as a simple UTI regarding abnormal urine if she is not retaining more than 300 mLs on bladder scan.   Marlon Pel, PA-C 11/19/15 [id number]  Geoffery Lyons, MD 11/24/15 [phone]

## 2015-11-19 NOTE — ED Notes (Addendum)
Patient reports nausea after few sips of water

## 2015-11-19 NOTE — ED Notes (Signed)
Delay in lab draw tech getting vitals

## 2015-11-19 NOTE — ED Notes (Signed)
Bed: ZO10 Expected date:  Expected time:  Means of arrival:  Comments: EMS 30 yo female syncopal episode

## 2015-11-19 NOTE — ED Notes (Signed)
Patient presents from home via EMS for syncopal episode. Denies hitting head, denies neck or back pain. Seen yesterday for blood work and diagnosed with uti. Negative orthostatics per EMS.  22 r hand, 100cc NS in route.   Last VS: 93/58, 67hr, 97% ra, 109cbg

## 2015-11-20 LAB — URINE CULTURE
CULTURE: NO GROWTH
Colony Count: NO GROWTH
Organism ID, Bacteria: NO GROWTH
SPECIAL REQUESTS: NORMAL

## 2015-11-20 LAB — GC/CHLAMYDIA PROBE AMP (~~LOC~~) NOT AT ARMC
CHLAMYDIA, DNA PROBE: NEGATIVE
NEISSERIA GONORRHEA: NEGATIVE

## 2015-11-22 ENCOUNTER — Ambulatory Visit (INDEPENDENT_AMBULATORY_CARE_PROVIDER_SITE_OTHER): Payer: No Typology Code available for payment source | Admitting: Family Medicine

## 2015-11-22 VITALS — BP 120/86 | HR 90 | Temp 98.2°F | Resp 16 | Ht 60.5 in | Wt 89.2 lb

## 2015-11-22 DIAGNOSIS — N358 Other urethral stricture: Secondary | ICD-10-CM | POA: Diagnosis not present

## 2015-11-22 DIAGNOSIS — R21 Rash and other nonspecific skin eruption: Secondary | ICD-10-CM | POA: Diagnosis not present

## 2015-11-22 DIAGNOSIS — T7840XA Allergy, unspecified, initial encounter: Secondary | ICD-10-CM | POA: Diagnosis not present

## 2015-11-22 DIAGNOSIS — R300 Dysuria: Secondary | ICD-10-CM | POA: Diagnosis not present

## 2015-11-22 DIAGNOSIS — IMO0002 Reserved for concepts with insufficient information to code with codable children: Secondary | ICD-10-CM

## 2015-11-22 LAB — POC MICROSCOPIC URINALYSIS (UMFC): Mucus: ABSENT

## 2015-11-22 LAB — POCT URINALYSIS DIP (MANUAL ENTRY)
BILIRUBIN UA: NEGATIVE
Bilirubin, UA: NEGATIVE
Glucose, UA: NEGATIVE
Leukocytes, UA: NEGATIVE
Nitrite, UA: NEGATIVE
PROTEIN UA: NEGATIVE
SPEC GRAV UA: 1.025
UROBILINOGEN UA: 0.2
pH, UA: 6.5

## 2015-11-22 MED ORDER — RANITIDINE HCL 150 MG PO TABS
150.0000 mg | ORAL_TABLET | Freq: Once | ORAL | Status: AC
  Administered 2015-11-22: 150 mg via ORAL

## 2015-11-22 MED ORDER — CETIRIZINE HCL 10 MG PO TABS: 10.0000 mg | ORAL_TABLET | Freq: Once | ORAL | Status: DC

## 2015-11-22 MED ORDER — CETIRIZINE HCL 5 MG PO TABS: 5.0000 mg | ORAL_TABLET | Freq: Every day | ORAL | Status: DC

## 2015-11-22 MED ORDER — CETIRIZINE HCL 10 MG PO TABS
10.0000 mg | ORAL_TABLET | Freq: Once | ORAL | Status: AC
  Administered 2015-11-22: 10 mg via ORAL

## 2015-11-22 MED ORDER — DIPHENHYDRAMINE HCL 25 MG PO CAPS
25.0000 mg | ORAL_CAPSULE | Freq: Once | ORAL | Status: AC
  Administered 2015-11-22: 25 mg via ORAL

## 2015-11-22 NOTE — Progress Notes (Signed)
Subjective:    Patient ID: Dawn Price, female    DOB: 1986-09-11, 30 y.o.   MRN: [id number]  11/22/2015  other and Rash   HPI This 30 y.o. female presents for evaluation of rash due to Macrobid. Onset of rash in neck, scalp, abdomen.  Headache.  +throat irritation or feeling if something is in throat.  No difficulties swallowing or breathing.  No SOB or wheezing.  No facial swelling. No palm or sole involvement.  No mucosal involvement.   Urethral stricture:  S/p cystoscopy in office on one week ago s/p dilatation.  Follow-up on Monday.  Still having UTI.  No abx around cystoscopy.  Urine culture always negative.   Urinary frequency; no hematuria. +frequency; nocturia x 3-4 last night; usually x 1.  No fever.  Did take zofran x 1 this week.  Hydrocodone stopped on Tuesday as well.  Took four doses of Macrobid.    Review of Systems  Constitutional: Negative for fever, chills, diaphoresis and fatigue.  Eyes: Negative for visual disturbance.  Respiratory: Negative for cough and shortness of breath.   Cardiovascular: Negative for chest pain, palpitations and leg swelling.  Gastrointestinal: Negative for nausea, vomiting, abdominal pain, diarrhea and constipation.  Endocrine: Negative for cold intolerance, heat intolerance, polydipsia, polyphagia and polyuria.  Genitourinary: Positive for dysuria. Negative for urgency, frequency, hematuria, flank pain, vaginal bleeding, vaginal discharge, genital sores, vaginal pain and pelvic pain.  Skin: Positive for rash.  Neurological: Negative for dizziness, tremors, seizures, syncope, facial asymmetry, speech difficulty, weakness, light-headedness, numbness and headaches.    Past Medical History  Diagnosis Date  . Anxiety   . Bladder pain   . History of pelvic inflammatory disease   . History of gastritis     mild per EGD 03-26-2015  . Major depressive disorder (HCC)   . History of panic attacks   . Urgency of urination   . GERD  (gastroesophageal reflux disease)   . Vitamin D deficiency   . UTI (lower urinary tract infection)     dx 11-08-2015  TREATED W/ KEFLEX  . Yeast infection     dx 11-08-2015  TREATED W/ DIFLUCAN  . Dysuria   . Meatal stenosis    Past Surgical History  Procedure Laterality Date  . Esophagogastroduodenoscopy  03-26-2015  . Cystoscopy with hydrodistension and biopsy N/A 06/27/2015    Procedure: CYSTOSCOPY/BLADDER BIOPSY/HYDRODISTENSION AND  INSTILLATION OF MARCAINE AND PYRIDIUM;  Surgeon: Ihor Gully, MD;  Location: [facility];  Service: Urology;  Laterality: N/A;  . Cystoscopy with urethral dilatation N/A 11/14/2015    Procedure: CYSTOSCOPY WITH Balloon URETHRAL DILATATION;  Surgeon: Ihor Gully, MD;  Location: [facility];  Service: Urology;  Laterality: N/A;   Allergies  Allergen Reactions  . Almond Oil Rash  . Doxycycline Nausea Only  . Peanuts [Peanut Oil] Rash    Social History   Social History  . Marital Status: Single    Spouse Name: N/A  . Number of Children: 0  . Years of Education: N/A   Occupational History  . Doctor-PHD [employer]    Social History Main Topics  . Smoking status: Never Smoker   . Smokeless tobacco: Never Used  . Alcohol Use: No  . Drug Use: No  . Sexual Activity: Not on file   Other Topics Concern  . Not on file   Social History Narrative   Marital status: engaged      Children: none      Lives: alone  Employment: completed PhD; working at [employer]; postdoctorate degree      Tobacco: none      Alcohol: socially      Exercise:  Not currently; before twice per week.     Family History  Problem Relation Age of Onset  . Colon cancer Neg Hx   . Esophageal cancer Neg Hx   . Rectal cancer Neg Hx   . Stomach cancer Neg Hx   . Kidney disease Maternal Aunt   . Heart disease Maternal Aunt   . Gallbladder disease Maternal Aunt        Objective:    BP 120/86 mmHg  Pulse 90  Temp(Src) 98.2 F (36.8 C)  (Oral)  Resp 16  Ht 5' 0.5" (1.537 m)  Wt 89 lb 3.2 oz (40.461 kg)  BMI 17.13 kg/m2  SpO2 99%  LMP 11/13/2015 Physical Exam  Constitutional: She is oriented to person, place, and time. She appears well-developed and well-nourished. No distress.  HENT:  Head: Normocephalic and atraumatic.  Right Ear: External ear normal.  Left Ear: External ear normal.  Nose: Nose normal.  Mouth/Throat: Oropharynx is clear and moist.  Eyes: Conjunctivae and EOM are normal. Pupils are equal, round, and reactive to light.  Neck: Normal range of motion. Neck supple. Carotid bruit is not present. No thyromegaly present.  Cardiovascular: Normal rate, regular rhythm, normal heart sounds and intact distal pulses.  Exam reveals no gallop and no friction rub.   No murmur heard. Pulmonary/Chest: Effort normal and breath sounds normal. She has no wheezes. She has no rales.  Abdominal: Soft. Bowel sounds are normal. She exhibits no distension and no mass. There is no tenderness. There is no rebound and no guarding.  Lymphadenopathy:    She has no cervical adenopathy.  Neurological: She is alert and oriented to person, place, and time. No cranial nerve deficit.  Skin: Skin is warm and dry. No rash noted. She is not diaphoretic. No erythema. No pallor.  Psychiatric: She has a normal mood and affect. Her behavior is normal.   Results for orders placed or performed during the hospital encounter of 11/19/15  Urine culture  Result Value Ref Range   Specimen Description URINE, CLEAN CATCH    Special Requests Normal    Culture      NO GROWTH 1 DAY Performed at [facility]    Report Status 11/20/2015 FINAL   Culture, blood (Routine X 2) w Reflex to ID Panel  Result Value Ref Range   Specimen Description BLOOD LEFT ANTECUBITAL    Special Requests BOTTLES DRAWN AEROBIC AND ANAEROBIC    Culture      NO GROWTH 3 DAYS Performed at [facility]    Report Status PENDING   Culture, blood (Routine X  2) w Reflex to ID Panel  Result Value Ref Range   Specimen Description BLOOD LEFT HAND    Special Requests IN PEDIATRIC BOTTLE    Culture      NO GROWTH 3 DAYS Performed at [facility]    Report Status PENDING   Wet prep, genital  Result Value Ref Range   Yeast Wet Prep HPF POC NONE SEEN NONE SEEN   Trich, Wet Prep NONE SEEN NONE SEEN   Clue Cells Wet Prep HPF POC NONE SEEN NONE SEEN   WBC, Wet Prep HPF POC FEW (A) NONE SEEN   Sperm NONE SEEN   CBC with Differential/Platelet  Result Value Ref Range   WBC 7.5  4.0 - 10.5 K/uL   RBC 3.96 3.87 - 5.11 MIL/uL   Hemoglobin 12.0 12.0 - 15.0 g/dL   HCT 16.1 09.6 - 04.5 %   MCV 93.2 78.0 - 100.0 fL   MCH 30.3 26.0 - 34.0 pg   MCHC 32.5 30.0 - 36.0 g/dL   RDW 40.9 81.1 - 91.4 %   Platelets 261 150 - 400 K/uL   Neutrophils Relative % 70 %   Neutro Abs 5.2 1.7 - 7.7 K/uL   Lymphocytes Relative 22 %   Lymphs Abs 1.7 0.7 - 4.0 K/uL   Monocytes Relative 6 %   Monocytes Absolute 0.4 0.1 - 1.0 K/uL   Eosinophils Relative 2 %   Eosinophils Absolute 0.1 0.0 - 0.7 K/uL   Basophils Relative 0 %   Basophils Absolute 0.0 0.0 - 0.1 K/uL  Comprehensive metabolic panel  Result Value Ref Range   Sodium 139 135 - 145 mmol/L   Potassium 4.2 3.5 - 5.1 mmol/L   Chloride 108 101 - 111 mmol/L   CO2 24 22 - 32 mmol/L   Glucose, Bld 94 65 - 99 mg/dL   BUN 15 6 - 20 mg/dL   Creatinine, Ser 7.82 0.44 - 1.00 mg/dL   Calcium 8.2 (L) 8.9 - 10.3 mg/dL   Total Protein 6.1 (L) 6.5 - 8.1 g/dL   Albumin 3.6 3.5 - 5.0 g/dL   AST 67 (H) 15 - 41 U/L   ALT 116 (H) 14 - 54 U/L   Alkaline Phosphatase 65 38 - 126 U/L   Total Bilirubin 0.8 0.3 - 1.2 mg/dL   GFR calc non Af Amer >60 >60 mL/min   GFR calc Af Amer >60 >60 mL/min   Anion gap 7 5 - 15  Lipase, blood  Result Value Ref Range   Lipase 27 11 - 51 U/L  Urinalysis, Routine w reflex microscopic (not at [facility])  Result Value Ref Range   Color, Urine ORANGE (A) YELLOW   APPearance CLEAR CLEAR    Specific Gravity, Urine 1.015 1.005 - 1.030   pH 6.5 5.0 - 8.0   Glucose, UA NEGATIVE NEGATIVE mg/dL   Hgb urine dipstick NEGATIVE NEGATIVE   Bilirubin Urine NEGATIVE NEGATIVE   Ketones, ur NEGATIVE NEGATIVE mg/dL   Protein, ur NEGATIVE NEGATIVE mg/dL   Nitrite POSITIVE (A) NEGATIVE   Leukocytes, UA TRACE (A) NEGATIVE  Urine microscopic-add on  Result Value Ref Range   Squamous Epithelial / LPF 0-5 (A) NONE SEEN   WBC, UA 0-5 0 - 5 WBC/hpf   RBC / HPF NONE SEEN 0 - 5 RBC/hpf   Bacteria, UA FEW (A) NONE SEEN  I-Stat Beta hCG blood, ED ([facility], [facility], [facility] only)  Result Value Ref Range   I-stat hCG, quantitative <5.0 <5 mIU/mL   Comment 3          I-Stat CG4 Lactic Acid, ED  Result Value Ref Range   Lactic Acid, Venous 1.19 0.5 - 2.0 mmol/L  I-Stat CG4 Lactic Acid, ED  Result Value Ref Range   Lactic Acid, Venous 1.12 0.5 - 2.0 mmol/L  GC/Chlamydia probe amp ([place])not at [facility]  Result Value Ref Range   Chlamydia Negative    Neisseria gonorrhea Negative        Assessment & Plan:   1. Allergic reaction, initial encounter   2. Dysuria   3. Rash   4. Other specified causes of urethral stricture     Orders Placed This Encounter  Procedures  .  Urine culture  . POCT urinalysis dipstick  . POCT Microscopic Urinalysis ([facility])   Meds ordered this encounter  Medications  . cetirizine (ZYRTEC) tablet 10 mg    Sig:   . ranitidine (ZANTAC) tablet 150 mg    Sig:   . diphenhydrAMINE (BENADRYL) capsule 25 mg    Sig:     No Follow-up on file.    Kristi Paulita Fujita, M.D. [facility] [address] [place], [place]  [address] [phone] phone [phone] fax

## 2015-11-22 NOTE — Patient Instructions (Signed)
1. Claritin  one tablet daily. 2.  Benadryl  every six hours as needed.     Allergies An allergy is an abnormal reaction to a substance by the body's defense system (immune system). Allergies can develop at any age. WHAT CAUSES ALLERGIES? An allergic reaction happens when the immune system mistakenly reacts to a normally harmless substance, called an allergen, as if it were harmful. The immune system releases antibodies to fight the substance. Antibodies eventually release a chemical called histamine into the bloodstream. The release of histamine is meant to protect the body from infection, but it also causes discomfort. An allergic reaction can be triggered by:  Eating an allergen.  Inhaling an allergen.  Touching an allergen. WHAT TYPES OF ALLERGIES ARE THERE? There are many types of allergies. Common types include:  Seasonal allergies. People with this type of allergy are usually allergic to substances that are only present during certain seasons, such as molds and pollens.  Food allergies.  Drug allergies.  Insect allergies.  Animal dander allergies. WHAT ARE SYMPTOMS OF ALLERGIES? Possible allergy symptoms include:  Swelling of the lips, face, tongue, mouth, or throat.  Sneezing, coughing, or wheezing.  Nasal congestion.  Tingling in the mouth.  Rash.  Itching.  Itchy, red, swollen areas of skin (hives).  Watery eyes.  Vomiting.  Diarrhea.  Dizziness.  Lightheadedness.  Fainting.  Trouble breathing or swallowing.  Chest tightness.  Rapid heartbeat. HOW ARE ALLERGIES DIAGNOSED? Allergies are diagnosed with a medical and family history and one or more of the following:  Skin tests.  Blood tests.  A food diary. A food diary is a record of all the foods and drinks you have in a day and of all the symptoms you experience.  The results of an elimination diet. An elimination diet involves eliminating foods from your diet and then adding them  back in one by one to find out if a certain food causes an allergic reaction. HOW ARE ALLERGIES TREATED? There is no cure for allergies, but allergic reactions can be treated with medicine. Severe reactions usually need to be treated at a hospital. HOW CAN REACTIONS BE PREVENTED? The best way to prevent an allergic reaction is by avoiding the substance you are allergic to. Allergy shots and medicines can also help prevent reactions in some cases. People with severe allergic reactions may be able to prevent a life-threatening reaction called anaphylaxis with a medicine given right after exposure to the allergen.   This information is not intended to replace advice given to you by your health care provider. Make sure you discuss any questions you have with your health care provider.   Document Released: 01/04/2003 Document Revised: 11/01/2014 Document Reviewed: 07/23/2014 Elsevier Interactive Patient Education [employer]

## 2015-11-23 LAB — URINE CULTURE: Colony Count: 2000

## 2015-11-24 LAB — CULTURE, BLOOD (ROUTINE X 2)
CULTURE: NO GROWTH
Culture: NO GROWTH

## 2015-11-27 ENCOUNTER — Ambulatory Visit (INDEPENDENT_AMBULATORY_CARE_PROVIDER_SITE_OTHER): Payer: No Typology Code available for payment source | Admitting: Family Medicine

## 2015-11-27 VITALS — BP 120/80 | HR 92 | Temp 98.6°F | Resp 18 | Ht 60.5 in | Wt 89.0 lb

## 2015-11-27 DIAGNOSIS — N3090 Cystitis, unspecified without hematuria: Secondary | ICD-10-CM | POA: Diagnosis not present

## 2015-11-27 DIAGNOSIS — L500 Allergic urticaria: Secondary | ICD-10-CM | POA: Diagnosis not present

## 2015-11-27 LAB — POC MICROSCOPIC URINALYSIS (UMFC): MUCUS RE: ABSENT

## 2015-11-27 LAB — POCT URINALYSIS DIP (MANUAL ENTRY)
BILIRUBIN UA: NEGATIVE
Blood, UA: NEGATIVE
Glucose, UA: NEGATIVE
Ketones, POC UA: NEGATIVE
LEUKOCYTES UA: NEGATIVE
NITRITE UA: NEGATIVE
PH UA: 7
Protein Ur, POC: NEGATIVE
Spec Grav, UA: 1.025
Urobilinogen, UA: 0.2

## 2015-11-27 MED ORDER — METHYLPREDNISOLONE ACETATE 80 MG/ML IJ SUSP
80.0000 mg | Freq: Once | INTRAMUSCULAR | Status: AC
  Administered 2015-11-27: 80 mg via INTRAMUSCULAR

## 2015-11-27 MED ORDER — PREDNISONE 10 MG PO TABS: ORAL_TABLET | ORAL | Status: DC

## 2015-11-27 NOTE — Progress Notes (Signed)
Patient ID: Dawn Price, female    DOB: Nov 19, 1985  Age: 30 y.o. MRN: [id number]  Chief Complaint  Patient presents with  . Follow-up    allergic reaction    Subjective:   Patient had a urinary tract infection treated elsewhere, came in last week with what appeared to be hives from allergic reaction to nitrofurantoin. She was treated with antihistamines for the rash and take off the medication. A couple days later, on Tuesday, she was started on Bactrim. She has taken 4 doses of that. She continues to have hives and itching. She has a little dysuria still  Current allergies, medications, problem list, past/family and social histories reviewed.  Objective:  BP 120/80 mmHg  Pulse 92  Temp(Src) 98.6 F (37 C) (Oral)  Resp 18  Ht 5' 0.5" (1.537 m)  Wt 89 lb (40.37 kg)  BMI 17.09 kg/m2  SpO2 97%  LMP 11/13/2015  Urticarial rash on left scapula and left upper chest with some dermatographia. No CVA tenderness. Abdomen soft without mass or tenderness.  Assessment & Plan:   Assessment: 1. Allergic urticaria   2. Cystitis     Results for orders placed or performed in visit on 11/27/15  POCT urinalysis dipstick  Result Value Ref Range   Color, UA yellow yellow   Clarity, UA clear clear   Glucose, UA negative negative   Bilirubin, UA negative negative   Ketones, POC UA negative negative   Spec Grav, UA 1.025    Blood, UA negative negative   pH, UA 7.0    Protein Ur, POC negative negative   Urobilinogen, UA 0.2    Nitrite, UA Negative Negative   Leukocytes, UA Negative Negative  POCT Microscopic Urinalysis ([facility])  Result Value Ref Range   WBC,UR,HPF,POC None None WBC/hpf   RBC,UR,HPF,POC None None RBC/hpf   Bacteria None None, Too numerous to count   Mucus Absent Absent   Epithelial Cells, UR Per Microscopy Few (A) None, Too numerous to count cells/hpf     Plan: UTI seems to be resolved. I think his best to take her off the sulfa. Treated with some prednisone and  cetirizine and ranitidine. Talked her about hives. Talk to her about urinary tract infection.  Orders Placed This Encounter  Procedures  . POCT urinalysis dipstick  . POCT Microscopic Urinalysis ([facility])    Meds ordered this encounter  Medications  . methylPREDNISolone acetate (DEPO-MEDROL) injection 80 mg    Sig:          There are no Patient Instructions on file for this visit.   No Follow-up on file.   HOPPER,DAVID, MD 11/27/2015

## 2015-11-27 NOTE — Patient Instructions (Signed)
Stop the Bactrim  Take prednisone 20 mg daily for 4 days, then 10 mg daily for 4 days  Take ranitidine 150 mg twice daily for 5 days, then 1 daily  Stop the Benadryl   takeZyrtec (cetirizine) 10 mg daily at bedtime  Return if not improving or if further urinary symptoms  Hives Hives are itchy, red, swollen areas of the skin. They can vary in size and location on your body. Hives can come and go for hours or several days (acute hives) or for several weeks (chronic hives). Hives do not spread from person to person (noncontagious). They may get worse with scratching, exercise, and emotional stress. CAUSES   Allergic reaction to food, additives, or drugs.  Infections, including the common cold.  Illness, such as vasculitis, lupus, or thyroid disease.  Exposure to sunlight, heat, or cold.  Exercise.  Stress.  Contact with chemicals. SYMPTOMS   Red or white swollen patches on the skin. The patches may change size, shape, and location quickly and repeatedly.  Itching.  Swelling of the hands, feet, and face. This may occur if hives develop deeper in the skin. DIAGNOSIS  Your caregiver can usually tell what is wrong by performing a physical exam. Skin or blood tests may also be done to determine the cause of your hives. In some cases, the cause cannot be determined. TREATMENT  Mild cases usually get better with medicines such as antihistamines. Severe cases may require an emergency epinephrine injection. If the cause of your hives is known, treatment includes avoiding that trigger.  HOME CARE INSTRUCTIONS   Avoid causes that trigger your hives.  Take antihistamines as directed by your caregiver to reduce the severity of your hives. Non-sedating or low-sedating antihistamines are usually recommended. Do not drive while taking an antihistamine.  Take any other medicines prescribed for itching as directed by your caregiver.  Wear loose-fitting clothing.  Keep all follow-up  appointments as directed by your caregiver. SEEK MEDICAL CARE IF:   You have persistent or severe itching that is not relieved with medicine.  You have painful or swollen joints. SEEK IMMEDIATE MEDICAL CARE IF:   You have a fever.  Your tongue or lips are swollen.  You have trouble breathing or swallowing.  You feel tightness in the throat or chest.  You have abdominal pain. These problems may be the first sign of a life-threatening allergic reaction. Call your local emergency services (911 in [place] MAKE SURE YOU:   Understand these instructions.  Will watch your condition.  Will get help right away if you are not doing well or get worse.   This information is not intended to replace advice given to you by your health care provider. Make sure you discuss any questions you have with your health care provider.   Document Released: 10/11/2005 Document Revised: 10/16/2013 Document Reviewed: 01/04/2012 Elsevier Interactive Patient Education [employer]

## 2016-01-23 ENCOUNTER — Ambulatory Visit: Payer: No Typology Code available for payment source

## 2016-01-29 ENCOUNTER — Ambulatory Visit (INDEPENDENT_AMBULATORY_CARE_PROVIDER_SITE_OTHER): Payer: No Typology Code available for payment source | Admitting: Family Medicine

## 2016-01-29 VITALS — BP 98/60 | HR 60 | Temp 97.6°F | Resp 16 | Ht 60.0 in | Wt 92.0 lb

## 2016-01-29 DIAGNOSIS — K219 Gastro-esophageal reflux disease without esophagitis: Secondary | ICD-10-CM

## 2016-01-29 DIAGNOSIS — N926 Irregular menstruation, unspecified: Secondary | ICD-10-CM | POA: Diagnosis not present

## 2016-01-29 DIAGNOSIS — R590 Localized enlarged lymph nodes: Secondary | ICD-10-CM | POA: Diagnosis not present

## 2016-01-29 DIAGNOSIS — Z8639 Personal history of other endocrine, nutritional and metabolic disease: Secondary | ICD-10-CM

## 2016-01-29 LAB — POCT CBC
Granulocyte percent: 71 %G (ref 37–80)
HEMATOCRIT: 36.3 % — AB (ref 37.7–47.9)
Hemoglobin: 12.7 g/dL (ref 12.2–16.2)
LYMPH, POC: 2.1 (ref 0.6–3.4)
MCH, POC: 31.1 pg (ref 27–31.2)
MCHC: 34.9 g/dL (ref 31.8–35.4)
MCV: 89 fL (ref 80–97)
MID (CBC): 0.3 (ref 0–0.9)
MPV: 6.5 fL (ref 0–99.8)
POC GRANULOCYTE: 5.8 (ref 2–6.9)
POC LYMPH %: 25.4 % (ref 10–50)
POC MID %: 3.6 % (ref 0–12)
Platelet Count, POC: 288 10*3/uL (ref 142–424)
RBC: 4.08 M/uL (ref 4.04–5.48)
RDW, POC: 12.2 %
WBC: 8.2 10*3/uL (ref 4.6–10.2)

## 2016-01-29 LAB — COMPLETE METABOLIC PANEL WITH GFR
ALBUMIN: 4.3 g/dL (ref 3.6–5.1)
ALK PHOS: 63 U/L (ref 33–115)
ALT: 15 U/L (ref 6–29)
AST: 17 U/L (ref 10–30)
BILIRUBIN TOTAL: 0.6 mg/dL (ref 0.2–1.2)
BUN: 5 mg/dL — ABNORMAL LOW (ref 7–25)
CALCIUM: 9.4 mg/dL (ref 8.6–10.2)
CO2: 25 mmol/L (ref 20–31)
CREATININE: 0.58 mg/dL (ref 0.50–1.10)
Chloride: 104 mmol/L (ref 98–110)
Glucose, Bld: 77 mg/dL (ref 65–99)
Potassium: 4.6 mmol/L (ref 3.5–5.3)
Sodium: 139 mmol/L (ref 135–146)
TOTAL PROTEIN: 7.3 g/dL (ref 6.1–8.1)

## 2016-01-29 MED ORDER — CEPHALEXIN 500 MG PO CAPS: 500.0000 mg | ORAL_CAPSULE | Freq: Three times a day (TID) | ORAL | Status: DC

## 2016-01-29 MED ORDER — PANTOPRAZOLE SODIUM 40 MG PO TBEC: 40.0000 mg | DELAYED_RELEASE_TABLET | Freq: Every day | ORAL | Status: DC

## 2016-01-29 NOTE — Progress Notes (Signed)
Patient ID: Dawn Price, female    DOB: 02-28-1986  Age: 30 y.o. MRN: [id number]  Chief Complaint  Patient presents with  . Heartburn    pt states she has been burping more than usual  . other    period is irregular  . other    pt states there are knots under arms    Subjective:   Patient is here for several things. She's been having a lot of burping in the daytime and reflux symptoms at night. She had an EGD last year which is good. She has been having nodes under her arms concerning her. She went to another doctor elsewhere apparently and nothing significant was found and she was told to come here. She has been having irregular menses, some days 10 days early, sometimes 10 days late. Otherwise she is doing fairly well. She has a history of vitamin D deficiency and would like it rechecked.   Current allergies, medications, problem list, past/family and social histories reviewed.  Objective:  BP 98/60 mmHg  Pulse 60  Temp(Src) 97.6 F (36.4 C) (Oral)  Resp 16  Ht 5' (1.524 m)  Wt 92 lb (41.731 kg)  BMI 17.97 kg/m2  SpO2 97%  LMP 01/16/2016  Alert and oriented. Throat clear. Neck supple without any anterior nodes but has 1 small left posterior cervical node behind the sternocleidomastoid muscle. Her chest is clear to auscultation. She apparently had a chest x-ray last week which is normal. No rales or rhonchi. Her heart is regular without murmur. She had a little knot in her right breast but 1 away. No masses could be palpated there. She does have bilateral axillary lymphadenopathy about 1 x 2 cm. Small lymphadenopathy along the inguinal line also. No hepatosplenomegaly.    Assessment & Plan:   Assessment: 1. History of vitamin D deficiency   2. Lymphadenopathy, axillary   3. Gastroesophageal reflux disease, esophagitis presence not specified   4. Irregular menstrual cycle       Plan: See orders and instructions  Orders Placed This Encounter  Procedures  .  COMPLETE METABOLIC PANEL WITH GFR  . VITAMIN D 25 Hydroxy (Vit-D Deficiency, Fractures)  . Epstein-Barr virus VCA antibody panel  . H. pylori breath test  . POCT CBC    Meds ordered this encounter  Medications  . pantoprazole (PROTONIX) 40 MG tablet    Sig: Take 1 tablet (40 mg total) by mouth daily.    Dispense:  30 tablet    Refill:  3  . cephALEXin (KEFLEX) 500 MG capsule    Sig: Take 1 capsule (500 mg total) by mouth 3 (three) times daily.    Dispense:  21 capsule    Refill:  0         Patient Instructions   Take cephalexin (Keflex) 500 mg 3 times daily for the swollen lymph glands  Take the pantoprazole (Protonix) 40 mg 1 each evening for reflux  Continue to try to avoid excessive spicy foods  If you keep having reflux problems we may need to send you back to the gastroenterologist for a repeat evaluation  I will let you know the results of your labs  Try to get out in the sunlight some which is good for your vitamin D  For now I do not think you need to do anything about the irregular menses.  Return as needed. One of the other physicians here can see you. Or you can look to other practices such as [facility]   IF you received an x-ray today, you will receive an invoice from [facility]. Please contact [facility] at [phone] with questions or concerns regarding your invoice.   IF you received labwork today, you will receive an invoice from [employer]. Please contact [facility] at [phone] with questions or concerns regarding your invoice.   Our billing staff will not be able to assist you with questions regarding bills from these companies.  You will be contacted with the lab results as soon as they are available. The fastest way to get your results is to activate your My Chart account. Instructions are located on the last page of this paperwork. If you have not heard from [place] regarding the results  in 2 weeks, please contact this office.          Return if symptoms worsen or fail to improve.   Petina Muraski, MD 01/29/2016

## 2016-01-29 NOTE — Patient Instructions (Addendum)
Take cephalexin (Keflex) 500 mg 3 times daily for the swollen lymph glands  Take the pantoprazole (Protonix) 40 mg 1 each evening for reflux  Continue to try to avoid excessive spicy foods  If you keep having reflux problems we may need to send you back to the gastroenterologist for a repeat evaluation  I will let you know the results of your labs  Try to get out in the sunlight some which is good for your vitamin D  For now I do not think you need to do anything about the irregular menses.  Return as needed. One of the other physicians here can see you. Or you can look to other practices such as [facility]   IF you received an x-ray today, you will receive an invoice from [facility]. Please contact [facility] at [phone] with questions or concerns regarding your invoice.   IF you received labwork today, you will receive an invoice from [facility]. Please contact [facility] at [phone] with questions or concerns regarding your invoice.   Our billing staff will not be able to assist you with questions regarding bills from these companies.  You will be contacted with the lab results as soon as they are available. The fastest way to get your results is to activate your My Chart account. Instructions are located on the last page of this paperwork. If you have not heard from us regarding the results in 2 weeks, please contact this office.

## 2016-01-30 LAB — H. PYLORI BREATH TEST: H. PYLORI BREATH TEST: NOT DETECTED

## 2016-01-30 LAB — VITAMIN D 25 HYDROXY (VIT D DEFICIENCY, FRACTURES): Vit D, 25-Hydroxy: 21 ng/mL — ABNORMAL LOW (ref 30–100)

## 2016-01-30 LAB — EPSTEIN-BARR VIRUS VCA ANTIBODY PANEL
EBV EA IgG: 5.3 U/mL (ref <9.0)
EBV NA IgG: 419 U/mL — ABNORMAL HIGH (ref <18.0)
EBV VCA IGG: 205 U/mL — AB (ref <18.0)

## 2016-02-10 ENCOUNTER — Encounter (HOSPITAL_COMMUNITY): Payer: Self-pay | Admitting: *Deleted

## 2016-02-10 ENCOUNTER — Ambulatory Visit: Payer: No Typology Code available for payment source

## 2016-02-10 ENCOUNTER — Ambulatory Visit (HOSPITAL_COMMUNITY)
Admission: EM | Admit: 2016-02-10 | Discharge: 2016-02-10 | Disposition: A | Discharge status: Home / Self Care | Payer: PRIVATE HEALTH INSURANCE | Attending: Family Medicine | Admitting: Family Medicine

## 2016-02-10 ENCOUNTER — Telehealth: Payer: Self-pay | Admitting: Family Medicine

## 2016-02-10 DIAGNOSIS — L042 Acute lymphadenitis of upper limb: Secondary | ICD-10-CM | POA: Diagnosis not present

## 2016-02-10 MED ORDER — SULFAMETHOXAZOLE-TRIMETHOPRIM 800-160 MG PO TABS: 1.0000 | ORAL_TABLET | Freq: Two times a day (BID) | ORAL | Status: AC

## 2016-02-10 NOTE — ED Provider Notes (Signed)
CSN: [id number]     Arrival date & time 02/10/16  1313 History   None    Chief Complaint  Patient presents with  . Lymphadenopathy   (Consider location/radiation/quality/duration/timing/severity/associated sxs/prior Treatment) Patient is a 30 y.o. female presenting with shoulder pain. The history is provided by the patient.  Shoulder Pain Location:  Arm Time since incident:  1 month Arm location:  R upper arm Pain details:    Quality:  Sharp   Radiates to:  Does not radiate   Severity:  Moderate   Onset quality:  Gradual   Progression:  Unchanged (seen by lmd 66mo ago and given keflex without change in sx,) Chronicity:  New Relieved by:  Nothing   Past Medical History  Diagnosis Date  . Anxiety   . Bladder pain   . History of pelvic inflammatory disease   . History of gastritis     mild per EGD 03-26-2015  . Major depressive disorder (HCC)   . History of panic attacks   . Urgency of urination   . GERD (gastroesophageal reflux disease)   . Vitamin D deficiency   . UTI (lower urinary tract infection)     dx 11-08-2015  TREATED W/ KEFLEX  . Yeast infection     dx 11-08-2015  TREATED W/ DIFLUCAN  . Dysuria   . Meatal stenosis    Past Surgical History  Procedure Laterality Date  . Esophagogastroduodenoscopy  03-26-2015  . Cystoscopy with hydrodistension and biopsy N/A 06/27/2015    Procedure: CYSTOSCOPY/BLADDER BIOPSY/HYDRODISTENSION AND  INSTILLATION OF MARCAINE AND PYRIDIUM;  Surgeon: Ihor GullyMark Ottelin, MD;  [facility] [place];  Service: Urology;  Laterality: N/A;  . Cystoscopy with urethral dilatation N/A 11/14/2015    Procedure: CYSTOSCOPY WITH Balloon URETHRAL DILATATION;  Surgeon: Ihor GullyMark Ottelin, MD;  [facility] [place];  Service: Urology;  Laterality: N/A;   Family History  Problem Relation Age of Onset  . Colon cancer Neg Hx   . Esophageal cancer Neg Hx   . Rectal cancer Neg Hx   . Stomach cancer Neg Hx   . Kidney disease Maternal  Aunt   . Heart disease Maternal Aunt   . Gallbladder disease Maternal Aunt    Social History  Substance Use Topics  . Smoking status: Never Smoker   . Smokeless tobacco: Never Used  . Alcohol Use: No   OB History    No data available     Review of Systems  Constitutional: Negative.   Hematological: Positive for adenopathy.  All other systems reviewed and are negative.   Allergies  Almond oil; Doxycycline; and Peanuts  Home Medications   Prior to Admission medications   Medication Sig Start Date End Date Taking? Authorizing Provider  cephALEXin (KEFLEX) 500 MG capsule Take 1 capsule (500 mg total) by mouth 3 (three) times daily. 01/29/16   Peyton Najjaravid H Hopper, MD  ibuprofen (ADVIL,MOTRIN) 200 MG tablet Take 200 mg by mouth every 6 (six) hours as needed (pain). Reported on 01/29/2016    Historical Provider, MD  pantoprazole (PROTONIX) 40 MG tablet Take 1 tablet (40 mg total) by mouth daily. 01/29/16   Peyton Najjaravid H Hopper, MD  phenazopyridine (PYRIDIUM) 200 MG tablet Take 1 tablet (200 mg total) by mouth 3 (three) times daily as needed for pain. Patient not taking: Reported on 01/29/2016 11/14/15   Ihor GullyMark Ottelin, MD  predniSONE (DELTASONE) 10 MG tablet Take 2 daily for 4 days, then 1 daily for 4 days 11/27/15   Peyton Najjaravid H Hopper,  MD   Meds Ordered and Administered this Visit  Medications - No data to display  BP 106/67 mmHg  Pulse 71  Temp(Src) 98.4 F (36.9 C) (Oral)  Resp 16  SpO2 100%  LMP 01/12/2016 No data found.   Physical Exam  Constitutional: She is oriented to person, place, and time. She appears well-developed and well-nourished. She appears distressed.  Neck: Normal range of motion. Neck supple.  Musculoskeletal: She exhibits tenderness.  Tender right ax node, left wnl.  Lymphadenopathy:    She has no cervical adenopathy.  Neurological: She is alert and oriented to person, place, and time.  Skin: Skin is warm and dry.  Nursing note and vitals reviewed.   ED Course   Procedures (including critical care time)  Labs Review Labs Reviewed - No data to display  Imaging Review No results found.   Visual Acuity Review  Right Eye Distance:   Left Eye Distance:   Bilateral Distance:    Right Eye Near:   Left Eye Near:    Bilateral Near:         MDM  No diagnosis found. Meds ordered this encounter  Medications  . sulfamethoxazole-trimethoprim (BACTRIM DS,SEPTRA DS) 800-160 MG tablet    Sig: Take 1 tablet by mouth 2 (two) times daily.    Dispense:  20 tablet    Refill:  0       Linna Hoff, MD 02/10/16 [phone]

## 2016-02-10 NOTE — Telephone Encounter (Signed)
Pt called to sch for a NP appt. Pt was a pt of Dr Alwyn RenHopper. Thank you!

## 2016-02-10 NOTE — Discharge Instructions (Signed)
Take all of medicine and see your doctor if further problems. °

## 2016-02-10 NOTE — ED Notes (Signed)
Pt  Reports   Symptoms  Of      Swollen  Lymph  Nodes      She had  A  Chest x  Ray      Pt  Reports    She   Saw  Dr  Alwyn RenHopper  Recently         And  Just  Finished  A   Course  Of  Anti  Biotics      - keflex     Pt sates  She  Wants  To  Be  Checked  For cancer

## 2016-02-11 ENCOUNTER — Encounter: Payer: Self-pay | Admitting: *Deleted

## 2016-02-22 ENCOUNTER — Emergency Department (HOSPITAL_COMMUNITY)
Admission: EM | Admit: 2016-02-22 | Discharge: 2016-02-22 | Discharge status: Against Medical Advice | Payer: PRIVATE HEALTH INSURANCE

## 2016-02-22 NOTE — ED Notes (Signed)
Pt called for triage  several times. Also looked for pt in restroom and outside.

## 2016-03-11 ENCOUNTER — Other Ambulatory Visit: Payer: Self-pay | Admitting: Family Medicine

## 2016-03-11 DIAGNOSIS — N63 Unspecified lump in unspecified breast: Secondary | ICD-10-CM

## 2016-03-11 DIAGNOSIS — N644 Mastodynia: Secondary | ICD-10-CM

## 2016-03-24 ENCOUNTER — Ambulatory Visit: Payer: Self-pay | Admitting: Internal Medicine

## 2016-03-24 DIAGNOSIS — Z0289 Encounter for other administrative examinations: Secondary | ICD-10-CM

## 2016-03-25 ENCOUNTER — Other Ambulatory Visit: Payer: Self-pay

## 2016-06-04 ENCOUNTER — Ambulatory Visit (HOSPITAL_COMMUNITY)
Admission: EM | Admit: 2016-06-04 | Discharge: 2016-06-04 | Disposition: A | Discharge status: Home / Self Care | Payer: Self-pay | Attending: Family Medicine | Admitting: Family Medicine

## 2016-06-04 ENCOUNTER — Encounter (HOSPITAL_COMMUNITY): Payer: Self-pay | Admitting: Emergency Medicine

## 2016-06-04 DIAGNOSIS — R300 Dysuria: Secondary | ICD-10-CM

## 2016-06-04 DIAGNOSIS — Z881 Allergy status to other antibiotic agents status: Secondary | ICD-10-CM | POA: Insufficient documentation

## 2016-06-04 DIAGNOSIS — K219 Gastro-esophageal reflux disease without esophagitis: Secondary | ICD-10-CM | POA: Insufficient documentation

## 2016-06-04 DIAGNOSIS — Z87440 Personal history of urinary (tract) infections: Secondary | ICD-10-CM | POA: Insufficient documentation

## 2016-06-04 DIAGNOSIS — N342 Other urethritis: Secondary | ICD-10-CM | POA: Insufficient documentation

## 2016-06-04 LAB — COMPREHENSIVE METABOLIC PANEL
ALBUMIN: 4.6 g/dL (ref 3.5–5.0)
ALT: 53 U/L (ref 14–54)
AST: 39 U/L (ref 15–41)
Alkaline Phosphatase: 75 U/L (ref 38–126)
Anion gap: 8 (ref 5–15)
BUN: 9 mg/dL (ref 6–20)
CHLORIDE: 105 mmol/L (ref 101–111)
CO2: 26 mmol/L (ref 22–32)
Calcium: 9.9 mg/dL (ref 8.9–10.3)
Creatinine, Ser: 0.59 mg/dL (ref 0.44–1.00)
GFR calc Af Amer: >60 mL/min (ref >60)
GLUCOSE: 88 mg/dL (ref 65–99)
POTASSIUM: 4.2 mmol/L (ref 3.5–5.1)
Sodium: 139 mmol/L (ref 135–145)
Total Bilirubin: 1.4 mg/dL — ABNORMAL HIGH (ref 0.3–1.2)
Total Protein: 7.6 g/dL (ref 6.5–8.1)

## 2016-06-04 LAB — POCT URINALYSIS DIP (DEVICE)
Bilirubin Urine: NEGATIVE
Glucose, UA: NEGATIVE mg/dL
HGB URINE DIPSTICK: NEGATIVE
Ketones, ur: NEGATIVE mg/dL
Leukocytes, UA: NEGATIVE
NITRITE: NEGATIVE
PH: 6.5 (ref 5.0–8.0)
PROTEIN: NEGATIVE mg/dL
SPECIFIC GRAVITY, URINE: 1.01 (ref 1.005–1.030)
UROBILINOGEN UA: 0.2 mg/dL (ref 0.0–1.0)

## 2016-06-04 LAB — CBC WITH DIFFERENTIAL/PLATELET
BASOS ABS: 0 10*3/uL (ref 0.0–0.1)
BASOS PCT: 0 %
EOS ABS: 0.1 10*3/uL (ref 0.0–0.7)
EOS PCT: 1 %
HCT: 40.1 % (ref 36.0–46.0)
Hemoglobin: 13.2 g/dL (ref 12.0–15.0)
Lymphocytes Relative: 34 %
Lymphs Abs: 1.8 10*3/uL (ref 0.7–4.0)
MCH: 29.5 pg (ref 26.0–34.0)
MCHC: 32.9 g/dL (ref 30.0–36.0)
MCV: 89.5 fL (ref 78.0–100.0)
MONO ABS: 0.3 10*3/uL (ref 0.1–1.0)
Monocytes Relative: 6 %
Neutro Abs: 3 10*3/uL (ref 1.7–7.7)
Neutrophils Relative %: 59 %
PLATELETS: 298 10*3/uL (ref 150–400)
RBC: 4.48 MIL/uL (ref 3.87–5.11)
RDW: 11.9 % (ref 11.5–15.5)
WBC: 5.2 10*3/uL (ref 4.0–10.5)

## 2016-06-04 LAB — POCT PREGNANCY, URINE: Preg Test, Ur: NEGATIVE

## 2016-06-04 NOTE — ED Triage Notes (Signed)
Pt c/o UTI sx onset x3 weeks  Sx include: dysuria, cloudy urine w/a foul odor Denies vag d/c, f/v/n/, back pain  Also would like to be checked for HIV... Reports 15 lbs wt loss onset 3 months... Reports she works at [employer] in [place] [employer] [place] and is working w/live HIV virus.   A&O x4... NAD

## 2016-06-04 NOTE — ED Provider Notes (Signed)
CSN: [id number]     Arrival date & time 06/04/16  1111 History   None    Chief Complaint  Patient presents with  . Recurrent UTI  . Exposure to STD   (Consider location/radiation/quality/duration/timing/severity/associated sxs/prior Treatment) HPI 30 year old female from [place] since with 2 week history of dysuria since cystoscopy. She states that whenever she urinates she has some burning. She has noticed no blood in her urine patient denies sexual activity at this time. She states that she has had no pelvic discharge. Past Medical History:  Diagnosis Date  . Anxiety   . Bladder pain   . Dysuria   . GERD (gastroesophageal reflux disease)   . History of gastritis    mild per EGD 03-26-2015  . History of panic attacks   . History of pelvic inflammatory disease   . Major depressive disorder (HCC)   . Meatal stenosis   . Urgency of urination   . UTI (lower urinary tract infection)    dx 11-08-2015  TREATED W/ KEFLEX  . Vitamin D deficiency   . Yeast infection    dx 11-08-2015  TREATED W/ DIFLUCAN   Past Surgical History:  Procedure Laterality Date  . CYSTOSCOPY WITH HYDRODISTENSION AND BIOPSY N/A 06/27/2015   Procedure: CYSTOSCOPY/BLADDER BIOPSY/HYDRODISTENSION AND  INSTILLATION OF MARCAINE AND PYRIDIUM;  Surgeon: Ihor Gully, MD;  Location: [facility] [place];  Service: Urology;  Laterality: N/A;  . CYSTOSCOPY WITH URETHRAL DILATATION N/A 11/14/2015   Procedure: CYSTOSCOPY WITH Balloon URETHRAL DILATATION;  Surgeon: Ihor Gully, MD;  Location: [facility] [place];  Service: Urology;  Laterality: N/A;  . ESOPHAGOGASTRODUODENOSCOPY  03-26-2015   Family History  Problem Relation Age of Onset  . Kidney disease Maternal Aunt   . Heart disease Maternal Aunt   . Gallbladder disease Maternal Aunt   . Colon cancer Neg Hx   . Esophageal cancer Neg Hx   . Rectal cancer Neg Hx   . Stomach cancer Neg Hx    Social History  Substance Use Topics  . Smoking status:  Never Smoker  . Smokeless tobacco: Never Used  . Alcohol use No   OB History    Gravida Para Term Preterm AB Living   1             SAB TAB Ectopic Multiple Live Births                 Review of Systems  Denies: HEADACHE, NAUSEA, ABDOMINAL PAIN, CHEST PAIN, CONGESTION, DYSURIA, SHORTNESS OF BREATH  Allergies  Almond oil; Doxycycline; and Peanuts [peanut oil]  Home Medications   Prior to Admission medications   Medication Sig Start Date End Date Taking? Authorizing Provider  omeprazole (PRILOSEC) 20 MG capsule Take 20 mg by mouth daily.   Yes Historical Provider, MD  cephALEXin (KEFLEX) 500 MG capsule Take 1 capsule (500 mg total) by mouth 3 (three) times daily. 01/29/16   Peyton Najjar, MD  ibuprofen (ADVIL,MOTRIN) 200 MG tablet Take 200 mg by mouth every 6 (six) hours as needed (pain). Reported on 01/29/2016    Historical Provider, MD  pantoprazole (PROTONIX) 40 MG tablet Take 1 tablet (40 mg total) by mouth daily. 01/29/16   Peyton Najjar, MD  phenazopyridine (PYRIDIUM) 200 MG tablet Take 1 tablet (200 mg total) by mouth 3 (three) times daily as needed for pain. Patient not taking: Reported on 01/29/2016 11/14/15   Ihor Gully, MD  predniSONE (DELTASONE) 10 MG tablet Take 2 daily for 4 days,  then 1 daily for 4 days 11/27/15   Peyton Najjaravid H Hopper, MD   Meds Ordered and Administered this Visit  Medications - No data to display  BP 105/72 (BP Location: Left Arm)   Pulse 84   Temp 98.2 F (36.8 C) (Oral)   Resp 20   LMP 06/01/2016   SpO2 100%  No data found.   Physical Exam NURSES NOTES AND VITAL SIGNS REVIEWED. CONSTITUTIONAL: Well developed, well nourished, no acute distress HEENT: normocephalic, atraumatic EYES: Conjunctiva normal NECK:normal ROM, supple, no adenopathy PULMONARY:No respiratory distress, normal effort ABDOMINAL: Soft, ND, NT BS+, No CVAT MUSCULOSKELETAL: Normal ROM of all extremities,  SKIN: warm and dry without rash PSYCHIATRIC: Mood and affect, behavior are  normal  Urgent Care Course   Clinical Course    Procedures (including critical care time)  Labs Review Labs Reviewed  URINE CULTURE  CBC WITH DIFFERENTIAL/PLATELET  HIV ANTIBODY (ROUTINE TESTING)  COMPREHENSIVE METABOLIC PANEL  POCT URINALYSIS DIP (DEVICE)  POCT PREGNANCY, URINE    Imaging Review No results found.   Visual Acuity Review  Right Eye Distance:   Left Eye Distance:   Bilateral Distance:    Right Eye Near:   Left Eye Near:    Bilateral Near:        Advised to continue using Pyridium. She may need to have follow-up with her urologist. HIV, CBC, comprehensive metabolic panel requested per patient's request MDM   1. Urethritis   2. Dysuria     Patient is reassured that there are no issues that require transfer to higher level of care at this time or additional tests. Patient is advised to continue home symptomatic treatment. Patient is advised that if there are new or worsening symptoms to attend the emergency department, contact primary care provider, or return to UC. Instructions of care provided discharged home in stable condition.    THIS NOTE WAS GENERATED USING A VOICE RECOGNITION SOFTWARE PROGRAM. ALL REASONABLE EFFORTS  WERE MADE TO PROOFREAD THIS DOCUMENT FOR ACCURACY.  I have verbally reviewed the discharge instructions with the patient. A printed AVS was given to the patient.  All questions were answered prior to discharge.      Tharon AquasFrank C Jonetta Dagley, PA 06/04/16 1450

## 2016-06-04 NOTE — ED Notes (Signed)
Patient discharged by Frank Patrick, PA 

## 2016-06-04 NOTE — Discharge Instructions (Signed)
CONTINUE PYRIDIUM WE WILL FOLLOW UP WITH YOU NEXT WEEK FOR YOUR HIV AND URINE TESTS.

## 2016-06-05 LAB — URINE CULTURE: Culture: 3000 — AB

## 2016-06-05 LAB — HIV ANTIBODY (ROUTINE TESTING W REFLEX): HIV SCREEN 4TH GENERATION: NONREACTIVE

## 2016-06-18 ENCOUNTER — Ambulatory Visit: Payer: Self-pay | Admitting: Internal Medicine

## 2016-07-01 ENCOUNTER — Encounter (HOSPITAL_COMMUNITY): Payer: Self-pay | Admitting: Emergency Medicine

## 2016-07-01 ENCOUNTER — Ambulatory Visit (HOSPITAL_COMMUNITY)
Admission: EM | Admit: 2016-07-01 | Discharge: 2016-07-01 | Disposition: A | Discharge status: Home / Self Care | Payer: Self-pay | Attending: Internal Medicine | Admitting: Internal Medicine

## 2016-07-01 DIAGNOSIS — K2100 Gastro-esophageal reflux disease with esophagitis, without bleeding: Secondary | ICD-10-CM

## 2016-07-01 DIAGNOSIS — K5900 Constipation, unspecified: Secondary | ICD-10-CM

## 2016-07-01 LAB — POCT H PYLORI SCREEN: H. PYLORI SCREEN, POC: NEGATIVE

## 2016-07-01 MED ORDER — LANSOPRAZOLE 15 MG PO CPDR: 15.0000 mg | DELAYED_RELEASE_CAPSULE | Freq: Every day | ORAL | 1 refills | Status: DC

## 2016-07-01 NOTE — ED Notes (Signed)
D/c by frank patrick, pa 

## 2016-07-01 NOTE — ED Triage Notes (Signed)
Pt c/o acid reflux onset 1 month associated w/ST, constipation... Sx worsen w/food  LBM = today; hard  Taking Protonix but not working like they would  A&O x4... NAD

## 2016-07-01 NOTE — ED Notes (Signed)
Patient requesting frank patrick, pa

## 2016-07-01 NOTE — ED Provider Notes (Signed)
CSN: [id number]     Arrival date & time 07/01/16  1452 History   First MD Initiated Contact with Patient 07/01/16 1623     Chief Complaint  Patient presents with  . Gastroesophageal Reflux   (Consider location/radiation/quality/duration/timing/severity/associated sxs/prior Treatment) HPI 30 year old female with a history of reflux states that she has taken her medicine but her reflux is getting worse now. No change in diet and she states that she limit spicy foods attempts not to eat after 9 PM and does prop the head of her bed up. She states that she has been seen by [facility] and has had an endoscopy last year. She also states that she has had white spots in the back of her throat and tongue that she has to brush her teeth at least 3 times a day. There is no history of immunocompromise. Past Medical History:  Diagnosis Date  . Anxiety   . Bladder pain   . Dysuria   . GERD (gastroesophageal reflux disease)   . History of gastritis    mild per EGD 03-26-2015  . History of panic attacks   . History of pelvic inflammatory disease   . Major depressive disorder (HCC)   . Meatal stenosis   . Urgency of urination   . UTI (lower urinary tract infection)    dx 11-08-2015  TREATED W/ KEFLEX  . Vitamin D deficiency   . Yeast infection    dx 11-08-2015  TREATED W/ DIFLUCAN   Past Surgical History:  Procedure Laterality Date  . CYSTOSCOPY WITH HYDRODISTENSION AND BIOPSY N/A 06/27/2015   Procedure: CYSTOSCOPY/BLADDER BIOPSY/HYDRODISTENSION AND  INSTILLATION OF MARCAINE AND PYRIDIUM;  Surgeon: Ihor GullyMark Ottelin, MD;  [facility] [place];  Service: Urology;  Laterality: N/A;  . CYSTOSCOPY WITH URETHRAL DILATATION N/A 11/14/2015   Procedure: CYSTOSCOPY WITH Balloon URETHRAL DILATATION;  Surgeon: Ihor GullyMark Ottelin, MD;  [facility] [place];  Service: Urology;  Laterality: N/A;  . ESOPHAGOGASTRODUODENOSCOPY  03-26-2015   Family History  Problem Relation Age of Onset  . Kidney  disease Maternal Aunt   . Heart disease Maternal Aunt   . Gallbladder disease Maternal Aunt   . Colon cancer Neg Hx   . Esophageal cancer Neg Hx   . Rectal cancer Neg Hx   . Stomach cancer Neg Hx    Social History  Substance Use Topics  . Smoking status: Never Smoker  . Smokeless tobacco: Never Used  . Alcohol use No   OB History    Gravida Para Term Preterm AB Living   1             SAB TAB Ectopic Multiple Live Births                 Review of Systems  Denies: HEADACHE, NAUSEA, ABDOMINAL PAIN, CHEST PAIN, CONGESTION, DYSURIA, SHORTNESS OF BREATH  Allergies  Almond oil; Doxycycline; and Peanuts [peanut oil]  Home Medications   Prior to Admission medications   Medication Sig Start Date End Date Taking? Authorizing Provider  omeprazole (PRILOSEC) 20 MG capsule Take 20 mg by mouth daily.   Yes Historical Provider, MD  cephALEXin (KEFLEX) 500 MG capsule Take 1 capsule (500 mg total) by mouth 3 (three) times daily. 01/29/16   Peyton Najjaravid H Hopper, MD  ibuprofen (ADVIL,MOTRIN) 200 MG tablet Take 200 mg by mouth every 6 (six) hours as needed (pain). Reported on 01/29/2016    Historical Provider, MD  lansoprazole (PREVACID) 15 MG capsule Take 1 capsule (15 mg  total) by mouth daily at 12 noon. 07/01/16   Tharon Aquas, PA  lansoprazole (PREVACID) 15 MG capsule Take 1 capsule (15 mg total) by mouth daily at 12 noon. 07/01/16   Tharon Aquas, PA  pantoprazole (PROTONIX) 40 MG tablet Take 1 tablet (40 mg total) by mouth daily. 01/29/16   Peyton Najjar, MD  phenazopyridine (PYRIDIUM) 200 MG tablet Take 1 tablet (200 mg total) by mouth 3 (three) times daily as needed for pain. Patient not taking: Reported on 01/29/2016 11/14/15   Ihor Gully, MD  predniSONE (DELTASONE) 10 MG tablet Take 2 daily for 4 days, then 1 daily for 4 days 11/27/15   Peyton Najjar, MD   Meds Ordered and Administered this Visit  Medications - No data to display  BP (!) 93/52 (BP Location: Right Arm)   Pulse 72   Temp 98.1 F  (36.7 C) (Oral)   Resp 14   LMP 07/01/2016   SpO2 100%   Breastfeeding? No  No data found.   Physical Exam NURSES NOTES AND VITAL SIGNS REVIEWED. CONSTITUTIONAL: Well developed, well nourished, no acute distress HEENT: normocephalic, atraumatic EYES: Conjunctiva normal NECK:normal ROM, supple, no adenopathy PULMONARY:No respiratory distress, normal effort ABDOMINAL: Soft, ND, NT BS+, No CVAT MUSCULOSKELETAL: Normal ROM of all extremities,  SKIN: warm and dry without rash PSYCHIATRIC: Mood and affect, behavior are normal  Urgent Care Course   Clinical Course    Procedures (including critical care time)  Labs Review Labs Reviewed  POCT H PYLORI SCREEN   Discussed with patient prior to discharge. Imaging Review No results found.   Visual Acuity Review  Right Eye Distance:   Left Eye Distance:   Bilateral Distance:    Right Eye Near:   Left Eye Near:    Bilateral Near:         MDM   1. Gastroesophageal reflux disease with esophagitis   2. Constipation, unspecified constipation type     Patient is reassured that there are no issues that require transfer to higher level of care at this time or additional tests. Patient is advised to continue home symptomatic treatment. Patient is advised that if there are new or worsening symptoms to attend the emergency department, contact primary care provider, or return to UC. Instructions of care provided discharged home in stable condition.    THIS NOTE WAS GENERATED USING A VOICE RECOGNITION SOFTWARE PROGRAM. ALL REASONABLE EFFORTS  WERE MADE TO PROOFREAD THIS DOCUMENT FOR ACCURACY.  I have verbally reviewed the discharge instructions with the patient. A printed AVS was given to the patient.  All questions were answered prior to discharge.      Tharon Aquas, PA 07/01/16 1744

## 2016-07-18 ENCOUNTER — Ambulatory Visit (HOSPITAL_COMMUNITY)
Admission: EM | Admit: 2016-07-18 | Discharge: 2016-07-18 | Disposition: A | Discharge status: Home / Self Care | Payer: Self-pay | Attending: Internal Medicine | Admitting: Internal Medicine

## 2016-07-18 ENCOUNTER — Encounter (HOSPITAL_COMMUNITY): Payer: Self-pay | Admitting: Emergency Medicine

## 2016-07-18 DIAGNOSIS — K219 Gastro-esophageal reflux disease without esophagitis: Secondary | ICD-10-CM

## 2016-07-18 MED ORDER — DEXLANSOPRAZOLE 30 MG PO CPDR: 30.0000 mg | DELAYED_RELEASE_CAPSULE | Freq: Every day | ORAL | 0 refills | Status: DC

## 2016-07-18 MED ORDER — SUCRALFATE 1 G PO TABS: 1.0000 g | ORAL_TABLET | Freq: Three times a day (TID) | ORAL | 0 refills | Status: DC

## 2016-07-18 NOTE — ED Triage Notes (Signed)
PT reports gastric reflux for several months. PT was seen here 3 weeks and prescribed lansoprazole. PT reports the medicine has not been effective and she has had headaches since starting that med. PT has also used prilosec in the past without relief. PT has tried to cut down on spices and caffeine without relief. PT reports pain is now present constantly. PT also reports a "lump" in her throat every morning. PT has an appt with gastro, but cannot be seen until December.

## 2016-07-18 NOTE — Discharge Instructions (Signed)
In addition to the prescribed medication start taking Zantac 150 milligrams twice a day. Continue the diet in which you had been using with decreased acid and spicy foods. Drink plenty of water especially in the morning drink at least 1 full glass of water upon awakening.

## 2016-07-18 NOTE — ED Provider Notes (Signed)
CSN: [id number]     Arrival date & time 07/18/16  1357 History   None    Chief Complaint  Patient presents with  . Abdominal Pain   (Consider location/radiation/quality/duration/timing/severity/associated sxs/prior Treatment) Pleasant 30 year old female who has a history of GERD and reflux symptoms that have some far been refractory to medications. She has been to the emergency department and urgent care for symptoms. She was last seen approximately 3 weeks ago I APAP this urgent care and was prescribed Prevacid. She states this medication is not helping with her symptoms and she is also getting a headache with it. She states that she is having a fiery or burning discomfort in the epigastrium and similar sensations substernally particularly when lying supine. She has stopped eating spicy and acidic foods. She has stopped eating before 9:00. She has taken Prilosec, Protonix and none of these have helped. She has obtained an appointment with a gastroenterologist however it is not until December.      Past Medical History:  Diagnosis Date  . Anxiety   . Bladder pain   . Dysuria   . GERD (gastroesophageal reflux disease)   . History of gastritis    mild per EGD 03-26-2015  . History of panic attacks   . History of pelvic inflammatory disease    PT reports this was misdiagnosed and should be removed.   . Major depressive disorder (HCC)   . Meatal stenosis   . Urgency of urination   . UTI (lower urinary tract infection)    dx 11-08-2015  TREATED W/ KEFLEX  . Vitamin D deficiency   . Yeast infection    dx 11-08-2015  TREATED W/ DIFLUCAN   Past Surgical History:  Procedure Laterality Date  . CYSTOSCOPY WITH HYDRODISTENSION AND BIOPSY N/A 06/27/2015   Procedure: CYSTOSCOPY/BLADDER BIOPSY/HYDRODISTENSION AND  INSTILLATION OF MARCAINE AND PYRIDIUM;  Surgeon: Ihor GullyMark Ottelin, MD;  [facility] [place];  Service: Urology;  Laterality: N/A;  . CYSTOSCOPY WITH URETHRAL DILATATION  N/A 11/14/2015   Procedure: CYSTOSCOPY WITH Balloon URETHRAL DILATATION;  Surgeon: Ihor GullyMark Ottelin, MD;  [facility] [place];  Service: Urology;  Laterality: N/A;  . ESOPHAGOGASTRODUODENOSCOPY  03-26-2015   Family History  Problem Relation Age of Onset  . Kidney disease Maternal Aunt   . Heart disease Maternal Aunt   . Gallbladder disease Maternal Aunt   . Colon cancer Neg Hx   . Esophageal cancer Neg Hx   . Rectal cancer Neg Hx   . Stomach cancer Neg Hx    Social History  Substance Use Topics  . Smoking status: Never Smoker  . Smokeless tobacco: Never Used  . Alcohol use No   OB History    Gravida Para Term Preterm AB Living   1             SAB TAB Ectopic Multiple Live Births                 Review of Systems  Constitutional: Negative for activity change and fever.  HENT: Negative.   Respiratory: Negative.  Negative for cough and shortness of breath.   Cardiovascular: Negative for chest pain.  Gastrointestinal: Negative for abdominal distention, constipation, diarrhea and vomiting.       As per history of present illness  Genitourinary: Negative.   Musculoskeletal: Negative.   Skin: Negative.   Neurological: Negative.   All other systems reviewed and are negative.   Allergies  Macrobid [nitrofurantoin monohyd macro]; Almond oil; Doxycycline; and Peanuts [  peanut oil]  Home Medications   Prior to Admission medications   Medication Sig Start Date End Date Taking? Authorizing Provider  cephALEXin (KEFLEX) 500 MG capsule Take 1 capsule (500 mg total) by mouth 3 (three) times daily. 01/29/16   Peyton Najjar, MD  Dexlansoprazole 30 MG capsule Take 1 capsule (30 mg total) by mouth daily. 07/18/16   Hayden Rasmussen, NP  phenazopyridine (PYRIDIUM) 200 MG tablet Take 1 tablet (200 mg total) by mouth 3 (three) times daily as needed for pain. Patient not taking: Reported on 01/29/2016 11/14/15   Ihor Gully, MD  sucralfate (CARAFATE) 1 g tablet Take 1 tablet (1 g total)  by mouth 4 (four) times daily -  with meals and at bedtime. 07/18/16   Hayden Rasmussen, NP   Meds Ordered and Administered this Visit  Medications - No data to display  BP 99/60   Pulse 75   Temp 98.6 F (37 C) (Oral)   Resp 16   Ht 5' (1.524 m)   Wt 85 lb (38.6 kg)   LMP 07/01/2016   SpO2 100%   BMI 16.60 kg/m  No data found.   Physical Exam  Constitutional: She is oriented to person, place, and time. She appears well-developed and well-nourished. No distress.  HENT:  Head: Normocephalic and atraumatic.  Mouth/Throat: No oropharyngeal exudate.  The oropharynx with minor erythema. No exudates. No white spots are seen on the oropharynx or the tongue. No exudates. No swelling. The irritation appearance to the throat similar to that seen with PND and acid reflux.  Eyes: EOM are normal. Pupils are equal, round, and reactive to light.  Neck: Normal range of motion. Neck supple.  Cardiovascular: Normal rate, normal heart sounds and intact distal pulses.   Pulmonary/Chest: Effort normal and breath sounds normal. No respiratory distress.  Abdominal: Soft. She exhibits no distension and no mass. There is no tenderness. There is no rebound and no guarding.  Musculoskeletal: Normal range of motion. She exhibits no edema.  Neurological: She is alert and oriented to person, place, and time. No cranial nerve deficit.  Skin: Skin is warm and dry.  Psychiatric: She has a normal mood and affect.  Nursing note and vitals reviewed.   Urgent Care Course   Clinical Course    Procedures (including critical care time)  Labs Review Labs Reviewed - No data to display  Imaging Review No results found.   Visual Acuity Review  Right Eye Distance:   Left Eye Distance:   Bilateral Distance:    Right Eye Near:   Left Eye Near:    Bilateral Near:         MDM   1. Gastroesophageal reflux disease, esophagitis presence not specified    In addition to the prescribed medication start taking  Zantac 150 milligrams twice a day. Continue the diet in which you had been using with decreased acid and spicy foods. Drink plenty of water especially in the morning drink at least 1 full glass of water upon awakening. Meds ordered this encounter  Medications  . sucralfate (CARAFATE) 1 g tablet    Sig: Take 1 tablet (1 g total) by mouth 4 (four) times daily -  with meals and at bedtime.    Dispense:  120 tablet    Refill:  0    Order Specific Question:   Supervising Provider    Answer:   Eustace Moore [[id number]]  . Dexlansoprazole 30 MG capsule    Sig: Take 1 capsule (  30 mg total) by mouth daily.    Dispense:  30 capsule    Refill:  0    Order Specific Question:   Supervising Provider    Answer:   Eustace Moore [[id number]]       Hayden Rasmussen, NP 07/18/16 1806

## 2016-08-22 ENCOUNTER — Ambulatory Visit (HOSPITAL_COMMUNITY)
Admission: EM | Admit: 2016-08-22 | Discharge: 2016-08-22 | Disposition: A | Discharge status: Home / Self Care | Payer: No Typology Code available for payment source | Attending: Emergency Medicine | Admitting: Emergency Medicine

## 2016-08-22 ENCOUNTER — Encounter (HOSPITAL_COMMUNITY): Payer: Self-pay | Admitting: Emergency Medicine

## 2016-08-22 DIAGNOSIS — F419 Anxiety disorder, unspecified: Secondary | ICD-10-CM | POA: Diagnosis not present

## 2016-08-22 DIAGNOSIS — R1013 Epigastric pain: Secondary | ICD-10-CM

## 2016-08-22 DIAGNOSIS — K2900 Acute gastritis without bleeding: Secondary | ICD-10-CM

## 2016-08-22 LAB — POCT URINALYSIS DIP (DEVICE)
Bilirubin Urine: NEGATIVE
Glucose, UA: NEGATIVE mg/dL
KETONES UR: 40 mg/dL — AB
Leukocytes, UA: NEGATIVE
NITRITE: NEGATIVE
PH: 5.5 (ref 5.0–8.0)
PROTEIN: NEGATIVE mg/dL
Specific Gravity, Urine: 1.01 (ref 1.005–1.030)
Urobilinogen, UA: 0.2 mg/dL (ref 0.0–1.0)

## 2016-08-22 LAB — POCT PREGNANCY, URINE: PREG TEST UR: NEGATIVE

## 2016-08-22 MED ORDER — HYDROXYZINE HCL 25 MG PO TABS: 25.0000 mg | ORAL_TABLET | Freq: Four times a day (QID) | ORAL | 0 refills | Status: DC

## 2016-08-22 MED ORDER — DEXLANSOPRAZOLE 30 MG PO CPDR: 30.0000 mg | DELAYED_RELEASE_CAPSULE | Freq: Every day | ORAL | 0 refills | Status: DC

## 2016-08-22 NOTE — ED Provider Notes (Signed)
HPI  SUBJECTIVE:  Dawn Price is a 30 y.o. female who presents with burning, nonmigratory nonradiating epigastric abdominal pain for the past several days. She reports that it gets worse with eating. There are no other aggravating or alleviating factors. Has not tried anything for this. She reports nausea and one episode of nonbilious nonbloody emesis today. Has decreased by mouth intake secondary to the pain, but is tolerating by mouth. She reports increased anxiety recently and states she has had similar GI symptoms with anxiety. She had a bowel movement this morning, but states that his been black for several days. She denies fevers, chest pain, palpitations, shortness of breath, coughing, wheezing. No abdominal distention, diarrhea, rectal bleeding, hematochezia. No urinary complaints, vaginal complaints. She does not take iron or Pepto-Bismol. She is not been sexually active for one year. She has a past medical history of GERD. Was seen here 9/24 for this was started on Zantac, Carafate and dexilant which she states that she quit taking because it made her feel better. She has a history of gastritis, anxiety, panic attacks. No history of GI bleed, NSAID use, EtOH use, peptic ulcer disease, MI, abdominal surgeries, gallbladder disease, pancreatitis, mesenteric ischemia, hypercoagulability, atrial fibrillation, arrhythmia. She is not a smoker. LMP: 10/8. Denies possibility of being pregnant. PND: Was Dr. Alwyn Ren, but states that he has retired and is looking for a new PMD.    Past Medical History:  Diagnosis Date  . Anxiety   . Bladder pain   . Dysuria   . GERD (gastroesophageal reflux disease)   . History of gastritis    mild per EGD 03-26-2015  . History of panic attacks   . History of pelvic inflammatory disease    PT reports this was misdiagnosed and should be removed.   . Major depressive disorder   . Meatal stenosis   . Urgency of urination   . UTI (lower urinary tract infection)     dx 11-08-2015  TREATED W/ KEFLEX  . Vitamin D deficiency   . Yeast infection    dx 11-08-2015  TREATED W/ DIFLUCAN    Past Surgical History:  Procedure Laterality Date  . CYSTOSCOPY WITH HYDRODISTENSION AND BIOPSY N/A 06/27/2015   Procedure: CYSTOSCOPY/BLADDER BIOPSY/HYDRODISTENSION AND  INSTILLATION OF MARCAINE AND PYRIDIUM;  Surgeon: Ihor Gully, MD;  Location: [facility] [place];  Service: Urology;  Laterality: N/A;  . CYSTOSCOPY WITH URETHRAL DILATATION N/A 11/14/2015   Procedure: CYSTOSCOPY WITH Balloon URETHRAL DILATATION;  Surgeon: Ihor Gully, MD;  [facility];  Service: Urology;  Laterality: N/A;  . ESOPHAGOGASTRODUODENOSCOPY  03-26-2015    Family History  Problem Relation Age of Onset  . Kidney disease Maternal Aunt   . Heart disease Maternal Aunt   . Gallbladder disease Maternal Aunt   . Colon cancer Neg Hx   . Esophageal cancer Neg Hx   . Rectal cancer Neg Hx   . Stomach cancer Neg Hx     Social History  Substance Use Topics  . Smoking status: Never Smoker  . Smokeless tobacco: Never Used  . Alcohol use No    No current facility-administered medications for this encounter.   Current Outpatient Prescriptions:  .  Dexlansoprazole 30 MG capsule, Take 1 capsule (30 mg total) by mouth daily., Disp: 30 capsule, Rfl: 0 .  hydrOXYzine (ATARAX/VISTARIL) 25 MG tablet, Take 1 tablet (25 mg total) by mouth every 6 (six) hours., Disp: 30 tablet, Rfl: 0 .  sucralfate (CARAFATE) 1 g tablet, Take 1  tablet (1 g total) by mouth 4 (four) times daily -  with meals and at bedtime., Disp: 120 tablet, Rfl: 0  Allergies  Allergen Reactions  . Macrobid [Nitrofurantoin [employer]Monohyd Macro] Hives  . Almond Oil Rash  . Doxycycline Nausea Only  . Peanuts [Peanut Oil] Rash     ROS  As noted in HPI.   Physical Exam  BP 108/71 (BP Location: Left Arm)   Pulse 79   Temp 97.9 F (36.6 C) (Oral)   Resp 16   LMP 08/01/2016 (Exact Date)   SpO2 100%    Constitutional: Well developed, well nourished, no acute distress Eyes:  EOMI, conjunctiva normal bilaterally HENT: Normocephalic, atraumatic,mucus membranes moist Respiratory: Normal inspiratory effort Lungs Clear bilaterally, good air movement Cardiovascular: Normal rate regular rhythm no murmurs rubs or gallops GI: Normal appearance. Flat, soft, mild epigastric tenderness. No guarding or rebound . Normal bowel sounds nondistended. Negative Murphy, negative McBurney. Back: No CVA tenderness skin: No rash, skin intact Musculoskeletal: no deformities Neurologic: Alert & oriented x 3, no focal neuro deficits Psychiatric: Speech and behavior appropriate   ED Course   Medications - No data to display  Orders Placed This Encounter  Procedures  . POCT urinalysis dip (device)    Standing Status:   Standing    Number of Occurrences:   1  . Pregnancy, urine POC    Standing Status:   Standing    Number of Occurrences:   1    Results for orders placed or performed during the hospital encounter of 08/22/16 (from the past 24 hour(s))  POCT urinalysis dip (device)     Status: Abnormal   Collection Time: 08/22/16  6:25 PM  Result Value Ref Range   Glucose, UA NEGATIVE NEGATIVE mg/dL   Bilirubin Urine NEGATIVE NEGATIVE   Ketones, ur 40 (A) NEGATIVE mg/dL   Specific Gravity, Urine 1.010 1.005 - 1.030   Hgb urine dipstick TRACE (A) NEGATIVE   pH 5.5 5.0 - 8.0   Protein, ur NEGATIVE NEGATIVE mg/dL   Urobilinogen, UA 0.2 0.0 - 1.0 mg/dL   Nitrite NEGATIVE NEGATIVE   Leukocytes, UA NEGATIVE NEGATIVE  Pregnancy, urine POC     Status: None   Collection Time: 08/22/16  6:31 PM  Result Value Ref Range   Preg Test, Ur NEGATIVE NEGATIVE   No results found.  ED Clinical Impression  Acute gastritis without hemorrhage, unspecified gastritis type  Epigastric pain  Anxiety   ED Assessment/Plan  No UTI. Trace hematuria. Positive ketones but this consistent with patient's decreased by  mouth intake. Presentation most consistent with a gastritis. . Does not appear to be pancreatitis, mesenrteric ischemia or other emergent cause of her sx at this time. We'll send home with Zantac 50 mg twice a day, refill her dexilant. We'll also start her on some Atarax for anxiety. Starting at 25 mg 4 times a day as patient weighs only 85 pounds. She may go up to 100 qid as needed. Will provide primary care referral as she states that her primary care physician has retired. She will need to follow-up with a primary care physician regarding the hematuria. Discussed labs,  MDM, plan and followup with patient. Discussed sn/sx that should prompt return to the ED. Patient agrees with plan.   Meds ordered this encounter  Medications  . Dexlansoprazole 30 MG capsule    Sig: Take 1 capsule (30 mg total) by mouth daily.    Dispense:  30 capsule    Refill:  0  .  hydrOXYzine (ATARAX/VISTARIL) 25 MG tablet    Sig: Take 1 tablet (25 mg total) by mouth every 6 (six) hours.    Dispense:  30 tablet    Refill:  0    *This clinic note was created using Scientist, clinical (histocompatibility and immunogenetics)Dragon dictation software. Therefore, there may be occasional mistakes despite careful proofreading.  ?   Domenick GongAshley Xandra Laramee, MD 08/23/16 [phone]

## 2016-08-22 NOTE — ED Triage Notes (Signed)
[facility] a [facility] of epigastric pain and nausea that started yesterday. The patient reported a hx of anxiety that used to produce the same symptoms. She stated that she used to take Xanax PRN for the symptoms but has been out of the medicine for 1 year.

## 2016-08-22 NOTE — Discharge Instructions (Signed)
You may take 50 to 100 mg of the hydroxyzine/Vistaril every 6 hours as needed for anxiety. Use the lowest effective dose. We're starting off at 25 mg.   Follow-up with a primary care physician or your choice. See list below.   Below is a list of primary care practices who are taking new patients for you to follow-up with.   [facility] [phone] [address] [place] [address]  [facility]: [address] [place] [id number]  [phone]  [facility]: [address] [place] [address]   [phone]  [facility]: [address] [place] [address]  [phone]  [facility] : [address] [place] [id number] [phone]  [facility]: [address] [place] [phone]  [facility]: [address]  [place] [phone]  Dr. Oneal GroutMahima Pandey [address] [facility] [place] [id number]  [phone]  Dr. Jackie PlumGeorge Osei-Bonsu, [facility]. [address] [place] [address]  [phone]

## 2016-08-23 ENCOUNTER — Encounter (HOSPITAL_COMMUNITY): Payer: Self-pay | Admitting: Family Medicine

## 2016-08-23 ENCOUNTER — Ambulatory Visit (HOSPITAL_COMMUNITY)
Admission: EM | Admit: 2016-08-23 | Discharge: 2016-08-23 | Disposition: A | Discharge status: Home / Self Care | Payer: No Typology Code available for payment source | Attending: Family Medicine | Admitting: Family Medicine

## 2016-08-23 DIAGNOSIS — Z87440 Personal history of urinary (tract) infections: Secondary | ICD-10-CM | POA: Insufficient documentation

## 2016-08-23 DIAGNOSIS — R109 Unspecified abdominal pain: Secondary | ICD-10-CM | POA: Diagnosis present

## 2016-08-23 DIAGNOSIS — R102 Pelvic and perineal pain: Secondary | ICD-10-CM | POA: Insufficient documentation

## 2016-08-23 LAB — POCT URINALYSIS DIP (DEVICE)
GLUCOSE, UA: NEGATIVE mg/dL
Hgb urine dipstick: NEGATIVE
Ketones, ur: 80 mg/dL — AB
LEUKOCYTES UA: NEGATIVE
Nitrite: NEGATIVE
PROTEIN: NEGATIVE mg/dL
Specific Gravity, Urine: >=1.03 (ref 1.005–1.030)
UROBILINOGEN UA: 0.2 mg/dL (ref 0.0–1.0)
pH: 6 (ref 5.0–8.0)

## 2016-08-23 LAB — OCCULT BLOOD, POC DEVICE: FECAL OCCULT BLD: NEGATIVE

## 2016-08-23 MED ORDER — FLUCONAZOLE 150 MG PO TABS: 150.0000 mg | ORAL_TABLET | Freq: Once | ORAL | 0 refills | Status: AC

## 2016-08-23 MED ORDER — CIPROFLOXACIN HCL 500 MG PO TABS: 500.0000 mg | ORAL_TABLET | Freq: Two times a day (BID) | ORAL | 0 refills | Status: DC

## 2016-08-23 NOTE — ED Triage Notes (Signed)
Patient states she was here yesterday and that there was blood in her urine, now she is having abdominal pain and is unable to completely empty her bladder.

## 2016-08-23 NOTE — Discharge Instructions (Signed)
Please follow-up with urgent medical and family care on [address] in one week

## 2016-08-23 NOTE — ED Provider Notes (Signed)
[facility]    CSN: [id number] Arrival date & time: 08/23/16  [id number]     History   Chief Complaint Chief Complaint  Patient presents with  . Abdominal Pain    HPI Dawn Price is a 30 y.o. female.   This is a 30 year old woman who presents with symptoms of urinary tract infection. She was seen with dysuria back in August, and the urine culture was negative.  Patient has discomfort in the suprapubic area bilaterally and she is going to bathroom frequently but the sensation that she's incompletely emptying. The symptoms began yesterday.  Patient has had no fever or back pain.      Past Medical History:  Diagnosis Date  . Anxiety   . Bladder pain   . Dysuria   . GERD (gastroesophageal reflux disease)   . History of gastritis    mild per EGD 03-26-2015  . History of panic attacks   . History of pelvic inflammatory disease    PT reports this was misdiagnosed and should be removed.   . Major depressive disorder   . Meatal stenosis   . Urgency of urination   . UTI (lower urinary tract infection)    dx 11-08-2015  TREATED W/ KEFLEX  . Vitamin D deficiency   . Yeast infection    dx 11-08-2015  TREATED W/ DIFLUCAN    Patient Active Problem List   Diagnosis Date Noted  . History of gastritis 09/17/2015  . GERD (gastroesophageal reflux disease) 09/17/2015  . Pelvic pain in female 09/17/2015  . Loss of weight 09/17/2015  . Major depressive disorder 06/26/2014    Past Surgical History:  Procedure Laterality Date  . CYSTOSCOPY WITH HYDRODISTENSION AND BIOPSY N/A 06/27/2015   Procedure: CYSTOSCOPY/BLADDER BIOPSY/HYDRODISTENSION AND  INSTILLATION OF MARCAINE AND PYRIDIUM;  Surgeon: Ihor GullyMark Ottelin, MD;  [facility] [place];  Service: Urology;  Laterality: N/A;  . CYSTOSCOPY WITH URETHRAL DILATATION N/A 11/14/2015   Procedure: CYSTOSCOPY WITH Balloon URETHRAL DILATATION;  Surgeon: Ihor GullyMark Ottelin, MD;  [facility] [place];  Service:  Urology;  Laterality: N/A;  . ESOPHAGOGASTRODUODENOSCOPY  03-26-2015    OB History    Gravida Para Term Preterm AB Living   1             SAB TAB Ectopic Multiple Live Births                   Home Medications    Prior to Admission medications   Medication Sig Start Date End Date Taking? Authorizing Provider  ciprofloxacin (CIPRO) 500 MG tablet Take 1 tablet (500 mg total) by mouth 2 (two) times daily. 08/23/16   Elvina SidleKurt Parks Czajkowski, MD  Dexlansoprazole 30 MG capsule Take 1 capsule (30 mg total) by mouth daily. 08/22/16   Domenick GongAshley Mortenson, MD  fluconazole (DIFLUCAN) 150 MG tablet Take 1 tablet (150 mg total) by mouth once. Repeat if needed 08/23/16 08/23/16  Elvina SidleKurt Berania Peedin, MD  hydrOXYzine (ATARAX/VISTARIL) 25 MG tablet Take 1 tablet (25 mg total) by mouth every 6 (six) hours. 08/22/16   Domenick GongAshley Mortenson, MD  sucralfate (CARAFATE) 1 g tablet Take 1 tablet (1 g total) by mouth 4 (four) times daily -  with meals and at bedtime. 07/18/16   Hayden Rasmussenavid Mabe, NP    Family History Family History  Problem Relation Age of Onset  . Kidney disease Maternal Aunt   . Heart disease Maternal Aunt   . Gallbladder disease Maternal Aunt   . Colon cancer Neg Hx   .  Esophageal cancer Neg Hx   . Rectal cancer Neg Hx   . Stomach cancer Neg Hx     Social History Social History  Substance Use Topics  . Smoking status: Never Smoker  . Smokeless tobacco: Never Used  . Alcohol use No     Allergies   Macrobid [nitrofurantoin monohyd macro]; Almond oil; Doxycycline; and Peanuts [peanut oil]   Review of Systems Review of Systems  Constitutional: Negative.   HENT: Negative.   Eyes: Negative.   Respiratory: Negative.   Cardiovascular: Negative.   Gastrointestinal: Negative for abdominal pain.  Endocrine: Negative.   Genitourinary: Positive for dysuria, frequency, pelvic pain and urgency. Negative for vaginal pain.  Musculoskeletal: Negative.      Physical Exam Triage Vital Signs ED Triage  Vitals [08/23/16 1949]  Enc Vitals Group     BP      Pulse      Resp      Temp      Temp src      SpO2      Weight      Height      Head Circumference      Peak Flow      Pain Score 8     Pain Loc      Pain Edu?      Excl. in GC?    No data found.   Updated Vital Signs BP 118/74 (BP Location: Left Arm)   Pulse 94   Temp 97.8 F (36.6 C) (Oral)   Resp 16   LMP 08/01/2016 (Exact Date)   SpO2 99%    Physical Exam  Constitutional: She is oriented to person, place, and time. She appears well-developed and well-nourished.  HENT:  Head: Normocephalic.  Right Ear: External ear normal.  Left Ear: External ear normal.  Mouth/Throat: Oropharynx is clear and moist.  Eyes: Conjunctivae and EOM are normal. Pupils are equal, round, and reactive to light.  Neck: Normal range of motion. Neck supple.  Pulmonary/Chest: Effort normal.  Musculoskeletal: Normal range of motion.  Neurological: She is alert and oriented to person, place, and time.  Skin: Skin is warm and dry.  Nursing note and vitals reviewed.    UC Treatments / Results  Labs (all labs ordered are listed, but only abnormal results are displayed) Labs Reviewed  POCT URINALYSIS DIP (DEVICE) - Abnormal; Notable for the following:       Result Value   Bilirubin Urine SMALL (*)    Ketones, ur 80 (*)    All other components within normal limits  URINE CULTURE    EKG  EKG Interpretation None       Radiology No results found.  Procedures Procedures (including critical care time)  Medications Ordered in UC Medications - No data to display   Initial Impression / Assessment and Plan / UC Course  I have reviewed the triage vital signs and the nursing notes.  Pertinent labs & imaging results that were available during my care of the patient were reviewed by me and considered in my medical decision making (see chart for details).  Clinical Course    Final Clinical Impressions(s) / UC Diagnoses   Final  diagnoses:  Pelvic pain in female    New Prescriptions New Prescriptions   CIPROFLOXACIN (CIPRO) 500 MG TABLET    Take 1 tablet (500 mg total) by mouth 2 (two) times daily.   FLUCONAZOLE (DIFLUCAN) 150 MG TABLET    Take 1 tablet (150 mg total) by mouth  once. Repeat if needed     Elvina Sidle, MD 08/23/16 2006

## 2016-08-25 LAB — URINE CULTURE: Culture: NO GROWTH

## 2016-08-29 ENCOUNTER — Ambulatory Visit (HOSPITAL_COMMUNITY)
Admission: EM | Admit: 2016-08-29 | Discharge: 2016-08-29 | Disposition: A | Discharge status: Home / Self Care | Payer: No Typology Code available for payment source | Attending: Emergency Medicine | Admitting: Emergency Medicine

## 2016-08-29 ENCOUNTER — Encounter (HOSPITAL_COMMUNITY): Payer: Self-pay

## 2016-08-29 DIAGNOSIS — N3090 Cystitis, unspecified without hematuria: Secondary | ICD-10-CM | POA: Diagnosis not present

## 2016-08-29 LAB — POCT PREGNANCY, URINE: PREG TEST UR: NEGATIVE

## 2016-08-29 NOTE — Discharge Instructions (Signed)
There is no sign of infection in your urine. Her pain and discomfort is coming from chronic cystitis. It is okay to use the prelief regularly. I recommend using it twice a day for the next 3-4 days. If things are not improving over the next several days to a week, please follow-up.

## 2016-08-29 NOTE — ED Provider Notes (Signed)
[facility]    CSN: [id number] Arrival date & time: 08/29/16  1316     History   Chief Complaint Chief Complaint  Patient presents with  . Urinary Tract Infection    HPI Dawn Price is a 30 y.o. female.   HPI She is a 30 year old woman here for evaluation of bladder pain. She reports a 2 day history of lower abdominal cramping, bloated sensation, and burning with urination. She has a history of interstitial cystitis as well as infections. She states she did eat some food 2 days ago that often flares her cystitis. No fevers. She is taking over-the-counter prelief as needed.  Past Medical History:  Diagnosis Date  . Anxiety   . Bladder pain   . Dysuria   . GERD (gastroesophageal reflux disease)   . History of gastritis    mild per EGD 03-26-2015  . History of panic attacks   . History of pelvic inflammatory disease    PT reports this was misdiagnosed and should be removed.   . Major depressive disorder   . Meatal stenosis   . Urgency of urination   . UTI (lower urinary tract infection)    dx 11-08-2015  TREATED W/ KEFLEX  . Vitamin D deficiency   . Yeast infection    dx 11-08-2015  TREATED W/ DIFLUCAN    Patient Active Problem List   Diagnosis Date Noted  . History of gastritis 09/17/2015  . GERD (gastroesophageal reflux disease) 09/17/2015  . Pelvic pain in female 09/17/2015  . Loss of weight 09/17/2015  . Major depressive disorder 06/26/2014    Past Surgical History:  Procedure Laterality Date  . CYSTOSCOPY WITH HYDRODISTENSION AND BIOPSY N/A 06/27/2015   Procedure: CYSTOSCOPY/BLADDER BIOPSY/HYDRODISTENSION AND  INSTILLATION OF MARCAINE AND PYRIDIUM;  Surgeon: Ihor GullyMark Ottelin, MD;  [facility] [place];  Service: Urology;  Laterality: N/A;  . CYSTOSCOPY WITH URETHRAL DILATATION N/A 11/14/2015   Procedure: CYSTOSCOPY WITH Balloon URETHRAL DILATATION;  Surgeon: Ihor GullyMark Ottelin, MD;  [facility] [place];  Service: Urology;   Laterality: N/A;  . ESOPHAGOGASTRODUODENOSCOPY  03-26-2015    OB History    Gravida Para Term Preterm AB Living   1             SAB TAB Ectopic Multiple Live Births                   Home Medications    Prior to Admission medications   Medication Sig Start Date End Date Taking? Authorizing Provider  Dexlansoprazole 30 MG capsule Take 1 capsule (30 mg total) by mouth daily. 08/22/16   Domenick GongAshley Mortenson, MD  hydrOXYzine (ATARAX/VISTARIL) 25 MG tablet Take 1 tablet (25 mg total) by mouth every 6 (six) hours. 08/22/16   Domenick GongAshley Mortenson, MD  sucralfate (CARAFATE) 1 g tablet Take 1 tablet (1 g total) by mouth 4 (four) times daily -  with meals and at bedtime. 07/18/16   Hayden Rasmussenavid Mabe, NP    Family History Family History  Problem Relation Age of Onset  . Kidney disease Maternal Aunt   . Heart disease Maternal Aunt   . Gallbladder disease Maternal Aunt   . Colon cancer Neg Hx   . Esophageal cancer Neg Hx   . Rectal cancer Neg Hx   . Stomach cancer Neg Hx     Social History Social History  Substance Use Topics  . Smoking status: Never Smoker  . Smokeless tobacco: Never Used  . Alcohol use No  Allergies   Macrobid [nitrofurantoin monohyd macro]; Almond oil; Doxycycline; and Peanuts [peanut oil]   Review of Systems Review of Systems As in history of present illness  Physical Exam Triage Vital Signs ED Triage Vitals  Enc Vitals Group     BP 08/29/16 1416 98/65     Pulse Rate 08/29/16 1416 85     Resp 08/29/16 1416 16     Temp 08/29/16 1416 99.1 F (37.3 C)     Temp Source 08/29/16 1416 Oral     SpO2 08/29/16 1416 100 %     Weight --      Height --      Head Circumference --      Peak Flow --      Pain Score 08/29/16 1431 8     Pain Loc --      Pain Edu? --      Excl. in GC? --    No data found.   Updated Vital Signs BP 98/65 (BP Location: Left Arm)   Pulse 85   Temp 99.1 F (37.3 C) (Oral)   Resp 16   LMP 08/01/2016 (Exact Date)   SpO2 100%    Visual Acuity Right Eye Distance:   Left Eye Distance:   Bilateral Distance:    Right Eye Near:   Left Eye Near:    Bilateral Near:     Physical Exam  Constitutional: She is oriented to person, place, and time. She appears well-developed and well-nourished. No distress.  Cardiovascular: Normal rate.   Pulmonary/Chest: Effort normal.  Abdominal: Soft. Bowel sounds are normal. She exhibits no distension. There is tenderness (mild in suprapubic). There is no rebound and no guarding.  Neurological: She is alert and oriented to person, place, and time.     UC Treatments / Results  Labs (all labs ordered are listed, but only abnormal results are displayed) Labs Reviewed  POCT PREGNANCY, URINE  UA positive for ketones only.  EKG  EKG Interpretation None       Radiology No results found.  Procedures Procedures (including critical care time)  Medications Ordered in UC Medications - No data to display   Initial Impression / Assessment and Plan / UC Course  I have reviewed the triage vital signs and the nursing notes.  Pertinent labs & imaging results that were available during my care of the patient were reviewed by me and considered in my medical decision making (see chart for details).  Clinical Course     No sign of infection on UA. Discussed that she can take the prelief twice a day for the next several days. Follow up as needed.  Final Clinical Impressions(s) / UC Diagnoses   Final diagnoses:  Cystitis    New Prescriptions Discharge Medication List as of 08/29/2016  3:09 PM       Charm RingsErin J Jannine Abreu, MD 08/29/16 [phone]

## 2016-08-29 NOTE — ED Triage Notes (Signed)
Started two days ago having abdominal cramping, not  emptying bladder completely, dysuria, bloating and also having chills. Has not tried any OTC medication. Does has a hx of cystitis. Was here last week got a rx for cipro but stopped taking it after 3 days. Said she stopped taking it because it wasn't helping and that her symptoms wasn't that severe.

## 2016-08-29 NOTE — ED Notes (Signed)
Patients urinalysis results not crossing over due to time change that occurred.... Results are: GLU-Neg, BIL-Neg, KET-Trace, SG-1.025, BLO-Neg, pH-6.5, PRO-Neg, URO- 0.2, NIT- Neg, LEU- Neg. Results were printed and shown to provider.

## 2016-08-30 LAB — POCT URINALYSIS DIP (DEVICE)
Bilirubin Urine: NEGATIVE
GLUCOSE, UA: NEGATIVE mg/dL
Hgb urine dipstick: NEGATIVE
Leukocytes, UA: NEGATIVE
Nitrite: NEGATIVE
PH: 6.5 (ref 5.0–8.0)
PROTEIN: NEGATIVE mg/dL
Specific Gravity, Urine: 1.025 (ref 1.005–1.030)
UROBILINOGEN UA: 0.2 mg/dL (ref 0.0–1.0)

## 2016-09-27 ENCOUNTER — Ambulatory Visit: Payer: Self-pay | Admitting: Gastroenterology

## 2017-03-26 ENCOUNTER — Ambulatory Visit (HOSPITAL_COMMUNITY): Admission: EM | Admit: 2017-03-26 | Discharge: 2017-03-26 | Discharge status: Absent without leave | Payer: Self-pay

## 2017-03-27 ENCOUNTER — Encounter (HOSPITAL_COMMUNITY): Payer: Self-pay | Admitting: Oncology

## 2017-03-27 ENCOUNTER — Emergency Department (HOSPITAL_COMMUNITY)
Admission: EM | Admit: 2017-03-27 | Discharge: 2017-03-27 | Disposition: A | Discharge status: Home / Self Care | Payer: No Typology Code available for payment source | Attending: Emergency Medicine | Admitting: Emergency Medicine

## 2017-03-27 DIAGNOSIS — Z91010 Allergy to peanuts: Secondary | ICD-10-CM | POA: Insufficient documentation

## 2017-03-27 DIAGNOSIS — R42 Dizziness and giddiness: Secondary | ICD-10-CM | POA: Insufficient documentation

## 2017-03-27 LAB — CBC
HCT: 36.3 % (ref 36.0–46.0)
HEMOGLOBIN: 12.3 g/dL (ref 12.0–15.0)
MCH: 30 pg (ref 26.0–34.0)
MCHC: 33.9 g/dL (ref 30.0–36.0)
MCV: 88.5 fL (ref 78.0–100.0)
Platelets: 246 10*3/uL (ref 150–400)
RBC: 4.1 MIL/uL (ref 3.87–5.11)
RDW: 12.2 % (ref 11.5–15.5)
WBC: 5.8 10*3/uL (ref 4.0–10.5)

## 2017-03-27 LAB — BASIC METABOLIC PANEL
Anion gap: 5 (ref 5–15)
BUN: 14 mg/dL (ref 6–20)
CHLORIDE: 109 mmol/L (ref 101–111)
CO2: 28 mmol/L (ref 22–32)
CREATININE: 0.57 mg/dL (ref 0.44–1.00)
Calcium: 9.4 mg/dL (ref 8.9–10.3)
GFR calc Af Amer: >60 mL/min (ref >60)
GFR calc non Af Amer: >60 mL/min (ref >60)
Glucose, Bld: 97 mg/dL (ref 65–99)
Potassium: 3.9 mmol/L (ref 3.5–5.1)
Sodium: 142 mmol/L (ref 135–145)

## 2017-03-27 LAB — URINALYSIS, ROUTINE W REFLEX MICROSCOPIC
BILIRUBIN URINE: NEGATIVE
Glucose, UA: NEGATIVE mg/dL
Hgb urine dipstick: NEGATIVE
KETONES UR: NEGATIVE mg/dL
LEUKOCYTES UA: NEGATIVE
NITRITE: NEGATIVE
Protein, ur: NEGATIVE mg/dL
SPECIFIC GRAVITY, URINE: 1.014 (ref 1.005–1.030)
pH: 5 (ref 5.0–8.0)

## 2017-03-27 LAB — CBG MONITORING, ED: GLUCOSE-CAPILLARY: 91 mg/dL (ref 65–99)

## 2017-03-27 NOTE — ED Provider Notes (Signed)
[facility]   CSN: [id number] Arrival date & time: 03/27/17  0037     History   Chief Complaint Chief Complaint  Patient presents with  . Dizziness    HPI Dawn Price is a 31 y.o. female.  She complains of dizziness, worsened by taking a laxative, 2 weeks ago.  Dizziness is intermittent, and occurs both with standing and while supine.  Tonight she became worried so came here for evaluation.  She is currently seeing a GI doctor with plans for upper endoscopy in 1 month, to evaluate dyspepsia.  She has been intolerant of multiple PPI and gastric acid blocker medications.  She denies fever, chills, cough, chest pain, focal weakness or paresthesia.  There are no other known modifying factors.  HPI  Past Medical History:  Diagnosis Date  . Anxiety   . Bladder pain   . Dysuria   . GERD (gastroesophageal reflux disease)   . History of gastritis    mild per EGD 03-26-2015  . History of panic attacks   . History of pelvic inflammatory disease    PT reports this was misdiagnosed and should be removed.   . Major depressive disorder   . Meatal stenosis   . Urgency of urination   . UTI (lower urinary tract infection)    dx 11-08-2015  TREATED W/ KEFLEX  . Vitamin D deficiency   . Yeast infection    dx 11-08-2015  TREATED W/ DIFLUCAN    Patient Active Problem List   Diagnosis Date Noted  . History of gastritis 09/17/2015  . GERD (gastroesophageal reflux disease) 09/17/2015  . Pelvic pain in female 09/17/2015  . Loss of weight 09/17/2015  . Major depressive disorder 06/26/2014    Past Surgical History:  Procedure Laterality Date  . CYSTOSCOPY WITH HYDRODISTENSION AND BIOPSY N/A 06/27/2015   Procedure: CYSTOSCOPY/BLADDER BIOPSY/HYDRODISTENSION AND  INSTILLATION OF MARCAINE AND PYRIDIUM;  Surgeon: Ihor Gully, MD;  [facility];  Service: Urology;  Laterality: N/A;  . CYSTOSCOPY WITH URETHRAL DILATATION N/A 11/14/2015   Procedure: CYSTOSCOPY WITH Balloon URETHRAL DILATATION;  Surgeon: Ihor Gully, MD;  Location: [facility];  Service: Urology;  Laterality: N/A;  . ESOPHAGOGASTRODUODENOSCOPY  03-26-2015    OB History    Gravida Para Term Preterm AB Living   1             SAB TAB Ectopic Multiple Live Births                   Home Medications    Prior to Admission medications   Medication Sig Start Date End Date Taking? Authorizing Provider  Dexlansoprazole 30 MG capsule Take 1 capsule (30 mg total) by mouth daily. Patient not taking: Reported on 03/27/2017 08/22/16   Domenick Gong, MD  hydrOXYzine (ATARAX/VISTARIL) 25 MG tablet Take 1 tablet (25 mg total) by mouth every 6 (six) hours. Patient not taking: Reported on 03/27/2017 08/22/16   Domenick Gong, MD  sucralfate (CARAFATE) 1 g tablet Take 1 tablet (1 g total) by mouth 4 (four) times daily -  with meals and at bedtime. Patient not taking: Reported on 03/27/2017 07/18/16   Hayden Rasmussen, NP    Family History Family History  Problem Relation Age of Onset  . Kidney disease Maternal Aunt   . Heart disease Maternal Aunt   . Gallbladder disease Maternal Aunt   . Colon cancer Neg Hx   . Esophageal cancer Neg Hx   . Rectal cancer Neg  Hx   . Stomach cancer Neg Hx     Social History Social History  Substance Use Topics  . Smoking status: Never Smoker  . Smokeless tobacco: Never Used  . Alcohol use No     Allergies   Macrobid [nitrofurantoin monohyd macro]; Almond oil; Doxycycline; and Peanuts [peanut oil]   Review of Systems Review of Systems  All other systems reviewed and are negative.    Physical Exam Updated Vital Signs BP (!) 106/58 (BP Location: Left Arm)   Pulse 77   Temp 97.9 F (36.6 C) (Oral)   Resp 18   Ht 5' (1.524 m)   Wt 39 kg (86 lb)   LMP 02/27/2017 (Exact Date)   SpO2 100%   BMI 16.80 kg/m   Physical Exam  Constitutional: She is oriented to person, place, and time. She appears  well-developed and well-nourished. No distress.  HENT:  Head: Normocephalic and atraumatic.  Eyes: Conjunctivae and EOM are normal. Pupils are equal, round, and reactive to light.  Neck: Normal range of motion and phonation normal. Neck supple.  Cardiovascular: Normal rate and regular rhythm.   Pulmonary/Chest: Effort normal and breath sounds normal. She exhibits no tenderness.  Abdominal: Soft. She exhibits no distension. There is no tenderness. There is no guarding.  Musculoskeletal: Normal range of motion.  Neurological: She is alert and oriented to person, place, and time. She exhibits normal muscle tone.  No dysarthria, or aphasia.  Skin: Skin is warm and dry.  Psychiatric: She has a normal mood and affect. Her behavior is normal. Judgment and thought content normal.  Nursing note and vitals reviewed.    ED Treatments / Results  Labs (all labs ordered are listed, but only abnormal results are displayed) Labs Reviewed  BASIC METABOLIC PANEL  CBC  URINALYSIS, ROUTINE W REFLEX MICROSCOPIC  CBG MONITORING, ED    EKG  EKG Interpretation None       Radiology No results found.  Procedures Procedures (including critical care time)  Medications Ordered in ED Medications - No data to display   Initial Impression / Assessment and Plan / ED Course  I have reviewed the triage vital signs and the nursing notes.  Pertinent labs & imaging results that were available during my care of the patient were reviewed by me and considered in my medical decision making (see chart for details).      Patient Vitals for the past 24 hrs:  BP Temp Temp src Pulse Resp SpO2 Height Weight  03/27/17 0049 - - - - - - 5' (1.524 m) 39 kg (86 lb)  03/27/17 0048 (!) 106/58 97.9 F (36.6 C) Oral 77 18 100 % - -    5:05 AM Reevaluation with update and discussion. After initial assessment and treatment, an updated evaluation reveals no change in clinical status.  Findings discussed with the  patient and all questions were answered. Taneshia Lorence L    Final Clinical Impressions(s) / ED Diagnoses   Final diagnoses:  Dizziness   Nonspecific dizziness.  Altered appetite recently, without signs for significant metabolic or blood volume abnormalities.  Possible mild dehydration.  Also likely element of anxiety.  Nursing Notes Reviewed/ Care Coordinated Applicable Imaging Reviewed Interpretation of Laboratory Data incorporated into ED treatment  The patient appears reasonably screened and/or stabilized for discharge and I doubt any other medical condition or other [facility] requiring further screening, evaluation, or treatment in the ED at this time prior to discharge.  Plan: Home Medications-over-the-counter antacid of choice; Home  Treatments-rest, fluids, 3 meals a day; return here if the recommended treatment, does not improve the symptoms; Recommended follow up-GI follow-up as scheduled and as needed    New Prescriptions New Prescriptions   No medications on file     Mancel Bale, MD 03/27/17 [phone]

## 2017-03-27 NOTE — ED Triage Notes (Addendum)
Pt states that she took a laxative approximately 2 weeks ago since that time she has had intermittent dizziness.  Pt reports that the dizziness got worse today.  Pt also c/o intermittent numbness to the left side of her head.  Pt ambulatory w/ a steady gait from lobby to triage.

## 2017-03-27 NOTE — Discharge Instructions (Signed)
Testing today, was reassuring.  You may be slightly dehydrated.  Try an antacid such as Maalox, liquid, or Tums, tablets, to help your dyspepsia.  Try to eat 3 meals each day.  Follow-up with Dr. Loreta AveMann, as scheduled, and as needed.

## 2017-04-24 ENCOUNTER — Encounter (HOSPITAL_COMMUNITY): Payer: Self-pay | Admitting: Emergency Medicine

## 2017-04-24 ENCOUNTER — Emergency Department (HOSPITAL_COMMUNITY)
Admission: EM | Admit: 2017-04-24 | Discharge: 2017-04-24 | Disposition: A | Discharge status: Home / Self Care | Payer: No Typology Code available for payment source | Attending: Emergency Medicine | Admitting: Emergency Medicine

## 2017-04-24 DIAGNOSIS — R110 Nausea: Secondary | ICD-10-CM | POA: Insufficient documentation

## 2017-04-24 DIAGNOSIS — R42 Dizziness and giddiness: Secondary | ICD-10-CM | POA: Insufficient documentation

## 2017-04-24 DIAGNOSIS — Z8719 Personal history of other diseases of the digestive system: Secondary | ICD-10-CM | POA: Diagnosis not present

## 2017-04-24 DIAGNOSIS — R1084 Generalized abdominal pain: Secondary | ICD-10-CM | POA: Insufficient documentation

## 2017-04-24 DIAGNOSIS — D649 Anemia, unspecified: Secondary | ICD-10-CM | POA: Diagnosis not present

## 2017-04-24 DIAGNOSIS — R55 Syncope and collapse: Secondary | ICD-10-CM | POA: Diagnosis present

## 2017-04-24 DIAGNOSIS — E860 Dehydration: Secondary | ICD-10-CM | POA: Diagnosis not present

## 2017-04-24 LAB — COMPREHENSIVE METABOLIC PANEL
ALBUMIN: 3.6 g/dL (ref 3.5–5.0)
ALT: 23 U/L (ref 14–54)
AST: 26 U/L (ref 15–41)
Alkaline Phosphatase: 48 U/L (ref 38–126)
Anion gap: 4 — ABNORMAL LOW (ref 5–15)
BUN: 18 mg/dL (ref 6–20)
CHLORIDE: 114 mmol/L — AB (ref 101–111)
CO2: 23 mmol/L (ref 22–32)
Calcium: 8.4 mg/dL — ABNORMAL LOW (ref 8.9–10.3)
Creatinine, Ser: 0.65 mg/dL (ref 0.44–1.00)
GFR calc Af Amer: >60 mL/min (ref >60)
GFR calc non Af Amer: >60 mL/min (ref >60)
GLUCOSE: 92 mg/dL (ref 65–99)
POTASSIUM: 3.6 mmol/L (ref 3.5–5.1)
Sodium: 141 mmol/L (ref 135–145)
Total Bilirubin: 0.8 mg/dL (ref 0.3–1.2)
Total Protein: 6.3 g/dL — ABNORMAL LOW (ref 6.5–8.1)

## 2017-04-24 LAB — URINALYSIS, ROUTINE W REFLEX MICROSCOPIC
Bilirubin Urine: NEGATIVE
GLUCOSE, UA: NEGATIVE mg/dL
Hgb urine dipstick: NEGATIVE
Ketones, ur: NEGATIVE mg/dL
LEUKOCYTES UA: NEGATIVE
Nitrite: NEGATIVE
PH: 7 (ref 5.0–8.0)
Protein, ur: NEGATIVE mg/dL
SPECIFIC GRAVITY, URINE: 1.019 (ref 1.005–1.030)

## 2017-04-24 LAB — I-STAT BETA HCG BLOOD, ED (MC, WL, AP ONLY): I-stat hCG, quantitative: <5 m[IU]/mL (ref <5)

## 2017-04-24 LAB — CBC
HEMATOCRIT: 32.6 % — AB (ref 36.0–46.0)
Hemoglobin: 11.4 g/dL — ABNORMAL LOW (ref 12.0–15.0)
MCH: 30.2 pg (ref 26.0–34.0)
MCHC: 35 g/dL (ref 30.0–36.0)
MCV: 86.2 fL (ref 78.0–100.0)
PLATELETS: 201 10*3/uL (ref 150–400)
RBC: 3.78 MIL/uL — AB (ref 3.87–5.11)
RDW: 12 % (ref 11.5–15.5)
WBC: 4.7 10*3/uL (ref 4.0–10.5)

## 2017-04-24 LAB — LIPASE, BLOOD: Lipase: 25 U/L (ref 11–51)

## 2017-04-24 MED ORDER — SODIUM CHLORIDE 0.9 % IV SOLN: INTRAVENOUS | Status: DC

## 2017-04-24 MED ORDER — SODIUM CHLORIDE 0.9 % IV BOLUS (SEPSIS)
1000.0000 mL | Freq: Once | INTRAVENOUS | Status: AC
  Administered 2017-04-24: 1000 mL via INTRAVENOUS

## 2017-04-24 MED ORDER — GI COCKTAIL ~~LOC~~
30.0000 mL | Freq: Once | ORAL | Status: AC
  Administered 2017-04-24: 30 mL via ORAL
  Filled 2017-04-24: qty 30

## 2017-04-24 MED ORDER — ONDANSETRON 8 MG PO TBDP: 8.0000 mg | ORAL_TABLET | Freq: Three times a day (TID) | ORAL | 0 refills | Status: AC | PRN

## 2017-04-24 MED ORDER — MECLIZINE HCL 25 MG PO TABS
25.0000 mg | ORAL_TABLET | Freq: Once | ORAL | Status: AC
  Administered 2017-04-24: 25 mg via ORAL
  Filled 2017-04-24: qty 1

## 2017-04-24 NOTE — ED Notes (Signed)
ED Provider at bedside. 

## 2017-04-24 NOTE — Discharge Instructions (Signed)
Drink plenty fluids, take the medications as needed for nausea, follow up with her primary care doctor next week, return as needed for worsening symptoms headache, fever

## 2017-04-24 NOTE — ED Notes (Signed)
Patient states that she cant give urine sample at this time. Will call out when ready

## 2017-04-24 NOTE — ED Triage Notes (Addendum)
Pt reports dizziness for the past 3 weeks. Was seen in the ED for this at that time. Also saw PCP for same.  Pt complains of worsening dizziness this am. Pt also complains of nausea and gastritis. 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  EMS gave 250ml NS prior to arrival which improved symptoms Pt also complains of her head feeling "numb" for the past 3 weeks.

## 2017-04-24 NOTE — ED Provider Notes (Signed)
[facility] Note   CSN: [id number] Arrival date & time: 04/24/17  0855     History   Chief Complaint Chief Complaint  Patient presents with  . Dizziness  . Nausea    HPI Dawn Price is a 31 y.o. female.  HPI Patient presented to the emergency room for lightheadedness and syncope today. Patient has been having difficulties with nausea and abdominal discomfort diagnosed as gastritis for the last several weeks. Patient has a history of gastritis confirmed by endoscopy back in June 2016. Over the last several weeks she's had burning discomfort in her abdomen. She has constant nausea. She has had intermittent vomiting but no vomiting over the last couple of days. This morning when she woke up she felt lightheaded and dizzy and had a brief fainting spell. Her symptoms are worse when she tries to stand. She denies any vertigo. Her head feels funny but it is not painful.  No injury with her fall.  No speech issues.  No neck pain. She denies any focal numbness or weakness. She does have a constant burning in her upper abdomen. Patient was seen in the emergency room a few weeks ago for similar symptoms .  She has seen a gastroenterologist and is scheduled for endoscopy in August.  Patient was brought into the emergency room by an ambulance. She was given IV fluids by EMS. Patient states she is feeling better after hydration. Past Medical History:  Diagnosis Date  . Anxiety   . Bladder pain   . Dysuria   . GERD (gastroesophageal reflux disease)   . History of gastritis    mild per EGD 03-26-2015  . History of panic attacks   . History of pelvic inflammatory disease    PT reports this was misdiagnosed and should be removed.   . Major depressive disorder   . Meatal stenosis   . Urgency of urination   . UTI (lower urinary tract infection)    dx 11-08-2015  TREATED W/ KEFLEX  . Vitamin D deficiency   . Yeast infection    dx 11-08-2015  TREATED W/ DIFLUCAN    Patient  Active Problem List   Diagnosis Date Noted  . History of gastritis 09/17/2015  . GERD (gastroesophageal reflux disease) 09/17/2015  . Pelvic pain in female 09/17/2015  . Loss of weight 09/17/2015  . Major depressive disorder 06/26/2014    Past Surgical History:  Procedure Laterality Date  . CYSTOSCOPY WITH HYDRODISTENSION AND BIOPSY N/A 06/27/2015   Procedure: CYSTOSCOPY/BLADDER BIOPSY/HYDRODISTENSION AND  INSTILLATION OF MARCAINE AND PYRIDIUM;  Surgeon: Ihor GullyMark Ottelin, MD;  [facility] [place];  Service: Urology;  Laterality: N/A;  . CYSTOSCOPY WITH URETHRAL DILATATION N/A 11/14/2015   Procedure: CYSTOSCOPY WITH Balloon URETHRAL DILATATION;  Surgeon: Ihor GullyMark Ottelin, MD;  [facility] [place];  Service: Urology;  Laterality: N/A;  . ESOPHAGOGASTRODUODENOSCOPY  03-26-2015    OB History    Gravida Para Term Preterm AB Living   1             SAB TAB Ectopic Multiple Live Births                   Home Medications    Prior to Admission medications   Medication Sig Start Date End Date Taking? Authorizing Provider  BIOTIN 5000 PO Take 5,000 mcg by mouth daily.   Yes [provider]  ibuprofen (ADVIL,MOTRIN) 200 MG tablet Take 200 mg by mouth every 6 (six) hours as needed.  Yes [provider]  Multiple Vitamin (MULTIVITAMIN) tablet Take 1 tablet by mouth daily.   Yes [provider]  omeprazole (PRILOSEC) 20 MG capsule Take 20 mg by mouth daily.   Yes [provider]  ondansetron (ZOFRAN ODT) 8 MG disintegrating tablet Take 1 tablet (8 mg total) by mouth every 8 (eight) hours as needed for nausea or vomiting. 04/24/17   Linwood Dibbles, MD    Family History Family History  Problem Relation Age of Onset  . Kidney disease Maternal Aunt   . Heart disease Maternal Aunt   . Gallbladder disease Maternal Aunt   . Colon cancer Neg Hx   . Esophageal cancer Neg Hx   . Rectal cancer Neg Hx   . Stomach cancer Neg Hx     Social  History Social History  Substance Use Topics  . Smoking status: Never Smoker  . Smokeless tobacco: Never Used  . Alcohol use No     Allergies   Macrobid [nitrofurantoin monohyd macro]; Almond oil; Doxycycline; and Peanuts [peanut oil]   Review of Systems Review of Systems  All other systems reviewed and are negative.    Physical Exam Updated Vital Signs BP 122/86   Pulse 85   Temp 97.5 F (36.4 C) (Oral)   Resp 14   SpO2 100%   Physical Exam  Constitutional: No distress.  Thin, underweight  HENT:  Head: Normocephalic and atraumatic.  Right Ear: External ear normal.  Left Ear: External ear normal.  Eyes: Conjunctivae are normal. Right eye exhibits no discharge. Left eye exhibits no discharge. No scleral icterus.  Neck: Neck supple. No tracheal deviation present.  Cardiovascular: Normal rate, regular rhythm and intact distal pulses.   Pulmonary/Chest: Effort normal and breath sounds normal. No stridor. No respiratory distress. She has no wheezes. She has no rales.  Abdominal: Soft. Bowel sounds are normal. She exhibits no distension. There is tenderness (mild, generalized). There is no rebound and no guarding.  Musculoskeletal: She exhibits no edema or tenderness.  Neurological: She is alert. She has normal strength. No cranial nerve deficit (no facial droop, extraocular movements intact, no slurred speech) or sensory deficit. She exhibits normal muscle tone. She displays no seizure activity. Coordination normal.  Skin: Skin is warm and dry. No rash noted.  Psychiatric: She has a normal mood and affect.  Nursing note and vitals reviewed.    ED Treatments / Results  Labs (all labs ordered are listed, but only abnormal results are displayed) Labs Reviewed  CBC - Abnormal; Notable for the following:       Result Value   RBC 3.78 (*)    Hemoglobin 11.4 (*)    HCT 32.6 (*)    All other components within normal limits  COMPREHENSIVE METABOLIC PANEL - Abnormal;  Notable for the following:    Chloride 114 (*)    Calcium 8.4 (*)    Total Protein 6.3 (*)    Anion gap 4 (*)    All other components within normal limits  URINALYSIS, ROUTINE W REFLEX MICROSCOPIC - Abnormal; Notable for the following:    APPearance HAZY (*)    All other components within normal limits  LIPASE, BLOOD  I-STAT BETA HCG BLOOD, ED ([facility], [facility], [facility] ONLY)    EKG  EKG Interpretation  Date/Time:  Sunday April 24 2017 09:29:12 EDT Ventricular Rate:  63 PR Interval:    QRS Duration: 82 QT Interval:  417 QTC Calculation: 427 R Axis:   69 Text Interpretation:  Sinus rhythm No old tracing to compare Confirmed by Linwood Dibbles [phone]) on 04/24/2017 9:45:24 AM       Procedures Procedures (including critical care time)  Medications Ordered in ED Medications  sodium chloride 0.9 % bolus 1,000 mL (0 mLs Intravenous Stopped 04/24/17 1116)    And  0.9 %  sodium chloride infusion (not administered)  gi cocktail (Maalox,Lidocaine,Donnatal) (30 mLs Oral Given 04/24/17 0933)  meclizine (ANTIVERT) tablet 25 mg (25 mg Oral Given 04/24/17 1118)     Initial Impression / Assessment and Plan / ED Course  I have reviewed the triage vital signs and the nursing notes.  Pertinent labs & imaging results that were available during my care of the patient were reviewed by me and considered in my medical decision making (see chart for details).  Clinical Course as of Apr 25 1223  Wynelle Link Apr 24, 2017  1209 Mild anemia.  No history of melena or GI bleeding.  I doubt this is clinically signigicant.    [JK]    Clinical Course User Index [JK] Linwood Dibbles, MD   Patient presents to emergency room with complaints of syncope associated with dizziness and persistent nausea. Patient's had a recent outpatient GI workup. She is scheduled for an endoscopy in August. Patient started having more severe symptoms again today. Patient states she had a brief syncopal episode associated with feeling lightheaded. Patient states  she continues to have a lightheaded sensation although initially felt better after IV fluids. Patient appears somewhat anxious and was asking if I thought a head CT was necessary. Patient is not having any focal neurologic symptoms. She has good strength in all extremities. She has normal sensation. Normal speech. No headache or neck pain.  I doubt that she's having a stroke, hemorrhage or any findings to suggest a cerebral tumor.  She does have history of anxiety and its possible this could be contributing factor.  Patient's laboratory tests are reassuring. I discussed her findings. I will discharge her home with prescription for Zofran. Follow up with her primary doctor next week. Return to the emergency room for worsening symptoms   Final Clinical Impressions(s) / ED Diagnoses   Final diagnoses:  Dizziness  Dehydration  Syncope, unspecified syncope type    New Prescriptions New Prescriptions   ONDANSETRON (ZOFRAN ODT) 8 MG DISINTEGRATING TABLET    Take 1 tablet (8 mg total) by mouth every 8 (eight) hours as needed for nausea or vomiting.     Linwood Dibbles, MD 04/24/17 1224

## 2017-04-24 NOTE — ED Notes (Signed)
Bed: WA11 Expected date:  Expected time:  Means of arrival:  Comments: EMS 

## 2017-04-30 ENCOUNTER — Emergency Department (HOSPITAL_COMMUNITY)
Admission: EM | Admit: 2017-04-30 | Discharge: 2017-05-01 | Disposition: A | Discharge status: Home / Self Care | Payer: PRIVATE HEALTH INSURANCE | Attending: Emergency Medicine | Admitting: Emergency Medicine

## 2017-04-30 ENCOUNTER — Encounter (HOSPITAL_COMMUNITY): Payer: Self-pay | Admitting: Emergency Medicine

## 2017-04-30 DIAGNOSIS — R55 Syncope and collapse: Secondary | ICD-10-CM

## 2017-04-30 DIAGNOSIS — Z91010 Allergy to peanuts: Secondary | ICD-10-CM | POA: Insufficient documentation

## 2017-04-30 DIAGNOSIS — R42 Dizziness and giddiness: Secondary | ICD-10-CM

## 2017-04-30 LAB — CBC
HEMATOCRIT: 37.9 % (ref 36.0–46.0)
Hemoglobin: 12.6 g/dL (ref 12.0–15.0)
MCH: 30 pg (ref 26.0–34.0)
MCHC: 33.2 g/dL (ref 30.0–36.0)
MCV: 90.2 fL (ref 78.0–100.0)
Platelets: 278 10*3/uL (ref 150–400)
RBC: 4.2 MIL/uL (ref 3.87–5.11)
RDW: 12.2 % (ref 11.5–15.5)
WBC: 8.1 10*3/uL (ref 4.0–10.5)

## 2017-04-30 LAB — BASIC METABOLIC PANEL
ANION GAP: 8 (ref 5–15)
BUN: 11 mg/dL (ref 6–20)
CO2: 22 mmol/L (ref 22–32)
Calcium: 9.4 mg/dL (ref 8.9–10.3)
Chloride: 108 mmol/L (ref 101–111)
Creatinine, Ser: 0.65 mg/dL (ref 0.44–1.00)
GFR calc Af Amer: >60 mL/min (ref >60)
GLUCOSE: 77 mg/dL (ref 65–99)
POTASSIUM: 3.9 mmol/L (ref 3.5–5.1)
Sodium: 138 mmol/L (ref 135–145)

## 2017-04-30 LAB — URINALYSIS, ROUTINE W REFLEX MICROSCOPIC
Bilirubin Urine: NEGATIVE
Glucose, UA: NEGATIVE mg/dL
Hgb urine dipstick: NEGATIVE
Ketones, ur: NEGATIVE mg/dL
LEUKOCYTES UA: NEGATIVE
NITRITE: NEGATIVE
PH: 6 (ref 5.0–8.0)
Protein, ur: NEGATIVE mg/dL
SPECIFIC GRAVITY, URINE: 1.009 (ref 1.005–1.030)

## 2017-04-30 LAB — CBG MONITORING, ED: GLUCOSE-CAPILLARY: 81 mg/dL (ref 65–99)

## 2017-04-30 LAB — I-STAT BETA HCG BLOOD, ED (MC, WL, AP ONLY): I-stat hCG, quantitative: <5 m[IU]/mL (ref <5)

## 2017-04-30 NOTE — ED Notes (Signed)
cbg 81 

## 2017-04-30 NOTE — ED Triage Notes (Addendum)
Patient reports multiple syncopal episodes this week [facility] the [facility] , near syncope  with lightheadedness / unsteady gait today . Alert and oriented . [facility] is [facility] .

## 2017-05-01 ENCOUNTER — Emergency Department (HOSPITAL_COMMUNITY): Payer: PRIVATE HEALTH INSURANCE

## 2017-05-01 MED ORDER — MECLIZINE HCL 25 MG PO TABS: 25.0000 mg | ORAL_TABLET | Freq: Three times a day (TID) | ORAL | 0 refills | Status: AC | PRN

## 2017-05-01 MED ORDER — ONDANSETRON HCL 4 MG/2ML IJ SOLN
4.0000 mg | Freq: Once | INTRAMUSCULAR | Status: AC
  Administered 2017-05-01: 4 mg via INTRAVENOUS
  Filled 2017-05-01: qty 2

## 2017-05-01 MED ORDER — PROMETHAZINE HCL 25 MG PO TABS: 25.0000 mg | ORAL_TABLET | Freq: Four times a day (QID) | ORAL | 0 refills | Status: AC | PRN

## 2017-05-01 MED ORDER — GADOBENATE DIMEGLUMINE 529 MG/ML IV SOLN
10.0000 mL | Freq: Once | INTRAVENOUS | Status: AC
  Administered 2017-05-01: 8 mL via INTRAVENOUS

## 2017-05-01 NOTE — Discharge Instructions (Signed)
All the results in the ER are normal, labs and imaging. We are not sure what is causing your symptoms. The workup in the ER is not complete, and is limited to screening for life threatening and emergent conditions only, so please see a primary care doctor for further evaluation.  [place] law prevents people with seizures or fainting from driving or operating dangerous machinery until they are free of seizures or fainting for 6 months.

## 2017-05-01 NOTE — ED Provider Notes (Addendum)
[facility]-EMERGENCY DEPT Provider Note   CSN: [id number] Arrival date & time: 04/30/17  2109 By signing my name below, I, Levon Hedger, attest that this documentation has been prepared under the direction and in the presence of Derwood Kaplan, MD . Electronically Signed: Levon Hedger, Scribe. 05/01/2017. 12:47 AM.   History   Chief Complaint Chief Complaint  Patient presents with  . Near Syncope    Lightheaded   HPI Dawn Price is a 31 y.o. female with a history of anxiety who presents to the Emergency Department complaining of constant, waxing and waning dizziness and near syncope onset one week ago. Pt reports associated generalized weakness, unsteady gait and fatigue. She also notes some left ear discomfort. Dizziness/lightheadedness is exacerbated by standing and with ROM of neck. No alleviating factors noted. She reports an unwitnessed syncopal episode on 04/24/17 which occurred while ambulating. She was admitted to [facility] following this episode and is currently wearing a cardiac monitor applied at [facility]. Pt had another syncopal episode on 04/28/17 while in [place] and was evaluated at [facility]. Per pt, she experiences room-spinning directly before losing consciousness. No family history of sudden unexplained death, cerebrah hemorrhage, or cardiac issues.  No personal history of PE/DVT or cancer. No recent foreign travel or surgeries. Pt denies any neck pain or stiffness.   The history is provided by the patient. No language interpreter was used.   Past Medical History:  Diagnosis Date  . Anxiety   . Bladder pain   . Dysuria   . GERD (gastroesophageal reflux disease)   . History of gastritis    mild per EGD 03-26-2015  . History of panic attacks   . History of pelvic inflammatory disease    PT reports this was misdiagnosed and should be removed.   . Major depressive disorder   . Meatal stenosis   . Urgency of urination   . UTI (lower urinary tract infection)    dx  11-08-2015  TREATED W/ KEFLEX  . Vitamin D deficiency   . Yeast infection    dx 11-08-2015  TREATED W/ DIFLUCAN    Patient Active Problem List   Diagnosis Date Noted  . History of gastritis 09/17/2015  . GERD (gastroesophageal reflux disease) 09/17/2015  . Pelvic pain in female 09/17/2015  . Loss of weight 09/17/2015  . Major depressive disorder 06/26/2014    Past Surgical History:  Procedure Laterality Date  . CYSTOSCOPY WITH HYDRODISTENSION AND BIOPSY N/A 06/27/2015   Procedure: CYSTOSCOPY/BLADDER BIOPSY/HYDRODISTENSION AND  INSTILLATION OF MARCAINE AND PYRIDIUM;  Surgeon: Ihor Gully, MD;  Location[facility];  Service: Urology;  Laterality: N/A;  . CYSTOSCOPY WITH URETHRAL DILATATION N/A 11/14/2015   Procedure: CYSTOSCOPY WITH Balloon URETHRAL DILATATION;  Surgeon: Ihor Gully, MD;  Location: [facility];  Service: Urology;  Laterality: N/A;  . ESOPHAGOGASTRODUODENOSCOPY  03-26-2015    OB History    Gravida Para Term Preterm AB Living   1             SAB TAB Ectopic Multiple Live Births                  Home Medications    Prior to Admission medications   Medication Sig Start Date End Date Taking? Authorizing Provider  alum & mag hydroxide-simeth (MAALOX/MYLANTA) 200-200-20 MG/5ML suspension Take 10 mLs by mouth every 6 (six) hours as needed for indigestion or heartburn.   Yes [provider]  B Complex-C (B-COMPLEX WITH VITAMIN C) tablet Take  1 tablet by mouth daily.   Yes [provider]  BIOTIN 5000 PO Take 5,000 mcg by mouth once a week.    Yes [provider]  ibuprofen (ADVIL,MOTRIN) 200 MG tablet Take 200 mg by mouth every 6 (six) hours as needed for moderate pain.    Yes [provider]  Multiple Vitamin (MULTIVITAMIN) tablet Take 1 tablet by mouth daily.   Yes [provider]  omeprazole (PRILOSEC) 20 MG capsule Take 20 mg by mouth daily.   Yes [provider]  ondansetron  (ZOFRAN ODT) 8 MG disintegrating tablet Take 1 tablet (8 mg total) by mouth every 8 (eight) hours as needed for nausea or vomiting. 04/24/17  Yes Linwood Dibbles, MD    Family History Family History  Problem Relation Age of Onset  . Kidney disease Maternal Aunt   . Heart disease Maternal Aunt   . Gallbladder disease Maternal Aunt   . Colon cancer Neg Hx   . Esophageal cancer Neg Hx   . Rectal cancer Neg Hx   . Stomach cancer Neg Hx     Social History Social History  Substance Use Topics  . Smoking status: Never Smoker  . Smokeless tobacco: Never Used  . Alcohol use No     Allergies   Macrobid [nitrofurantoin monohyd macro]; Almond oil; Doxycycline; and Peanuts [peanut oil]   Review of Systems Review of Systems All systems reviewed and are negative for acute change except as noted in the HPI.  Physical Exam Updated Vital Signs BP 98/62   Pulse (!) 58   Temp 98.5 F (36.9 C) (Oral)   Resp 15   Ht 5' (1.524 m)   Wt 39.9 kg (88 lb)   LMP 04/25/2017   SpO2 100%   BMI 17.19 kg/m   Physical Exam  Constitutional: She is oriented to person, place, and time. She appears well-developed and well-nourished. No distress.  HENT:  Head: Normocephalic and atraumatic.  Right Ear: Tympanic membrane normal.  Left Ear: Tympanic membrane normal.  Eyes: Conjunctivae are normal. Right eye exhibits no nystagmus. Left eye exhibits no nystagmus.  Neck:  No carotid bruits  Cardiovascular: Regular rhythm.  Tachycardia present.   2+ and equal radial pulse bilaterally   Pulmonary/Chest: Effort normal.  Lungs CTA bilaterally   Abdominal: She exhibits no distension.  Neurological: She is alert and oriented to person, place, and time.  CN 2-12 intact. Upper and lower extremity gross motor exam and sensory exam are normal. Cerebellar exam reveal no dysmetria. Hints exam attempted. Pt's dizziness gets worse with left lateral neck movement, however no specific nystagmus appreciated.   Skin: Skin is  warm and dry.  Nursing note and vitals reviewed.  ED Treatments / Results  DIAGNOSTIC STUDIES:  Oxygen Saturation is 100% on RA, normal by my interpretation.    COORDINATION OF CARE:  12:28 AM Discussed treatment plan with pt at bedside and pt agreed to plan.   Labs (all labs ordered are listed, but only abnormal results are displayed) Labs Reviewed  URINALYSIS, ROUTINE W REFLEX MICROSCOPIC - Abnormal; Notable for the following:       Result Value   APPearance HAZY (*)    All other components within normal limits  BASIC METABOLIC PANEL  CBC  CBG MONITORING, ED  CBG MONITORING, ED  I-STAT BETA HCG BLOOD, ED ([facility], [facility], [facility] ONLY)    EKG  EKG Interpretation  Date/Time:  Saturday April 30 2017 21:20:07 EDT Ventricular Rate:  79 PR Interval:  128 QRS Duration: 80 QT Interval:  368 QTC Calculation: 421 R Axis:   62 Text Interpretation:  Normal sinus rhythm Right atrial enlargement Borderline ECG No acute changes No significant change since last tracing Confirmed by Derwood Kaplananavati, Evalyn Shultis ([id number]) on 05/01/2017 12:06:20 AM       Radiology Mr Maxine GlennMra Head Wo Contrast  Result Date: 05/01/2017 CLINICAL DATA:  Constant dizziness.  Symptoms for a week. EXAM: MRA NECK WITHOUT AND WITH CONTRAST MRA HEAD WITHOUT CONTRAST TECHNIQUE: Multiplanar and multiecho pulse sequences of the neck were obtained without and with intravenous contrast. Angiographic images of the neck were obtained using MRA technique without and with intravenous contast.; Angiographic images of the Circle of Willis were obtained using MRA technique without intravenous contrast. CONTRAST:  8mL MULTIHANCE GADOBENATE DIMEGLUMINE 529 MG/ML IV SOLN COMPARISON:  None. FINDINGS: MRA NECK FINDINGS Time-of-flight imaging of the neck shows antegrade flow with carotid and vertebral arteries. Postcontrast imaging shows a normal diameter aortic arch. There is 3 vessel arch branching. No proximal subclavian or brachiocephalic stenosis. As permitted by  venous contamination at level of the V3 segments, the vertebral arteries are smooth and widely patent. Both carotids are smooth and widely patent. No incidental mass or inflammation of the neck. MRA HEAD FINDINGS Symmetric carotid and vertebral arteries. Incomplete circle-of-Willis at the left posterior communicating artery. Incidental proximal basilar fenestration. The left pica and right aica are dominant. No branch occlusion, stenosis, or aneurysm. Negative for vessel beading. IMPRESSION: Normal MRA of the head and neck. Electronically Signed   By: Marnee SpringJonathon  Watts M.D.   On: 05/01/2017 07:18   Mr Angiogram Neck W Or Wo Contrast  Result Date: 05/01/2017 CLINICAL DATA:  Constant dizziness.  Symptoms for a week. EXAM: MRA NECK WITHOUT AND WITH CONTRAST MRA HEAD WITHOUT CONTRAST TECHNIQUE: Multiplanar and multiecho pulse sequences of the neck were obtained without and with intravenous contrast. Angiographic images of the neck were obtained using MRA technique without and with intravenous contast.; Angiographic images of the Circle of Willis were obtained using MRA technique without intravenous contrast. CONTRAST:  8mL MULTIHANCE GADOBENATE DIMEGLUMINE 529 MG/ML IV SOLN COMPARISON:  None. FINDINGS: MRA NECK FINDINGS Time-of-flight imaging of the neck shows antegrade flow with carotid and vertebral arteries. Postcontrast imaging shows a normal diameter aortic arch. There is 3 vessel arch branching. No proximal subclavian or brachiocephalic stenosis. As permitted by venous contamination at level of the V3 segments, the vertebral arteries are smooth and widely patent. Both carotids are smooth and widely patent. No incidental mass or inflammation of the neck. MRA HEAD FINDINGS Symmetric carotid and vertebral arteries. Incomplete circle-of-Willis at the left posterior communicating artery. Incidental proximal basilar fenestration. The left pica and right aica are dominant. No branch occlusion, stenosis, or aneurysm.  Negative for vessel beading. IMPRESSION: Normal MRA of the head and neck. Electronically Signed   By: Marnee SpringJonathon  Watts M.D.   On: 05/01/2017 07:18    Procedures Procedures (including critical care time)  Medications Ordered in ED Medications  ondansetron (ZOFRAN) injection 4 mg (4 mg Intravenous Given 05/01/17 0455)  gadobenate dimeglumine (MULTIHANCE) injection 10 mL (8 mLs Intravenous Contrast Given 05/01/17 0715)     Initial Impression / Assessment and Plan / ED Course  I have reviewed the triage vital signs and the nursing notes.  Pertinent labs & imaging results that were available during my care of the patient were reviewed by me and considered in my medical decision making (see chart for details).  Clinical Course as of May 01 1609  Sun May 01, 2017  0050 Dr. Leroy Kennedy recommends MRI head and neck   [EH]  [phone] Results from the ER workup discussed with the patient face to face and all questions answered to the best of my ability.  MR Angiogram Neck W or Wo Contrast [AN]    Clinical Course User Index [AN] Derwood Kaplan, MD [EH] Levon Hedger     Final Clinical Impressions(s) / ED Diagnoses   Final diagnoses:  Near syncope  Dizziness   Pt comes in with cc of dizziness. Constant dizziness. No focal neuro deficits. With head movement, patient's symptoms are worse. She has constant dizziness for the past several days with 2 episodes of syncope and several near syncope spells, including today. Pt has been to [facility], [facility], [facility] - and had a neg ct head, echo and has a zio patch on at this time. I think her symptoms are neuro-vascular - and neuro consulted to get recommendation on imaging.  New Prescriptions New Prescriptions   No medications on file   I personally performed the services described in this documentation, which was scribed in my presence. The recorded information has been reviewed and is accurate.    Derwood Kaplan, MD 05/01/17 5409    Derwood Kaplan,  MD 05/01/17 [phone]

## 2017-05-01 NOTE — ED Notes (Signed)
ED Provider at bedside. 

## 2017-05-01 NOTE — ED Notes (Signed)
To MRI at this time.

## 2017-05-03 DIAGNOSIS — Z79899 Other long term (current) drug therapy: Secondary | ICD-10-CM | POA: Insufficient documentation

## 2017-05-03 DIAGNOSIS — I951 Orthostatic hypotension: Secondary | ICD-10-CM | POA: Insufficient documentation

## 2017-05-03 DIAGNOSIS — Z91010 Allergy to peanuts: Secondary | ICD-10-CM | POA: Insufficient documentation

## 2017-05-03 DIAGNOSIS — R55 Syncope and collapse: Secondary | ICD-10-CM | POA: Diagnosis present

## 2017-05-04 ENCOUNTER — Encounter (HOSPITAL_COMMUNITY): Payer: Self-pay | Admitting: Emergency Medicine

## 2017-05-04 ENCOUNTER — Emergency Department (HOSPITAL_COMMUNITY)
Admission: EM | Admit: 2017-05-04 | Discharge: 2017-05-04 | Disposition: A | Discharge status: Home / Self Care | Payer: No Typology Code available for payment source | Attending: Emergency Medicine | Admitting: Emergency Medicine

## 2017-05-04 DIAGNOSIS — I951 Orthostatic hypotension: Secondary | ICD-10-CM

## 2017-05-04 LAB — URINALYSIS, ROUTINE W REFLEX MICROSCOPIC
Bilirubin Urine: NEGATIVE
GLUCOSE, UA: NEGATIVE mg/dL
HGB URINE DIPSTICK: NEGATIVE
Ketones, ur: NEGATIVE mg/dL
LEUKOCYTES UA: NEGATIVE
Nitrite: NEGATIVE
PH: 8 (ref 5.0–8.0)
Protein, ur: NEGATIVE mg/dL
Specific Gravity, Urine: 1.014 (ref 1.005–1.030)

## 2017-05-04 LAB — I-STAT BETA HCG BLOOD, ED (MC, WL, AP ONLY): I-stat hCG, quantitative: <5 m[IU]/mL (ref <5)

## 2017-05-04 LAB — BASIC METABOLIC PANEL
Anion gap: 6 (ref 5–15)
BUN: 9 mg/dL (ref 6–20)
CHLORIDE: 107 mmol/L (ref 101–111)
CO2: 25 mmol/L (ref 22–32)
Calcium: 9.1 mg/dL (ref 8.9–10.3)
Creatinine, Ser: 0.58 mg/dL (ref 0.44–1.00)
GFR calc Af Amer: >60 mL/min (ref >60)
GFR calc non Af Amer: >60 mL/min (ref >60)
Glucose, Bld: 96 mg/dL (ref 65–99)
POTASSIUM: 4.1 mmol/L (ref 3.5–5.1)
SODIUM: 138 mmol/L (ref 135–145)

## 2017-05-04 LAB — CBC
HEMATOCRIT: 36.2 % (ref 36.0–46.0)
Hemoglobin: 11.7 g/dL — ABNORMAL LOW (ref 12.0–15.0)
MCH: 29.6 pg (ref 26.0–34.0)
MCHC: 32.3 g/dL (ref 30.0–36.0)
MCV: 91.6 fL (ref 78.0–100.0)
Platelets: 274 10*3/uL (ref 150–400)
RBC: 3.95 MIL/uL (ref 3.87–5.11)
RDW: 12.5 % (ref 11.5–15.5)
WBC: 7.7 10*3/uL (ref 4.0–10.5)

## 2017-05-04 LAB — I-STAT TROPONIN, ED: TROPONIN I, POC: 0 ng/mL (ref 0.00–0.08)

## 2017-05-04 LAB — D-DIMER, QUANTITATIVE: D-Dimer, Quant: 0.43 ug/mL-FEU (ref 0.00–0.50)

## 2017-05-04 MED ORDER — SODIUM CHLORIDE 0.9 % IV BOLUS (SEPSIS)
1000.0000 mL | Freq: Once | INTRAVENOUS | Status: AC
  Administered 2017-05-04: 1000 mL via INTRAVENOUS

## 2017-05-04 NOTE — ED Notes (Signed)
Patient in car waiting. When called to reassess vital signs.

## 2017-05-04 NOTE — ED Provider Notes (Signed)
TIME SEEN: 4:05 AM  CHIEF COMPLAINT: Syncope  HPI: Patient is a 31 year old female with history of anxiety who presents to the emergency department with complaints of multiple syncopal events. Patient has been evaluated in multiple emergency departments for the same. States that symptoms mostly start whenever she changes position. She states she will feel lightheaded and having blurry vision. Denies any vertigo. She states she does get a buzzing in her ears and her ears will feel full. She has been taking meclizine without relief. No chest pain or shortness of breath, vomiting or diarrhea, bloody stools or melena, heavy vaginal bleeding. She was admitted to [facility] and had an echocardiogram which showed an EF of 73% and no other acute abnormality. She is currently wearing a Zio patch. She was also recently seen in our emergency department and had MRA of her head and neck which were normal. She states she has been eating and drinking recently. She states that she has not followed up with her primary care physician yet. She denies any seizure activity. No postictal period. No tongue biting, incontinence.  ROS: See HPI Constitutional: no fever  Eyes: no drainage  ENT: no runny nose   Cardiovascular:  no chest pain  Resp: no SOB  GI: no vomiting GU: no dysuria Integumentary: no rash  Allergy: no hives  Musculoskeletal: no leg swelling  Neurological: no slurred speech ROS otherwise negative  PAST MEDICAL HISTORY/PAST SURGICAL HISTORY:  Past Medical History:  Diagnosis Date  . Anxiety   . Bladder pain   . Dysuria   . GERD (gastroesophageal reflux disease)   . History of gastritis    mild per EGD 03-26-2015  . History of panic attacks   . History of pelvic inflammatory disease    PT reports this was misdiagnosed and should be removed.   . Major depressive disorder   . Meatal stenosis   . Urgency of urination   . UTI (lower urinary tract infection)    dx 11-08-2015  TREATED W/ KEFLEX  .  Vitamin D deficiency   . Yeast infection    dx 11-08-2015  TREATED W/ DIFLUCAN    MEDICATIONS:  Prior to Admission medications   Medication Sig Start Date End Date Taking? Authorizing Provider  alum & mag hydroxide-simeth (MAALOX/MYLANTA) 200-200-20 MG/5ML suspension Take 10 mLs by mouth every 6 (six) hours as needed for indigestion or heartburn.    [provider]  B Complex-C (B-COMPLEX WITH VITAMIN C) tablet Take 1 tablet by mouth daily.    [provider]  BIOTIN 5000 PO Take 5,000 mcg by mouth once a week.     [provider]  ibuprofen (ADVIL,MOTRIN) 200 MG tablet Take 200 mg by mouth every 6 (six) hours as needed for moderate pain.     [provider]  meclizine (ANTIVERT) 25 MG tablet Take 1 tablet (25 mg total) by mouth 3 (three) times daily as needed for dizziness. 05/01/17   Derwood Kaplan, MD  Multiple Vitamin (MULTIVITAMIN) tablet Take 1 tablet by mouth daily.    [provider]  omeprazole (PRILOSEC) 20 MG capsule Take 20 mg by mouth daily.    [provider]  ondansetron (ZOFRAN ODT) 8 MG disintegrating tablet Take 1 tablet (8 mg total) by mouth every 8 (eight) hours as needed for nausea or vomiting. 04/24/17   Linwood Dibbles, MD  promethazine (PHENERGAN) 25 MG tablet Take 1 tablet (25 mg total) by mouth every 6 (six) hours as needed for nausea. 05/01/17  Derwood KaplanNanavati, Ankit, MD    ALLERGIES:  Allergies  Allergen Reactions  . Macrobid [Nitrofurantoin [employer]Monohyd Macro] Hives  . Almond Oil Rash  . Doxycycline Nausea Only  . Peanuts [Peanut Oil] Rash    SOCIAL HISTORY:  Social History  Substance Use Topics  . Smoking status: Never Smoker  . Smokeless tobacco: Never Used  . Alcohol use No    FAMILY HISTORY: Family History  Problem Relation Age of Onset  . Kidney disease Maternal Aunt   . Heart disease Maternal Aunt   . Gallbladder disease Maternal Aunt   . Colon cancer Neg Hx   . Esophageal cancer Neg Hx   . Rectal cancer  Neg Hx   . Stomach cancer Neg Hx     EXAM: BP (!) 98/57 (BP Location: Left Arm)   Pulse 85   Temp 98.3 F (36.8 C) (Oral)   Resp 18   Ht 5' (1.524 m)   Wt 38.6 kg (85 lb)   LMP 04/25/2017   SpO2 100%   BMI 16.60 kg/m  CONSTITUTIONAL: Alert and oriented and responds appropriately to questions. Well-appearing; well-nourished HEAD: Normocephalic EYES: Conjunctivae clear, pupils appear equal, EOMI ENT: normal nose; moist mucous membranes; No pharyngeal erythema or petechiae, no tonsillar hypertrophy or exudate, no uvular deviation, no unilateral swelling, no trismus or drooling, no muffled voice, normal phonation, no stridor, no dental caries present, no drainable dental abscess noted, no Ludwig's angina, tongue sits flat in the bottom of the mouth, no angioedema, no facial erythema or warmth, no facial swelling; no pain with movement of the neck.  TMs are clear bilaterally without erythema, purulence, bulging, perforation, effusion.  No cerumen impaction or sign of foreign body in the external auditory canal. No inflammation, erythema or drainage from the external auditory canal. No signs of mastoiditis. No pain with manipulation of the pinna bilaterally. NECK: Supple, no meningismus, no nuchal rigidity, no LAD  CARD: RRR; S1 and S2 appreciated; no murmurs, no clicks, no rubs, no gallops RESP: Normal chest excursion without splinting or tachypnea; breath sounds clear and equal bilaterally; no wheezes, no rhonchi, no rales, no hypoxia or respiratory distress, speaking full sentences ABD/GI: Normal bowel sounds; non-distended; soft, non-tender, no rebound, no guarding, no peritoneal signs, no hepatosplenomegaly BACK:  The back appears normal and is non-tender to palpation, there is no CVA tenderness EXT: Normal ROM in all joints; non-tender to palpation; no edema; normal capillary refill; no cyanosis, no calf tenderness or swelling    SKIN: Normal color for age and race; warm; no rash NEURO:  Moves all extremities equally; Strength 5/5 in all four extremities.  Normal sensation diffusely.  CN 2-12 grossly intact.  No dysmetria to finger to nose testing bilaterally.  Normal speech.  Normal gait. PSYCH: The patient's mood and manner are appropriate. Grooming and personal hygiene are appropriate.  MEDICAL DECISION MAKING: Patient here with recurrent syncopal events.  Patient has had a quite extensive workup. [facility] she had a normal echocardiogram. She is currently wearing a CytogeneticistZio Patch.  She has also had a negative MRA of her head and neck. She is orthostatic here. Will hydrate patient. We'll repeat labs, urine. She is no focal neurologic deficits. No chest pain or shortness of breath. States symptoms worse with changes in position. She does have some tinnitus and ear fullness but her TMs appear normal without cerumen impaction or foreign body. Meclizine is not helping her symptoms and she denies vertigo.  ED PROGRESS: Labs unremarkable. Negative troponin. Negative  d-dimer. She is not pregnant. Hemoglobin is normal. She has received 2 L of IV fluids and her blood pressure has improved. She reports feeling better. Have recommended close follow-up with her primary care physician. She is comfortable with this plan. Recommended increased fluid intake at home and discussed with her that she should not be driving a vehicle until he symptoms have resolved.  At this time, I do not feel there is any life-threatening condition present. I have reviewed and discussed all results (EKG, imaging, lab, urine as appropriate) and exam findings with patient/family. I have reviewed nursing notes and appropriate previous records.  I feel the patient is safe to be discharged home without further emergent workup and can continue workup as an outpatient as needed. Discussed usual and customary return precautions. Patient/family verbalize understanding and are comfortable with this plan.  Outpatient follow-up has been  provided if needed. All questions have been answered.      EKG Interpretation  Date/Time:  Wednesday May 04 2017 00:02:45 EDT Ventricular Rate:  82 PR Interval:  128 QRS Duration: 82 QT Interval:  370 QTC Calculation: 432 R Axis:   75 Text Interpretation:  Normal sinus rhythm with sinus arrhythmia Normal ECG Confirmed by Tallia Moehring, Baxter Hire [phone]) on 05/04/2017 4:05:26 AM         Gionni Freese, Layla Maw, DO 05/04/17 6045

## 2017-05-04 NOTE — ED Triage Notes (Signed)
Reports having multiple syncopal episodes for the last week.  Has been seen three times and three different hospitals.  Was given meclizine but reports it made the dizziness worse.  Also c/o head feeling "full".

## 2017-05-04 NOTE — ED Notes (Signed)
Patient says that she would be more comfortable waiting in car.  [phone]

## 2017-05-05 ENCOUNTER — Ambulatory Visit (INDEPENDENT_AMBULATORY_CARE_PROVIDER_SITE_OTHER): Payer: No Typology Code available for payment source | Admitting: Family Medicine

## 2017-05-05 ENCOUNTER — Encounter: Payer: Self-pay | Admitting: Family Medicine

## 2017-05-05 VITALS — BP 92/60 | HR 94 | Temp 98.6°F | Resp 16 | Ht 61.0 in | Wt 90.8 lb

## 2017-05-05 DIAGNOSIS — R55 Syncope and collapse: Secondary | ICD-10-CM

## 2017-05-05 DIAGNOSIS — D649 Anemia, unspecified: Secondary | ICD-10-CM | POA: Diagnosis not present

## 2017-05-05 NOTE — Patient Instructions (Addendum)
Always drink more fluids  Increase salt in your diet  Do exercises that shift your position with fairly quick shifts so your body can learn to accommodate better.  Try to increase the iron intake in your diet, and I recommend taking an iron pill every day for a little while to try to build up your marrow iron stores.  Do not believe additional testing is needed at this time, and I think what's that you get pass the stress of change in your life you will do better.  Return as needed   Vasovagal Syncope, Adult Syncope, which is commonly known as fainting or passing out, is a temporary loss of consciousness. It occurs when the blood flow to the brain is reduced. Vasovagal syncope, also called neurocardiogenic syncope, is a fainting spell that happens when blood flow to the brain is reduced because of a sudden drop in heart rate and blood pressure. Vasovagal syncope is usually harmless. However, you can get injured if you fall during a fainting spell. What are the causes? This condition is caused by a drop in heart rate and blood pressure, usually in response to a trigger. Many things and situations can trigger an episode, including:  Pain.  Fear.  The sight of blood. This may occur during medical procedures, such as when blood is being drawn from a vein.  Common activities, such as coughing, swallowing, stretching, or going to the bathroom.  Emotional stress.  Being in a confined space.  Prolonged standing, especially in a warm environment.  Lack of sleep or rest.  Not eating for a long time.  Not drinking enough liquids.  Recent illness.  Drinking alcohol.  Taking drugs that affect blood pressure, such as marijuana, cocaine, opiates, or inhalants.  What are the signs or symptoms? Before a fainting episode, you may:  Feel dizzy or light-headed.  Become pale.  Sense that you are going to faint.  Feel like the room is spinning.  Only see directly ahead (tunnel  vision).  Feel sick to your stomach (nauseous).  See spots.  Slowly lose vision.  Hear ringing in your ears.  Have a headache.  Feel warm and sweaty.  Feel a sensation of pins and needles.  During the fainting spell, you may twitch or make jerky movements. Fainting spells usually last no longer than a few minutes before you wake up. If you get up too quickly before your body can recover, you may faint again. How is this diagnosed? This condition is diagnosed based on your symptoms, your medical history, and a physical exam. Tests may be done to rule out other causes of fainting. Tests may include:  Blood tests.  Heart tests, such as an electrocardiogram (ECG), echocardiogram, or electrophysiology study.  A test to check your response to changes in position (tilt table test).  How is this treated? Usually, treatment is not needed for this condition. Your health care provider may suggest ways to help prevent fainting episodes. These may include:  Drinking additional fluids if you are exposed to a trigger.  Sitting or lying down if you notice signs that an episode is coming.  If your fainting spells continue, your health care provider may recommend that you:  Take medicines to prevent fainting or to help reduce further episodes of fainting.  Do certain exercises.  Wear compression stockings.  Have surgery to place a pacemaker in your body (rare).  Follow these instructions at home:  Learn to identify the signs that an episode is  coming.  Sit or lie down at the first sign of a fainting spell. If you sit down, put your head down between your legs. If you lie down, swing your legs up in the air to increase blood flow to the brain.  Avoid hot tubs and saunas.  Avoid standing for a long time. If you have to stand for a long time, try: ? Crossing your legs. ? Flexing and stretching your leg muscles. ? Squatting. ? Moving your legs. ? Bending over.  Drink enough fluid  to keep your urine clear or pale yellow.  Make changes to your diet that your health care provider recommends. You may be told to: ? Avoid caffeine. ? Eat more salt.  Take over-the-counter and prescription medicines only as told by your health care provider. Contact a health care provider if:  You continue to have fainting spells despite treatment.  You faint more often despite treatment.  You lose consciousness for more than a few minutes.  You faint during or after exercising or after being startled.  You have twitching or jerky movements for longer than a few seconds during a fainting spell.  You have an episode of twitching or jerky movements without fainting. Get help right away if:  A fainting spell leads to an injury or bleeding.  You have new symptoms that occur with the fainting spells, such as: ? Shortness of breath. ? Chest pain. ? Irregular heartbeat.  You twitch or make jerky movements for more than 5 minutes.  You twitch or make jerky movements during more than one fainting spell. This information is not intended to replace advice given to you by your health care provider. Make sure you discuss any questions you have with your health care provider. Document Released: 09/27/2012 Document Revised: 03/24/2016 Document Reviewed: 08/09/2015 Elsevier Interactive Patient Education  2018 [employer].     IF you received an x-ray today, you will receive an invoice from [facility]. Please contact [facility] at [phone] with questions or concerns regarding your invoice.   IF you received labwork today, you will receive an invoice from [facility]. Please contact [facility] at [phone] with questions or concerns regarding your invoice.   Our billing staff will not be able to assist you with questions regarding bills from these companies.  You will be contacted with the lab results as soon as they are available. The fastest way to get your  results is to activate your My Chart account. Instructions are located on the last page of this paperwork. If you have not heard from [place] regarding the results in 2 weeks, please contact this office.

## 2017-05-05 NOTE — Progress Notes (Signed)
Patient ID: Dawn Price, female    DOB: 05/28/1986  Age: 31 y.o. MRN: [id number]  Chief Complaint  Patient presents with  . Loss of Consciousness    per patient, began having dizziness with menses on 03/27/17. Dizziness continued, began having episodes of fainting appx 2 weeks ago. Per patient, no injury with the fainting.  . Dizziness    per patient, feels "tingling" in the back of her head most days, nothing today    Subjective:   31 year old lady whom I have seen a number of times in the past. Over the last month she has had 3 episodes of vasovagal syncope. She has been to the emergency room and workup a couple of times. She is gotten gotten lightheaded and falling down. No injuries. Yesterday and today she is felt a little bit better. 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 She will be working on influenza. She is here with a friend from [place]. She admits to being anxious a lot and we talked about that. She is Buddhist. She has had some left-sided headaches, but has had workup including MRI.  Current allergies, medications, problem list, past/family and social histories reviewed.  Objective:  BP 92/60 (BP Location: Right Arm, Patient Position: Sitting, Cuff Size: Normal)   Pulse 94   Temp 98.6 F (37 C) (Oral)   Resp 16   Ht 5\' 1"  (1.549 m)   Wt 90 lb 12.8 oz (41.2 kg)   LMP 04/21/2017   SpO2 99%   BMI 17.16 kg/m   Reviewed her old studies which very extensive. Discussed her chronic low blood pressure with her.  TMs normal. Eyes PERRLA. Fundi benign. Discs flat. Throat clear. Neck supple without nodes. Chest clear. Heart regular without murmur. Abdomen soft without mass or tenderness. Romberg negative. Gait normal. Tandem walk normal. Finger-nose normal. Foley alert and oriented.  Assessment & Plan:   Assessment: 1. Vasovagal syncope   2. Anemia, unspecified type       Plan: See instructions.  No orders of the defined types  were placed in this encounter.   No orders of the defined types were placed in this encounter.        Patient Instructions   Always drink more fluids  Increase salt in your diet  Do exercises that shift your position with fairly quick shifts so your body can learn to accommodate better.  Try to increase the iron intake in your diet, and I recommend taking an iron pill every day for a little while to try to build up your marrow iron stores.  Do not believe additional testing is needed at this time, and I think what's that you get pass the stress of change in your life you will do better.  Return as needed   Vasovagal Syncope, Adult Syncope, which is commonly known as fainting or passing out, is a temporary loss of consciousness. It occurs when the blood flow to the brain is reduced. Vasovagal syncope, also called neurocardiogenic syncope, is a fainting spell that happens when blood flow to the brain is reduced because of a sudden drop in heart rate and blood pressure. Vasovagal syncope is usually harmless. However, you can get injured if you fall during a fainting spell. What are the causes? This condition is caused by a drop in heart rate and blood pressure, usually in response to a trigger. Many things and situations can trigger an episode, including:  Pain.  Fear.  The sight of blood. This may occur during medical procedures, such as when blood is being drawn from a vein.  Common activities, such as coughing, swallowing, stretching, or going to the bathroom.  Emotional stress.  Being in a confined space.  Prolonged standing, especially in a warm environment.  Lack of sleep or rest.  Not eating for a long time.  Not drinking enough liquids.  Recent illness.  Drinking alcohol.  Taking drugs that affect blood pressure, such as marijuana, cocaine, opiates, or inhalants.  What are the signs or symptoms? Before a fainting episode, you may:  Feel dizzy or  light-headed.  Become pale.  Sense that you are going to faint.  Feel like the room is spinning.  Only see directly ahead (tunnel vision).  Feel sick to your stomach (nauseous).  See spots.  Slowly lose vision.  Hear ringing in your ears.  Have a headache.  Feel warm and sweaty.  Feel a sensation of pins and needles.  During the fainting spell, you may twitch or make jerky movements. Fainting spells usually last no longer than a few minutes before you wake up. If you get up too quickly before your body can recover, you may faint again. How is this diagnosed? This condition is diagnosed based on your symptoms, your medical history, and a physical exam. Tests may be done to rule out other causes of fainting. Tests may include:  Blood tests.  Heart tests, such as an electrocardiogram (ECG), echocardiogram, or electrophysiology study.  A test to check your response to changes in position (tilt table test).  How is this treated? Usually, treatment is not needed for this condition. Your health care provider may suggest ways to help prevent fainting episodes. These may include:  Drinking additional fluids if you are exposed to a trigger.  Sitting or lying down if you notice signs that an episode is coming.  If your fainting spells continue, your health care provider may recommend that you:  Take medicines to prevent fainting or to help reduce further episodes of fainting.  Do certain exercises.  Wear compression stockings.  Have surgery to place a pacemaker in your body (rare).  Follow these instructions at home:  Learn to identify the signs that an episode is coming.  Sit or lie down at the first sign of a fainting spell. If you sit down, put your head down between your legs. If you lie down, swing your legs up in the air to increase blood flow to the brain.  Avoid hot tubs and saunas.  Avoid standing for a long time. If you have to stand for a long time,  try: ? Crossing your legs. ? Flexing and stretching your leg muscles. ? Squatting. ? Moving your legs. ? Bending over.  Drink enough fluid to keep your urine clear or pale yellow.  Make changes to your diet that your health care provider recommends. You may be told to: ? Avoid caffeine. ? Eat more salt.  Take over-the-counter and prescription medicines only as told by your health care provider. Contact a health care provider if:  You continue to have fainting spells despite treatment.  You faint more often despite treatment.  You lose consciousness for more than a few minutes.  You faint during or after exercising or after being startled.  You have twitching or jerky movements for longer than a few seconds during a fainting spell.  You have an episode of twitching or jerky movements without fainting. Get help right  away if:  A fainting spell leads to an injury or bleeding.  You have new symptoms that occur with the fainting spells, such as: ? Shortness of breath. ? Chest pain. ? Irregular heartbeat.  You twitch or make jerky movements for more than 5 minutes.  You twitch or make jerky movements during more than one fainting spell. This information is not intended to replace advice given to you by your health care provider. Make sure you discuss any questions you have with your health care provider. Document Released: 09/27/2012 Document Revised: 03/24/2016 Document Reviewed: 08/09/2015 Elsevier Interactive Patient Education  2018 [employer]     IF you received an x-ray today, you will receive an invoice from [facility]. Please contact [facility] at [phone] with questions or concerns regarding your invoice.   IF you received labwork today, you will receive an invoice from [facility]. Please contact [facility] at [phone] with questions or concerns regarding your invoice.   Our billing staff will not be able to assist you with questions  regarding bills from these companies.  You will be contacted with the lab results as soon as they are available. The fastest way to get your results is to activate your My Chart account. Instructions are located on the last page of this paperwork. If you have not heard from us regarding the results in 2 weeks, please contact this office.        Return if symptoms worsen or fail to improve.   Chariah Bailey, MD 05/05/2017

## 2017-05-13 ENCOUNTER — Ambulatory Visit: Payer: No Typology Code available for payment source | Admitting: Family Medicine

## 2018-03-10 ENCOUNTER — Encounter: Payer: Self-pay | Admitting: Family Medicine

## 2018-03-15 ENCOUNTER — Encounter: Payer: Self-pay | Admitting: Family Medicine

## 2019-02-17 IMAGING — MR MR MRA NECK WO/W CM
4 of 11 series · 19 of 48 positions shown · IV contrast (multihance)
Comparison: None.

CLINICAL DATA: Constant dizziness.  Symptoms for a week.

EXAM:
MRA NECK WITHOUT AND WITH CONTRAST
MRA HEAD WITHOUT CONTRAST
TECHNIQUE: Multiplanar and multiecho pulse sequences of the neck were obtained
without and with intravenous contrast. Angiographic images of the
neck were obtained using MRA technique without and with intravenous
contast.; Angiographic images of the Circle of Willis were obtained
using MRA technique without intravenous contrast.
CONTRAST:  8mL MULTIHANCE GADOBENATE DIMEGLUMINE 529 MG/ML IV SOLN

[Series 3: ax (id) 2 · axial · 1.0mm · 0.43mm/px · z∈[-99,-15]mm · 7 of 184 slices shown]
[im 1/184]
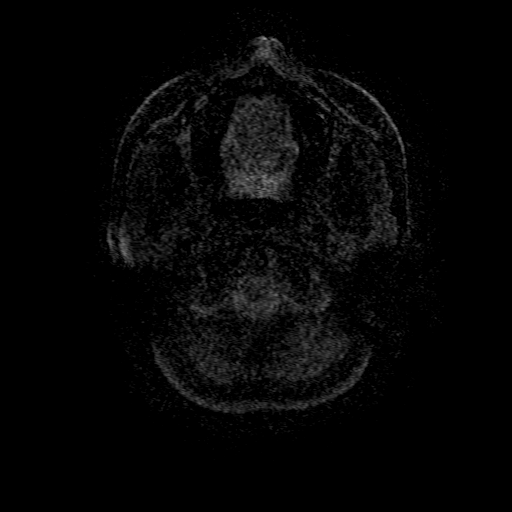
[im 31/184]
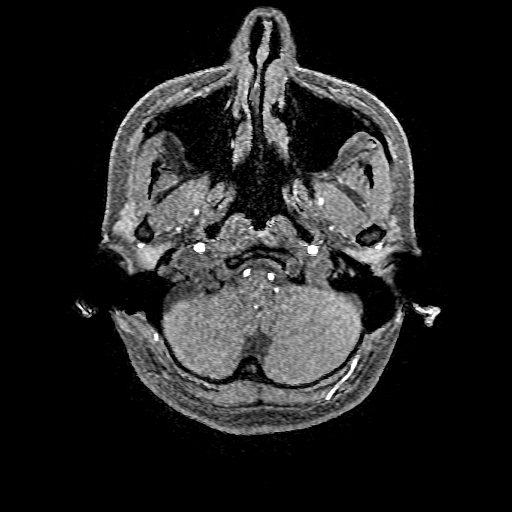
[im 62/184]
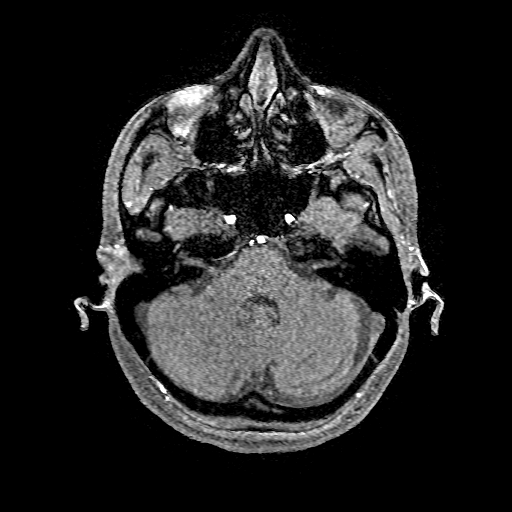
[im 92/184]
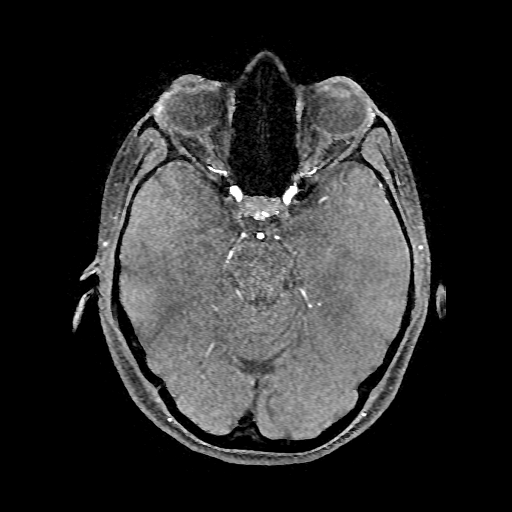
[im 123/184]
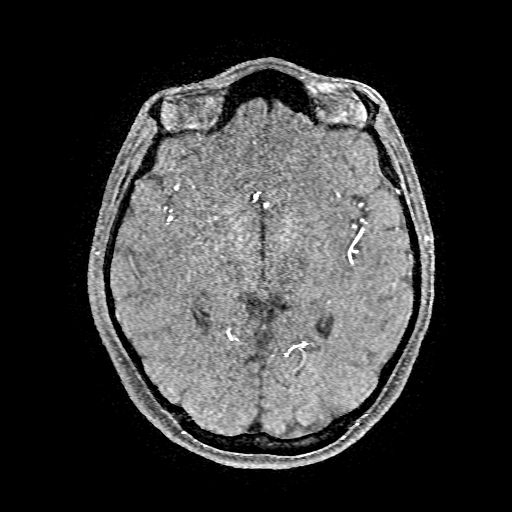
[im 153/184]
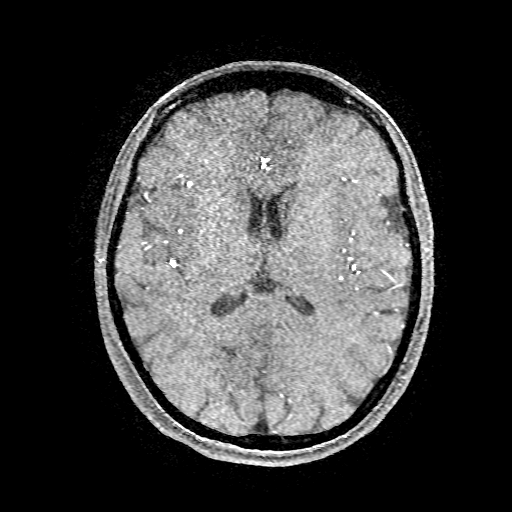
[im 184/184]
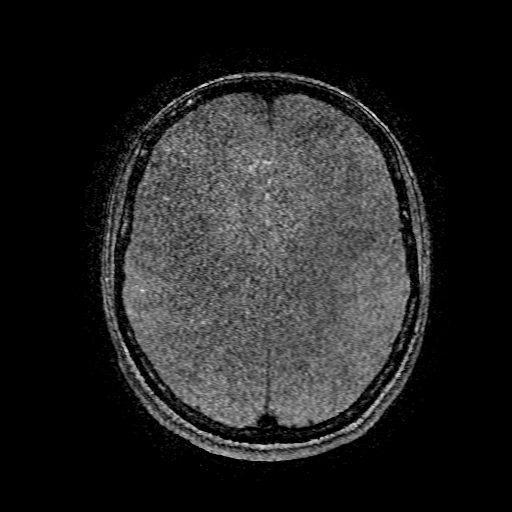

[Series 7: sag inhance (id) · sagittal · 1.2mm · 0.47mm/px · 6 of 360 slices shown]
[im 1/360]
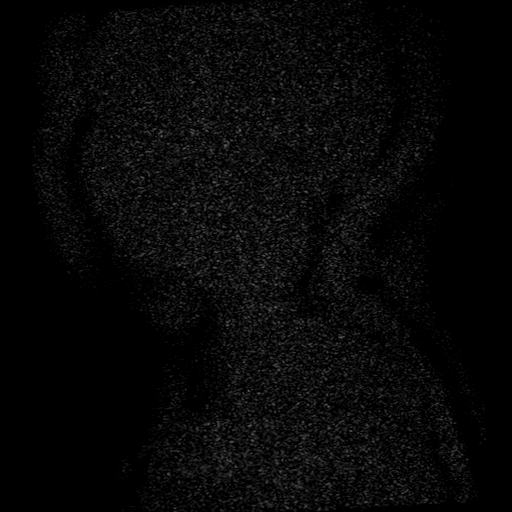
[im 60/360]
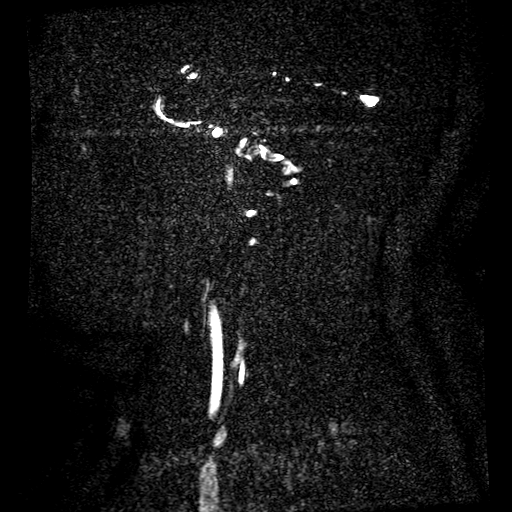
[im 120/360]
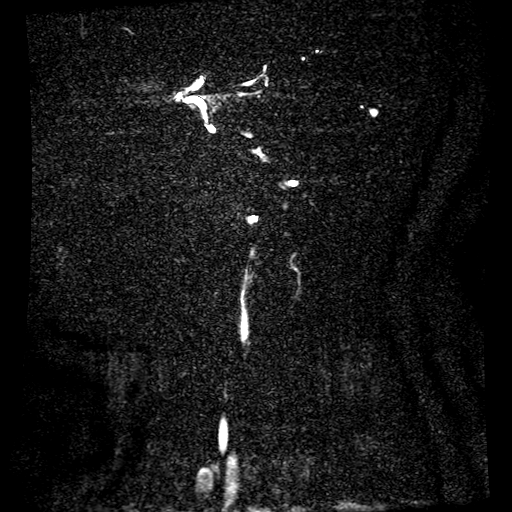
[im 150/360]
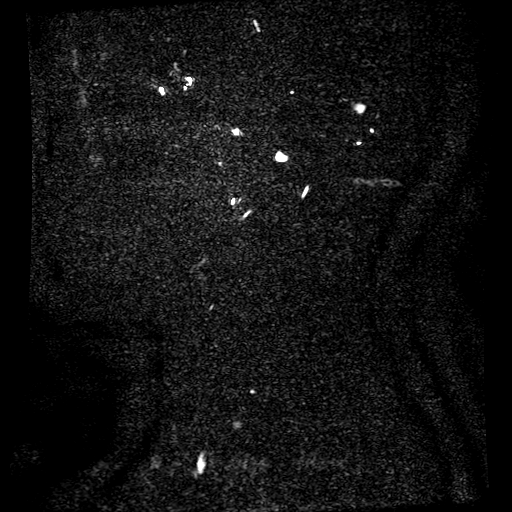
[im 180/360]
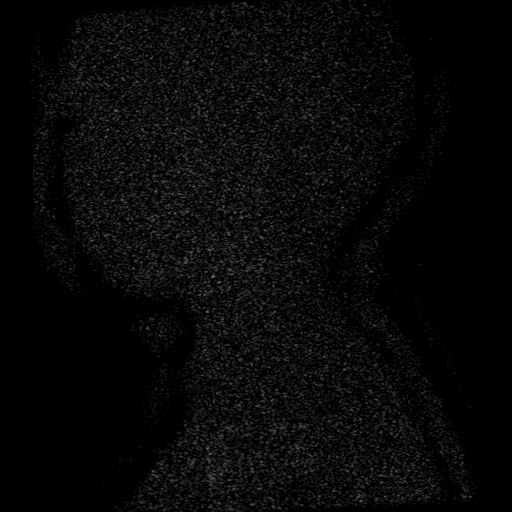
[im 300/360]
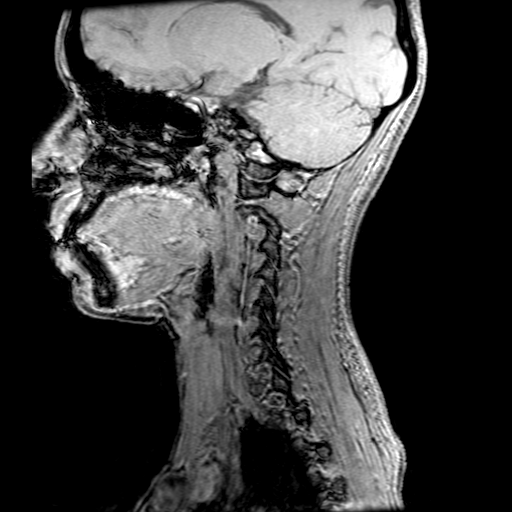

[Series 800: cor cemra ft · coronal · 1.2mm · 0.59mm/px · 3 of 121 slices shown]
[im 1/121]
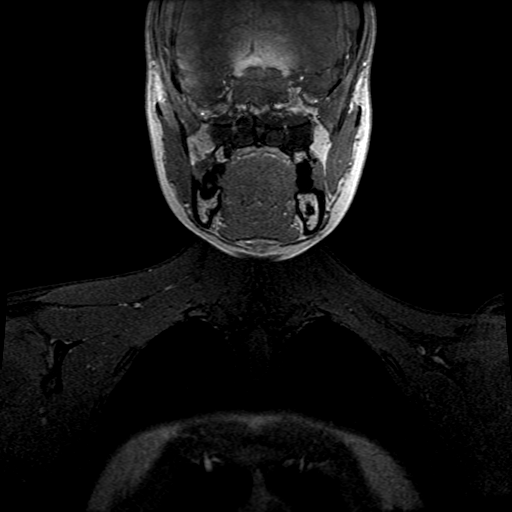
[im 81/121]
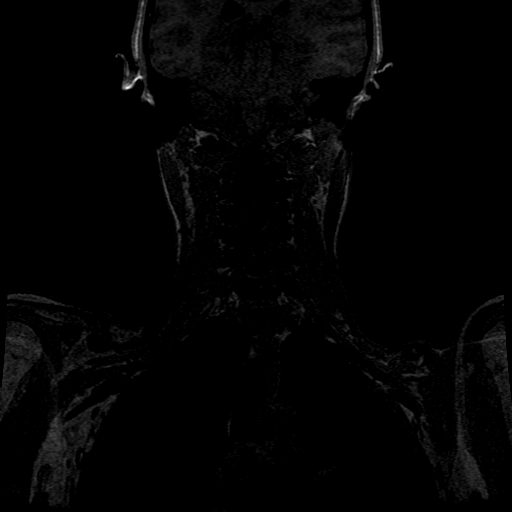
[im 121/121]
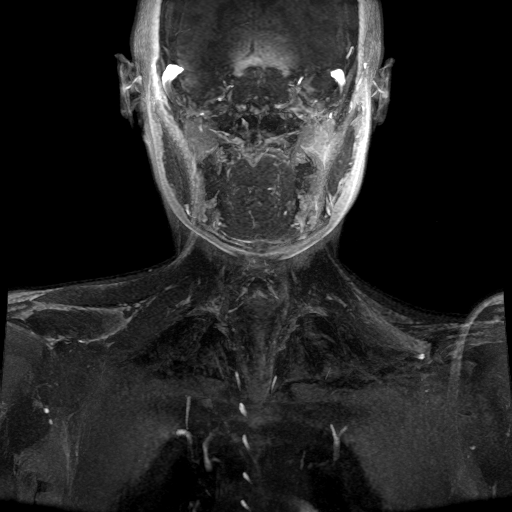

[Series 801: ph1/cor cemra ft · coronal · 1.2mm · 0.59mm/px · 3 of 117 slices shown]
[im 1/117]
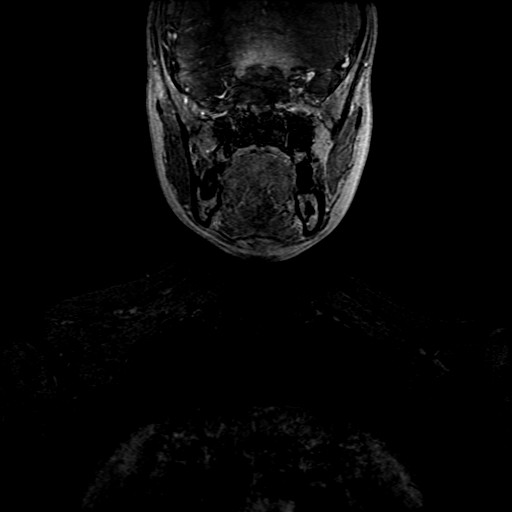
[im 78/117]
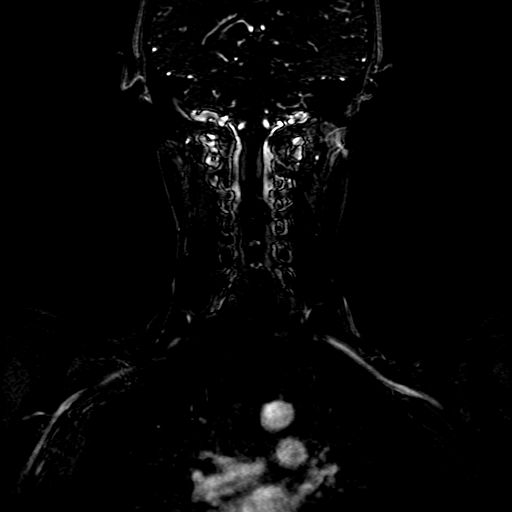
[im 117/117]
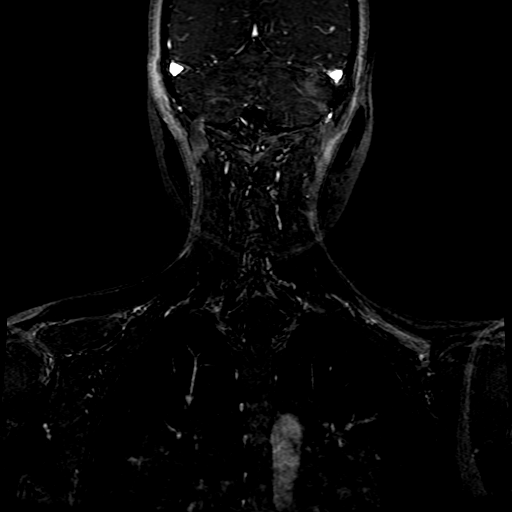

[19 of 48 positions shown; findings below may reference images not displayed]

FINDINGS: MRA NECK FINDINGS

Time-of-flight imaging of the neck shows antegrade flow with carotid
and vertebral arteries. Postcontrast imaging shows a normal diameter
aortic arch. There is 3 vessel arch branching. No proximal
subclavian or brachiocephalic stenosis. As permitted by venous
contamination at level of the V3 segments, the vertebral arteries
are smooth and widely patent. Both carotids are smooth and widely
patent. No incidental mass or inflammation of the neck.

MRA HEAD FINDINGS

Symmetric carotid and vertebral arteries. Incomplete
circle-of-Willis at the left posterior communicating artery.
Incidental proximal basilar fenestration. The left pica and right
aica are dominant.

No branch occlusion, stenosis, or aneurysm. Negative for vessel
beading.
IMPRESSION: Normal MRA of the head and neck.
